# Patient Record
Sex: Female | Born: 1970
Health system: Southern US, Community
[De-identification: ages and names within clinical notes are randomized; demographics above are authoritative.]

## PROBLEM LIST (undated history)

## (undated) ENCOUNTER — Ambulatory Visit: Admission: EM | Payer: No Typology Code available for payment source | Source: Home / Self Care

## (undated) DIAGNOSIS — N809 Endometriosis, unspecified: Secondary | ICD-10-CM

## (undated) DIAGNOSIS — F1010 Alcohol abuse, uncomplicated: Secondary | ICD-10-CM

## (undated) DIAGNOSIS — K219 Gastro-esophageal reflux disease without esophagitis: Secondary | ICD-10-CM

## (undated) DIAGNOSIS — F3281 Premenstrual dysphoric disorder: Secondary | ICD-10-CM

## (undated) DIAGNOSIS — M1990 Unspecified osteoarthritis, unspecified site: Secondary | ICD-10-CM

## (undated) DIAGNOSIS — Z22322 Carrier or suspected carrier of Methicillin resistant Staphylococcus aureus: Secondary | ICD-10-CM

## (undated) DIAGNOSIS — F419 Anxiety disorder, unspecified: Secondary | ICD-10-CM

## (undated) DIAGNOSIS — F319 Bipolar disorder, unspecified: Secondary | ICD-10-CM

## (undated) DIAGNOSIS — J189 Pneumonia, unspecified organism: Secondary | ICD-10-CM

## (undated) DIAGNOSIS — N800 Endometriosis of uterus: Secondary | ICD-10-CM

## (undated) DIAGNOSIS — Z8489 Family history of other specified conditions: Secondary | ICD-10-CM

## (undated) DIAGNOSIS — A749 Chlamydial infection, unspecified: Secondary | ICD-10-CM

## (undated) DIAGNOSIS — N393 Stress incontinence (female) (male): Secondary | ICD-10-CM

## (undated) DIAGNOSIS — N8003 Adenomyosis of the uterus: Secondary | ICD-10-CM

## (undated) DIAGNOSIS — R7303 Prediabetes: Secondary | ICD-10-CM

## (undated) HISTORY — DX: Chlamydial infection, unspecified: A74.9

## (undated) HISTORY — DX: Carrier or suspected carrier of methicillin resistant Staphylococcus aureus: Z22.322

## (undated) HISTORY — DX: Premenstrual dysphoric disorder: F32.81

## (undated) HISTORY — PX: MOHS SURGERY: SUR867

## (undated) HISTORY — DX: Endometriosis of uterus: N80.0

## (undated) HISTORY — DX: Endometriosis, unspecified: N80.9

## (undated) HISTORY — DX: Bipolar disorder, unspecified: F31.9

## (undated) HISTORY — PX: OTHER SURGICAL HISTORY: SHX169

## (undated) HISTORY — DX: Adenomyosis of the uterus: N80.03

## (undated) HISTORY — DX: Stress incontinence (female) (male): N39.3

## (undated) HISTORY — DX: Anxiety disorder, unspecified: F41.9

## (undated) HISTORY — DX: Alcohol abuse, uncomplicated: F10.10

## (undated) HISTORY — PX: NEUROMA SURGERY: SHX722

---

## 1995-11-10 HISTORY — PX: DILATION AND CURETTAGE OF UTERUS: SHX78

## 1998-02-12 ENCOUNTER — Encounter: Admission: RE | Admit: 1998-02-12 | Discharge: 1998-02-12 | Payer: Self-pay | Admitting: Obstetrics & Gynecology

## 1998-02-13 ENCOUNTER — Other Ambulatory Visit: Admission: RE | Admit: 1998-02-13 | Discharge: 1998-02-13 | Payer: Self-pay | Admitting: Obstetrics & Gynecology

## 1998-05-28 ENCOUNTER — Emergency Department (HOSPITAL_COMMUNITY): Admission: EM | Admit: 1998-05-28 | Discharge: 1998-05-28 | Payer: Self-pay | Admitting: Emergency Medicine

## 1998-07-05 ENCOUNTER — Emergency Department (HOSPITAL_COMMUNITY): Admission: EM | Admit: 1998-07-05 | Discharge: 1998-07-05 | Payer: Self-pay | Admitting: Emergency Medicine

## 1998-07-07 ENCOUNTER — Ambulatory Visit (HOSPITAL_COMMUNITY): Admission: RE | Admit: 1998-07-07 | Discharge: 1998-07-07 | Payer: Self-pay | Admitting: Emergency Medicine

## 1998-07-09 ENCOUNTER — Ambulatory Visit (HOSPITAL_COMMUNITY): Admission: RE | Admit: 1998-07-09 | Discharge: 1998-07-09 | Payer: Self-pay | Admitting: Obstetrics & Gynecology

## 1998-07-10 ENCOUNTER — Inpatient Hospital Stay (HOSPITAL_COMMUNITY): Admission: AD | Admit: 1998-07-10 | Discharge: 1998-07-10 | Payer: Self-pay | Admitting: Obstetrics and Gynecology

## 1998-07-11 ENCOUNTER — Ambulatory Visit (HOSPITAL_COMMUNITY): Admission: RE | Admit: 1998-07-11 | Discharge: 1998-07-11 | Payer: Self-pay | Admitting: Obstetrics & Gynecology

## 1998-07-18 ENCOUNTER — Ambulatory Visit (HOSPITAL_COMMUNITY): Admission: RE | Admit: 1998-07-18 | Discharge: 1998-07-18 | Payer: Self-pay | Admitting: Obstetrics & Gynecology

## 1998-07-18 ENCOUNTER — Ambulatory Visit (HOSPITAL_COMMUNITY): Admission: RE | Admit: 1998-07-18 | Discharge: 1998-07-18 | Payer: Self-pay | Admitting: Obstetrics

## 1998-07-29 ENCOUNTER — Ambulatory Visit (HOSPITAL_COMMUNITY): Admission: RE | Admit: 1998-07-29 | Discharge: 1998-07-29 | Payer: Self-pay | Admitting: Obstetrics

## 1998-08-29 ENCOUNTER — Encounter: Admission: RE | Admit: 1998-08-29 | Discharge: 1998-08-29 | Payer: Self-pay | Admitting: Obstetrics

## 1998-08-29 ENCOUNTER — Other Ambulatory Visit: Admission: RE | Admit: 1998-08-29 | Discharge: 1998-08-29 | Payer: Self-pay | Admitting: Obstetrics

## 1998-08-29 ENCOUNTER — Encounter (HOSPITAL_COMMUNITY): Admission: RE | Admit: 1998-08-29 | Discharge: 1998-11-27 | Payer: Self-pay | Admitting: Obstetrics

## 1998-09-25 ENCOUNTER — Encounter: Admission: RE | Admit: 1998-09-25 | Discharge: 1998-09-25 | Payer: Self-pay | Admitting: Obstetrics & Gynecology

## 1998-10-01 ENCOUNTER — Encounter: Admission: RE | Admit: 1998-10-01 | Discharge: 1998-10-01 | Payer: Self-pay | Admitting: Hematology and Oncology

## 1998-10-23 ENCOUNTER — Encounter: Admission: RE | Admit: 1998-10-23 | Discharge: 1998-10-23 | Payer: Self-pay | Admitting: Obstetrics & Gynecology

## 1998-10-23 ENCOUNTER — Ambulatory Visit (HOSPITAL_COMMUNITY): Admission: RE | Admit: 1998-10-23 | Discharge: 1998-10-23 | Payer: Self-pay | Admitting: Obstetrics & Gynecology

## 1998-11-09 HISTORY — PX: OTHER SURGICAL HISTORY: SHX169

## 1998-11-09 HISTORY — PX: LEEP: SHX91

## 1998-11-23 ENCOUNTER — Inpatient Hospital Stay (HOSPITAL_COMMUNITY): Admission: AD | Admit: 1998-11-23 | Discharge: 1998-11-23 | Payer: Self-pay | Admitting: *Deleted

## 1998-11-25 ENCOUNTER — Ambulatory Visit (HOSPITAL_COMMUNITY): Admission: RE | Admit: 1998-11-25 | Discharge: 1998-11-25 | Payer: Self-pay | Admitting: Obstetrics

## 1998-12-25 ENCOUNTER — Encounter: Admission: RE | Admit: 1998-12-25 | Discharge: 1998-12-25 | Payer: Self-pay | Admitting: Obstetrics & Gynecology

## 1999-01-12 ENCOUNTER — Inpatient Hospital Stay (HOSPITAL_COMMUNITY): Admission: AD | Admit: 1999-01-12 | Discharge: 1999-01-12 | Payer: Self-pay | Admitting: Obstetrics & Gynecology

## 1999-01-15 ENCOUNTER — Encounter: Admission: RE | Admit: 1999-01-15 | Discharge: 1999-01-15 | Payer: Self-pay | Admitting: Obstetrics & Gynecology

## 1999-01-22 ENCOUNTER — Encounter: Admission: RE | Admit: 1999-01-22 | Discharge: 1999-01-22 | Payer: Self-pay | Admitting: Obstetrics & Gynecology

## 1999-02-12 ENCOUNTER — Encounter: Admission: RE | Admit: 1999-02-12 | Discharge: 1999-02-12 | Payer: Self-pay | Admitting: Obstetrics & Gynecology

## 1999-02-13 ENCOUNTER — Inpatient Hospital Stay (HOSPITAL_COMMUNITY): Admission: AD | Admit: 1999-02-13 | Discharge: 1999-02-13 | Payer: Self-pay | Admitting: Obstetrics & Gynecology

## 1999-02-19 ENCOUNTER — Encounter: Admission: RE | Admit: 1999-02-19 | Discharge: 1999-02-19 | Payer: Self-pay | Admitting: Obstetrics & Gynecology

## 1999-02-26 ENCOUNTER — Encounter (HOSPITAL_COMMUNITY): Admission: RE | Admit: 1999-02-26 | Discharge: 1999-03-05 | Payer: Self-pay | Admitting: Obstetrics & Gynecology

## 1999-02-26 ENCOUNTER — Encounter: Admission: RE | Admit: 1999-02-26 | Discharge: 1999-02-26 | Payer: Self-pay | Admitting: Obstetrics & Gynecology

## 1999-03-04 ENCOUNTER — Inpatient Hospital Stay (HOSPITAL_COMMUNITY): Admission: AD | Admit: 1999-03-04 | Discharge: 1999-03-07 | Payer: Self-pay | Admitting: *Deleted

## 1999-03-10 ENCOUNTER — Inpatient Hospital Stay (HOSPITAL_COMMUNITY): Admission: AD | Admit: 1999-03-10 | Discharge: 1999-03-10 | Payer: Self-pay | Admitting: Obstetrics

## 1999-07-16 ENCOUNTER — Emergency Department (HOSPITAL_COMMUNITY): Admission: EM | Admit: 1999-07-16 | Discharge: 1999-07-16 | Payer: Self-pay | Admitting: Emergency Medicine

## 1999-07-30 ENCOUNTER — Inpatient Hospital Stay (HOSPITAL_COMMUNITY): Admission: AD | Admit: 1999-07-30 | Discharge: 1999-07-30 | Payer: Self-pay | Admitting: *Deleted

## 1999-07-31 ENCOUNTER — Encounter: Admission: RE | Admit: 1999-07-31 | Discharge: 1999-07-31 | Payer: Self-pay | Admitting: Obstetrics

## 1999-08-25 ENCOUNTER — Ambulatory Visit (HOSPITAL_COMMUNITY): Admission: RE | Admit: 1999-08-25 | Discharge: 1999-08-25 | Payer: Self-pay | Admitting: General Surgery

## 1999-12-10 ENCOUNTER — Encounter: Payer: Self-pay | Admitting: Emergency Medicine

## 1999-12-10 ENCOUNTER — Emergency Department (HOSPITAL_COMMUNITY): Admission: EM | Admit: 1999-12-10 | Discharge: 1999-12-10 | Payer: Self-pay | Admitting: Emergency Medicine

## 1999-12-16 ENCOUNTER — Ambulatory Visit (HOSPITAL_BASED_OUTPATIENT_CLINIC_OR_DEPARTMENT_OTHER): Admission: RE | Admit: 1999-12-16 | Discharge: 1999-12-16 | Payer: Self-pay | Admitting: Orthopedic Surgery

## 2000-04-29 ENCOUNTER — Encounter: Payer: Self-pay | Admitting: Internal Medicine

## 2000-04-29 ENCOUNTER — Ambulatory Visit (HOSPITAL_COMMUNITY): Admission: RE | Admit: 2000-04-29 | Discharge: 2000-04-29 | Payer: Self-pay | Admitting: Internal Medicine

## 2000-05-28 ENCOUNTER — Encounter: Payer: Self-pay | Admitting: Emergency Medicine

## 2000-05-28 ENCOUNTER — Emergency Department (HOSPITAL_COMMUNITY): Admission: EM | Admit: 2000-05-28 | Discharge: 2000-05-28 | Payer: Self-pay | Admitting: Emergency Medicine

## 2000-10-13 ENCOUNTER — Emergency Department (HOSPITAL_COMMUNITY): Admission: EM | Admit: 2000-10-13 | Discharge: 2000-10-14 | Payer: Self-pay | Admitting: Emergency Medicine

## 2000-10-14 ENCOUNTER — Encounter: Payer: Self-pay | Admitting: Emergency Medicine

## 2000-10-14 ENCOUNTER — Emergency Department (HOSPITAL_COMMUNITY): Admission: EM | Admit: 2000-10-14 | Discharge: 2000-10-14 | Payer: Self-pay | Admitting: Internal Medicine

## 2000-10-14 ENCOUNTER — Encounter: Payer: Self-pay | Admitting: Internal Medicine

## 2000-12-10 ENCOUNTER — Encounter: Admission: RE | Admit: 2000-12-10 | Discharge: 2000-12-10 | Payer: Self-pay | Admitting: Obstetrics & Gynecology

## 2000-12-11 ENCOUNTER — Other Ambulatory Visit: Admission: RE | Admit: 2000-12-11 | Discharge: 2000-12-11 | Payer: Self-pay | Admitting: Obstetrics & Gynecology

## 2001-01-25 ENCOUNTER — Encounter (INDEPENDENT_AMBULATORY_CARE_PROVIDER_SITE_OTHER): Payer: Self-pay

## 2001-01-25 ENCOUNTER — Encounter: Payer: Self-pay | Admitting: General Surgery

## 2001-01-25 ENCOUNTER — Ambulatory Visit (HOSPITAL_COMMUNITY): Admission: RE | Admit: 2001-01-25 | Discharge: 2001-01-25 | Payer: Self-pay | Admitting: General Surgery

## 2001-09-07 ENCOUNTER — Emergency Department (HOSPITAL_COMMUNITY): Admission: EM | Admit: 2001-09-07 | Discharge: 2001-09-07 | Payer: Self-pay | Admitting: Emergency Medicine

## 2001-09-07 ENCOUNTER — Encounter: Payer: Self-pay | Admitting: Emergency Medicine

## 2002-01-30 ENCOUNTER — Encounter: Payer: Self-pay | Admitting: *Deleted

## 2002-01-30 ENCOUNTER — Ambulatory Visit (HOSPITAL_COMMUNITY): Admission: RE | Admit: 2002-01-30 | Discharge: 2002-01-30 | Payer: Self-pay | Admitting: *Deleted

## 2002-02-20 ENCOUNTER — Emergency Department (HOSPITAL_COMMUNITY): Admission: AC | Admit: 2002-02-20 | Discharge: 2002-02-20 | Payer: Self-pay

## 2002-02-20 ENCOUNTER — Encounter: Payer: Self-pay | Admitting: General Surgery

## 2002-03-23 ENCOUNTER — Encounter: Payer: Self-pay | Admitting: *Deleted

## 2002-03-23 ENCOUNTER — Ambulatory Visit (HOSPITAL_COMMUNITY): Admission: RE | Admit: 2002-03-23 | Discharge: 2002-03-23 | Payer: Self-pay | Admitting: *Deleted

## 2002-10-05 ENCOUNTER — Emergency Department (HOSPITAL_COMMUNITY): Admission: EM | Admit: 2002-10-05 | Discharge: 2002-10-05 | Payer: Self-pay | Admitting: Emergency Medicine

## 2002-10-05 ENCOUNTER — Encounter: Payer: Self-pay | Admitting: Emergency Medicine

## 2003-01-31 ENCOUNTER — Encounter: Admission: RE | Admit: 2003-01-31 | Discharge: 2003-01-31 | Payer: Self-pay | Admitting: Family Medicine

## 2003-02-09 ENCOUNTER — Other Ambulatory Visit: Admission: RE | Admit: 2003-02-09 | Discharge: 2003-02-09 | Payer: Self-pay | Admitting: Family Medicine

## 2003-02-09 ENCOUNTER — Encounter: Admission: RE | Admit: 2003-02-09 | Discharge: 2003-02-09 | Payer: Self-pay | Admitting: Family Medicine

## 2003-02-23 ENCOUNTER — Ambulatory Visit (HOSPITAL_COMMUNITY): Admission: RE | Admit: 2003-02-23 | Discharge: 2003-02-23 | Payer: Self-pay | Admitting: Family Medicine

## 2003-07-31 ENCOUNTER — Emergency Department (HOSPITAL_COMMUNITY): Admission: EM | Admit: 2003-07-31 | Discharge: 2003-08-01 | Payer: Self-pay | Admitting: Emergency Medicine

## 2003-12-11 ENCOUNTER — Encounter: Admission: RE | Admit: 2003-12-11 | Discharge: 2003-12-11 | Payer: Self-pay | Admitting: Family Medicine

## 2004-01-11 ENCOUNTER — Encounter: Admission: RE | Admit: 2004-01-11 | Discharge: 2004-01-11 | Payer: Self-pay | Admitting: Family Medicine

## 2004-01-18 ENCOUNTER — Encounter: Admission: RE | Admit: 2004-01-18 | Discharge: 2004-01-18 | Payer: Self-pay | Admitting: Sports Medicine

## 2004-03-13 ENCOUNTER — Encounter: Admission: RE | Admit: 2004-03-13 | Discharge: 2004-03-13 | Payer: Self-pay | Admitting: Family Medicine

## 2004-03-26 ENCOUNTER — Emergency Department (HOSPITAL_COMMUNITY): Admission: EM | Admit: 2004-03-26 | Discharge: 2004-03-27 | Payer: Self-pay | Admitting: Emergency Medicine

## 2004-04-15 ENCOUNTER — Encounter: Admission: RE | Admit: 2004-04-15 | Discharge: 2004-04-15 | Payer: Self-pay | Admitting: Sports Medicine

## 2004-04-29 ENCOUNTER — Encounter: Admission: RE | Admit: 2004-04-29 | Discharge: 2004-04-29 | Payer: Self-pay | Admitting: Family Medicine

## 2004-05-13 ENCOUNTER — Encounter: Admission: RE | Admit: 2004-05-13 | Discharge: 2004-05-13 | Payer: Self-pay | Admitting: Sports Medicine

## 2004-05-27 ENCOUNTER — Encounter: Admission: RE | Admit: 2004-05-27 | Discharge: 2004-05-27 | Payer: Self-pay | Admitting: Family Medicine

## 2004-06-24 ENCOUNTER — Encounter: Admission: RE | Admit: 2004-06-24 | Discharge: 2004-06-24 | Payer: Self-pay | Admitting: Family Medicine

## 2004-07-29 ENCOUNTER — Ambulatory Visit: Payer: Self-pay | Admitting: Family Medicine

## 2004-08-12 ENCOUNTER — Ambulatory Visit: Payer: Self-pay | Admitting: Family Medicine

## 2004-08-26 ENCOUNTER — Ambulatory Visit: Payer: Self-pay | Admitting: Family Medicine

## 2004-09-16 ENCOUNTER — Ambulatory Visit: Payer: Self-pay | Admitting: Family Medicine

## 2004-12-10 ENCOUNTER — Ambulatory Visit: Payer: Self-pay | Admitting: Family Medicine

## 2005-03-09 LAB — CONVERTED CEMR LAB: Pap Smear: NORMAL

## 2005-03-17 ENCOUNTER — Ambulatory Visit: Payer: Self-pay | Admitting: Family Medicine

## 2005-03-27 ENCOUNTER — Ambulatory Visit: Payer: Self-pay | Admitting: Family Medicine

## 2005-06-12 ENCOUNTER — Emergency Department (HOSPITAL_COMMUNITY): Admission: EM | Admit: 2005-06-12 | Discharge: 2005-06-13 | Payer: Self-pay | Admitting: Emergency Medicine

## 2005-06-15 ENCOUNTER — Ambulatory Visit: Payer: Self-pay | Admitting: Family Medicine

## 2005-06-15 ENCOUNTER — Emergency Department (HOSPITAL_COMMUNITY): Admission: EM | Admit: 2005-06-15 | Discharge: 2005-06-15 | Payer: Self-pay | Admitting: *Deleted

## 2005-06-17 ENCOUNTER — Ambulatory Visit: Payer: Self-pay | Admitting: Family Medicine

## 2006-05-28 ENCOUNTER — Ambulatory Visit: Payer: Self-pay | Admitting: Family Medicine

## 2006-08-13 ENCOUNTER — Ambulatory Visit: Payer: Self-pay | Admitting: Family Medicine

## 2006-11-17 ENCOUNTER — Ambulatory Visit: Payer: Self-pay | Admitting: Family Medicine

## 2006-11-17 ENCOUNTER — Emergency Department (HOSPITAL_COMMUNITY): Admission: EM | Admit: 2006-11-17 | Discharge: 2006-11-17 | Payer: Self-pay | Admitting: Emergency Medicine

## 2006-12-08 ENCOUNTER — Ambulatory Visit: Payer: Self-pay | Admitting: Family Medicine

## 2006-12-15 ENCOUNTER — Ambulatory Visit: Payer: Self-pay | Admitting: Family Medicine

## 2006-12-29 ENCOUNTER — Ambulatory Visit: Payer: Self-pay | Admitting: Family Medicine

## 2007-01-06 DIAGNOSIS — K219 Gastro-esophageal reflux disease without esophagitis: Secondary | ICD-10-CM

## 2007-01-06 DIAGNOSIS — N938 Other specified abnormal uterine and vaginal bleeding: Secondary | ICD-10-CM | POA: Insufficient documentation

## 2007-01-06 DIAGNOSIS — F410 Panic disorder [episodic paroxysmal anxiety] without agoraphobia: Secondary | ICD-10-CM | POA: Insufficient documentation

## 2007-01-06 DIAGNOSIS — K2210 Ulcer of esophagus without bleeding: Secondary | ICD-10-CM | POA: Insufficient documentation

## 2007-01-06 DIAGNOSIS — N949 Unspecified condition associated with female genital organs and menstrual cycle: Secondary | ICD-10-CM

## 2007-01-06 DIAGNOSIS — F411 Generalized anxiety disorder: Secondary | ICD-10-CM | POA: Insufficient documentation

## 2007-01-06 DIAGNOSIS — L719 Rosacea, unspecified: Secondary | ICD-10-CM | POA: Insufficient documentation

## 2007-01-26 ENCOUNTER — Ambulatory Visit: Payer: Self-pay | Admitting: Sports Medicine

## 2007-01-26 DIAGNOSIS — F319 Bipolar disorder, unspecified: Secondary | ICD-10-CM | POA: Insufficient documentation

## 2007-02-10 ENCOUNTER — Telehealth: Payer: Self-pay | Admitting: *Deleted

## 2007-02-10 ENCOUNTER — Ambulatory Visit: Payer: Self-pay | Admitting: Family Medicine

## 2007-02-10 ENCOUNTER — Encounter (INDEPENDENT_AMBULATORY_CARE_PROVIDER_SITE_OTHER): Payer: Self-pay | Admitting: Family Medicine

## 2007-02-10 LAB — CONVERTED CEMR LAB
ALT: 15 units/L (ref 0–35)
AST: 29 units/L (ref 0–37)
Albumin: 4.9 g/dL (ref 3.5–5.2)
Alkaline Phosphatase: 42 units/L (ref 39–117)
Basophils Absolute: 0 10*3/uL (ref 0.0–0.1)
CO2: 23 meq/L (ref 19–32)
Calcium: 9.4 mg/dL (ref 8.4–10.5)
Lymphocytes Relative: 27 % (ref 12–46)
Neutro Abs: 5 10*3/uL (ref 1.7–7.7)
Protein, U semiquant: NEGATIVE
RDW: 12.9 % (ref 11.5–14.0)
Sodium: 140 meq/L (ref 135–145)
Specific Gravity, Urine: 1.025
Total Protein: 7.3 g/dL (ref 6.0–8.3)
pH: 5.5

## 2007-02-11 ENCOUNTER — Encounter (INDEPENDENT_AMBULATORY_CARE_PROVIDER_SITE_OTHER): Payer: Self-pay | Admitting: Family Medicine

## 2007-02-11 ENCOUNTER — Telehealth (INDEPENDENT_AMBULATORY_CARE_PROVIDER_SITE_OTHER): Payer: Self-pay | Admitting: Family Medicine

## 2007-02-11 LAB — CONVERTED CEMR LAB: Amylase: 35 units/L (ref 0–105)

## 2007-02-14 ENCOUNTER — Encounter (INDEPENDENT_AMBULATORY_CARE_PROVIDER_SITE_OTHER): Payer: Self-pay | Admitting: Family Medicine

## 2007-02-14 ENCOUNTER — Ambulatory Visit (HOSPITAL_COMMUNITY): Admission: RE | Admit: 2007-02-14 | Discharge: 2007-02-14 | Payer: Self-pay | Admitting: Family Medicine

## 2007-02-15 ENCOUNTER — Telehealth (INDEPENDENT_AMBULATORY_CARE_PROVIDER_SITE_OTHER): Payer: Self-pay | Admitting: Family Medicine

## 2007-02-17 ENCOUNTER — Telehealth: Payer: Self-pay | Admitting: *Deleted

## 2007-02-18 ENCOUNTER — Ambulatory Visit: Payer: Self-pay | Admitting: Family Medicine

## 2007-02-22 ENCOUNTER — Emergency Department (HOSPITAL_COMMUNITY): Admission: EM | Admit: 2007-02-22 | Discharge: 2007-02-22 | Payer: Self-pay | Admitting: Family Medicine

## 2007-03-11 ENCOUNTER — Ambulatory Visit: Payer: Self-pay | Admitting: Family Medicine

## 2007-03-11 ENCOUNTER — Telehealth: Payer: Self-pay | Admitting: *Deleted

## 2007-03-28 ENCOUNTER — Telehealth (INDEPENDENT_AMBULATORY_CARE_PROVIDER_SITE_OTHER): Payer: Self-pay | Admitting: Family Medicine

## 2007-03-30 ENCOUNTER — Telehealth: Payer: Self-pay | Admitting: *Deleted

## 2007-04-06 ENCOUNTER — Ambulatory Visit: Payer: Self-pay | Admitting: Family Medicine

## 2007-04-08 ENCOUNTER — Encounter: Payer: Self-pay | Admitting: Family Medicine

## 2007-04-08 ENCOUNTER — Encounter: Payer: Self-pay | Admitting: Internal Medicine

## 2007-04-08 ENCOUNTER — Ambulatory Visit: Payer: Self-pay | Admitting: Family Medicine

## 2007-04-08 LAB — CONVERTED CEMR LAB
Bilirubin Urine: NEGATIVE
Glucose, Urine, Semiquant: NEGATIVE
Nitrite: NEGATIVE
Protein, U semiquant: NEGATIVE
Specific Gravity, Urine: 1.025
Urobilinogen, UA: NEGATIVE
WBC Urine, dipstick: NEGATIVE

## 2007-04-15 ENCOUNTER — Encounter (INDEPENDENT_AMBULATORY_CARE_PROVIDER_SITE_OTHER): Payer: Self-pay | Admitting: *Deleted

## 2007-07-08 ENCOUNTER — Ambulatory Visit: Payer: Self-pay | Admitting: Family Medicine

## 2007-07-08 DIAGNOSIS — G450 Vertebro-basilar artery syndrome: Secondary | ICD-10-CM | POA: Insufficient documentation

## 2007-07-08 LAB — CONVERTED CEMR LAB
Bilirubin Urine: NEGATIVE
Glucose, Urine, Semiquant: NEGATIVE
Nitrite: NEGATIVE
Specific Gravity, Urine: >=1.03
WBC Urine, dipstick: NEGATIVE
pH: 6

## 2007-07-15 ENCOUNTER — Ambulatory Visit: Payer: Self-pay | Admitting: Family Medicine

## 2007-07-18 ENCOUNTER — Encounter: Payer: Self-pay | Admitting: Family Medicine

## 2007-07-20 ENCOUNTER — Encounter: Payer: Self-pay | Admitting: Family Medicine

## 2007-07-22 ENCOUNTER — Encounter: Payer: Self-pay | Admitting: Family Medicine

## 2007-08-08 ENCOUNTER — Ambulatory Visit: Payer: Self-pay | Admitting: Family Medicine

## 2007-08-08 ENCOUNTER — Ambulatory Visit: Payer: Self-pay | Admitting: Gynecology

## 2007-08-08 LAB — CONVERTED CEMR LAB: Beta hcg, urine, semiquantitative: NEGATIVE

## 2007-08-10 ENCOUNTER — Ambulatory Visit: Payer: Self-pay | Admitting: Family Medicine

## 2007-08-12 ENCOUNTER — Ambulatory Visit (HOSPITAL_COMMUNITY): Admission: RE | Admit: 2007-08-12 | Discharge: 2007-08-12 | Payer: Self-pay | Admitting: Gynecology

## 2007-08-22 ENCOUNTER — Telehealth: Payer: Self-pay | Admitting: Family Medicine

## 2007-08-30 ENCOUNTER — Encounter: Payer: Self-pay | Admitting: Psychology

## 2007-09-13 ENCOUNTER — Telehealth: Payer: Self-pay | Admitting: Psychology

## 2007-09-14 ENCOUNTER — Emergency Department: Payer: Self-pay | Admitting: Emergency Medicine

## 2007-09-16 ENCOUNTER — Ambulatory Visit: Payer: Self-pay | Admitting: Internal Medicine

## 2007-09-16 DIAGNOSIS — G500 Trigeminal neuralgia: Secondary | ICD-10-CM | POA: Insufficient documentation

## 2007-10-12 ENCOUNTER — Ambulatory Visit: Payer: Self-pay | Admitting: Family Medicine

## 2007-11-06 ENCOUNTER — Emergency Department (HOSPITAL_COMMUNITY): Admission: EM | Admit: 2007-11-06 | Discharge: 2007-11-06 | Payer: Self-pay | Admitting: Emergency Medicine

## 2007-11-10 HISTORY — PX: ABDOMINAL HYSTERECTOMY: SHX81

## 2007-11-19 ENCOUNTER — Emergency Department (HOSPITAL_COMMUNITY): Admission: EM | Admit: 2007-11-19 | Discharge: 2007-11-19 | Payer: Self-pay | Admitting: Emergency Medicine

## 2007-11-25 ENCOUNTER — Ambulatory Visit: Payer: Self-pay | Admitting: Family Medicine

## 2007-11-29 LAB — CONVERTED CEMR LAB
BUN: 11 mg/dL (ref 6–23)
CO2: 28 meq/L (ref 19–32)
Calcium: 9.1 mg/dL (ref 8.4–10.5)
Chloride: 108 meq/L (ref 96–112)
Cholesterol: 134 mg/dL (ref 0–200)
Creatinine, Ser: 0.9 mg/dL (ref 0.4–1.2)
Glucose, Bld: 84 mg/dL (ref 70–99)
Potassium: 4.4 meq/L (ref 3.5–5.1)
Triglycerides: 33 mg/dL (ref 0–149)
VLDL: 7 mg/dL (ref 0–40)

## 2007-11-30 ENCOUNTER — Ambulatory Visit: Payer: Self-pay | Admitting: Psychology

## 2007-11-30 ENCOUNTER — Telehealth: Payer: Self-pay | Admitting: Family Medicine

## 2007-12-08 ENCOUNTER — Encounter: Payer: Self-pay | Admitting: Family Medicine

## 2007-12-22 ENCOUNTER — Ambulatory Visit: Payer: Self-pay | Admitting: Family Medicine

## 2007-12-22 LAB — CONVERTED CEMR LAB: Nitrite: NEGATIVE

## 2007-12-23 ENCOUNTER — Encounter: Payer: Self-pay | Admitting: Family Medicine

## 2007-12-28 ENCOUNTER — Telehealth: Payer: Self-pay | Admitting: Psychology

## 2008-01-07 ENCOUNTER — Encounter: Payer: Self-pay | Admitting: Family Medicine

## 2008-01-17 ENCOUNTER — Telehealth: Payer: Self-pay | Admitting: Family Medicine

## 2008-01-26 ENCOUNTER — Telehealth: Payer: Self-pay | Admitting: Family Medicine

## 2008-01-30 ENCOUNTER — Emergency Department (HOSPITAL_COMMUNITY): Admission: EM | Admit: 2008-01-30 | Discharge: 2008-01-30 | Payer: Self-pay | Admitting: Emergency Medicine

## 2008-02-15 ENCOUNTER — Telehealth: Payer: Self-pay | Admitting: Psychology

## 2008-03-09 ENCOUNTER — Telehealth: Payer: Self-pay | Admitting: Family Medicine

## 2008-03-15 ENCOUNTER — Encounter: Payer: Self-pay | Admitting: Family Medicine

## 2008-03-25 ENCOUNTER — Emergency Department (HOSPITAL_COMMUNITY): Admission: EM | Admit: 2008-03-25 | Discharge: 2008-03-25 | Payer: Self-pay | Admitting: Family Medicine

## 2008-03-26 ENCOUNTER — Telehealth: Payer: Self-pay | Admitting: Family Medicine

## 2008-03-28 ENCOUNTER — Ambulatory Visit: Payer: Self-pay | Admitting: Family Medicine

## 2008-03-28 LAB — CONVERTED CEMR LAB
Beta hcg, urine, semiquantitative: NEGATIVE
Blood in Urine, dipstick: NEGATIVE
Glucose, Urine, Semiquant: NEGATIVE
Ketones, urine, test strip: NEGATIVE
Nitrite: NEGATIVE
Specific Gravity, Urine: 1.02
WBC Urine, dipstick: NEGATIVE
pH: 7

## 2008-03-29 ENCOUNTER — Emergency Department: Payer: Self-pay | Admitting: Emergency Medicine

## 2008-04-11 ENCOUNTER — Ambulatory Visit: Payer: Self-pay | Admitting: Family Medicine

## 2008-04-12 ENCOUNTER — Telehealth: Payer: Self-pay | Admitting: Family Medicine

## 2008-04-16 ENCOUNTER — Telehealth: Payer: Self-pay | Admitting: Family Medicine

## 2008-04-16 ENCOUNTER — Encounter: Payer: Self-pay | Admitting: Family Medicine

## 2008-04-17 ENCOUNTER — Encounter: Payer: Self-pay | Admitting: Family Medicine

## 2008-04-20 ENCOUNTER — Ambulatory Visit: Payer: Self-pay | Admitting: Family Medicine

## 2008-04-21 ENCOUNTER — Encounter: Payer: Self-pay | Admitting: Family Medicine

## 2008-04-30 ENCOUNTER — Ambulatory Visit: Payer: Self-pay | Admitting: Gynecology

## 2008-05-07 ENCOUNTER — Ambulatory Visit: Payer: Self-pay | Admitting: Family Medicine

## 2008-05-07 ENCOUNTER — Encounter (INDEPENDENT_AMBULATORY_CARE_PROVIDER_SITE_OTHER): Payer: Self-pay | Admitting: *Deleted

## 2008-05-07 LAB — CONVERTED CEMR LAB: Rapid Strep: NEGATIVE

## 2008-05-29 ENCOUNTER — Emergency Department (HOSPITAL_COMMUNITY): Admission: EM | Admit: 2008-05-29 | Discharge: 2008-05-30 | Payer: Self-pay | Admitting: Emergency Medicine

## 2008-08-07 ENCOUNTER — Telehealth: Payer: Self-pay | Admitting: Psychology

## 2008-08-20 ENCOUNTER — Emergency Department (HOSPITAL_COMMUNITY): Admission: EM | Admit: 2008-08-20 | Discharge: 2008-08-20 | Payer: Self-pay | Admitting: Emergency Medicine

## 2008-09-18 ENCOUNTER — Ambulatory Visit: Payer: Self-pay | Admitting: Obstetrics and Gynecology

## 2008-10-09 ENCOUNTER — Ambulatory Visit: Payer: Self-pay | Admitting: Obstetrics and Gynecology

## 2008-10-11 ENCOUNTER — Emergency Department: Payer: Self-pay | Admitting: Internal Medicine

## 2008-10-25 ENCOUNTER — Encounter (INDEPENDENT_AMBULATORY_CARE_PROVIDER_SITE_OTHER): Payer: Self-pay | Admitting: *Deleted

## 2008-11-21 ENCOUNTER — Ambulatory Visit: Payer: Self-pay | Admitting: Family Medicine

## 2008-11-24 ENCOUNTER — Ambulatory Visit: Payer: Self-pay | Admitting: *Deleted

## 2008-11-24 ENCOUNTER — Telehealth (INDEPENDENT_AMBULATORY_CARE_PROVIDER_SITE_OTHER): Payer: Self-pay | Admitting: *Deleted

## 2008-11-24 DIAGNOSIS — A4902 Methicillin resistant Staphylococcus aureus infection, unspecified site: Secondary | ICD-10-CM | POA: Insufficient documentation

## 2008-11-28 ENCOUNTER — Emergency Department: Payer: Self-pay

## 2008-11-29 ENCOUNTER — Telehealth: Payer: Self-pay | Admitting: Family Medicine

## 2008-12-06 ENCOUNTER — Telehealth: Payer: Self-pay | Admitting: Family Medicine

## 2008-12-07 ENCOUNTER — Ambulatory Visit: Payer: Self-pay | Admitting: Family Medicine

## 2008-12-07 LAB — CONVERTED CEMR LAB
Bilirubin Urine: NEGATIVE
Urobilinogen, UA: 0.2

## 2008-12-12 ENCOUNTER — Ambulatory Visit: Payer: Self-pay | Admitting: Family Medicine

## 2009-01-26 ENCOUNTER — Emergency Department (HOSPITAL_COMMUNITY): Admission: EM | Admit: 2009-01-26 | Discharge: 2009-01-26 | Payer: Self-pay | Admitting: Emergency Medicine

## 2009-01-29 ENCOUNTER — Ambulatory Visit: Payer: Self-pay | Admitting: Family Medicine

## 2009-01-29 DIAGNOSIS — G43019 Migraine without aura, intractable, without status migrainosus: Secondary | ICD-10-CM | POA: Insufficient documentation

## 2009-02-12 ENCOUNTER — Encounter: Payer: Self-pay | Admitting: Family Medicine

## 2009-02-15 ENCOUNTER — Encounter: Admission: RE | Admit: 2009-02-15 | Discharge: 2009-02-15 | Payer: Self-pay | Admitting: Psychiatry

## 2009-02-27 ENCOUNTER — Emergency Department (HOSPITAL_COMMUNITY): Admission: EM | Admit: 2009-02-27 | Discharge: 2009-02-27 | Payer: Self-pay | Admitting: Family Medicine

## 2009-05-09 ENCOUNTER — Emergency Department (HOSPITAL_COMMUNITY): Admission: EM | Admit: 2009-05-09 | Discharge: 2009-05-09 | Payer: Self-pay | Admitting: Family Medicine

## 2009-08-02 ENCOUNTER — Ambulatory Visit: Payer: Self-pay | Admitting: Family Medicine

## 2009-09-02 ENCOUNTER — Ambulatory Visit: Payer: Self-pay | Admitting: Family Medicine

## 2009-12-03 ENCOUNTER — Telehealth: Payer: Self-pay | Admitting: Family Medicine

## 2009-12-08 ENCOUNTER — Emergency Department: Payer: Self-pay | Admitting: Unknown Physician Specialty

## 2009-12-11 ENCOUNTER — Ambulatory Visit: Payer: Self-pay | Admitting: Family Medicine

## 2009-12-17 ENCOUNTER — Ambulatory Visit: Payer: Self-pay | Admitting: Family Medicine

## 2009-12-18 LAB — CONVERTED CEMR LAB
AST: 22 units/L (ref 0–37)
Alkaline Phosphatase: 45 units/L (ref 39–117)
Basophils Absolute: 0.1 10*3/uL (ref 0.0–0.1)
Bilirubin, Direct: 0 mg/dL (ref 0.0–0.3)
Calcium: 8.9 mg/dL (ref 8.4–10.5)
Chloride: 106 meq/L (ref 96–112)
Creatinine, Ser: 0.7 mg/dL (ref 0.4–1.2)
Glucose, Bld: 92 mg/dL (ref 70–99)
HCT: 39.4 % (ref 36.0–46.0)
Hemoglobin: 13.3 g/dL (ref 12.0–15.0)
Lymphs Abs: 2 10*3/uL (ref 0.7–4.0)
Monocytes Absolute: 0.5 10*3/uL (ref 0.1–1.0)
Monocytes Relative: 7 % (ref 3.0–12.0)
Neutro Abs: 4 10*3/uL (ref 1.4–7.7)
Neutrophils Relative %: 59.8 % (ref 43.0–77.0)
Platelets: 283 10*3/uL (ref 150.0–400.0)
Potassium: 4.1 meq/L (ref 3.5–5.1)
RDW: 12.3 % (ref 11.5–14.6)
Sodium: 141 meq/L (ref 135–145)
Total Bilirubin: 0.5 mg/dL (ref 0.3–1.2)

## 2009-12-26 ENCOUNTER — Telehealth: Payer: Self-pay | Admitting: Family Medicine

## 2010-01-08 ENCOUNTER — Encounter (INDEPENDENT_AMBULATORY_CARE_PROVIDER_SITE_OTHER): Payer: Self-pay | Admitting: *Deleted

## 2010-01-08 ENCOUNTER — Ambulatory Visit: Payer: Self-pay | Admitting: Family Medicine

## 2010-01-09 ENCOUNTER — Encounter: Payer: Self-pay | Admitting: Family Medicine

## 2010-01-09 ENCOUNTER — Ambulatory Visit: Payer: Self-pay | Admitting: Family Medicine

## 2010-01-13 ENCOUNTER — Encounter: Payer: Self-pay | Admitting: Family Medicine

## 2010-01-13 DIAGNOSIS — D239 Other benign neoplasm of skin, unspecified: Secondary | ICD-10-CM

## 2010-01-13 HISTORY — DX: Other benign neoplasm of skin, unspecified: D23.9

## 2010-01-16 ENCOUNTER — Telehealth: Payer: Self-pay | Admitting: Family Medicine

## 2010-01-23 ENCOUNTER — Ambulatory Visit: Payer: Self-pay | Admitting: Otolaryngology

## 2010-01-27 ENCOUNTER — Ambulatory Visit: Payer: Self-pay | Admitting: Family Medicine

## 2010-02-03 ENCOUNTER — Telehealth: Payer: Self-pay | Admitting: Family Medicine

## 2010-03-10 ENCOUNTER — Emergency Department: Payer: Self-pay | Admitting: Emergency Medicine

## 2010-03-10 ENCOUNTER — Encounter: Payer: Self-pay | Admitting: Family Medicine

## 2010-04-09 ENCOUNTER — Telehealth: Payer: Self-pay | Admitting: Family Medicine

## 2010-04-18 ENCOUNTER — Telehealth: Payer: Self-pay | Admitting: Family Medicine

## 2010-04-22 ENCOUNTER — Telehealth: Payer: Self-pay | Admitting: Family Medicine

## 2010-04-23 ENCOUNTER — Emergency Department (HOSPITAL_COMMUNITY): Admission: EM | Admit: 2010-04-23 | Discharge: 2010-04-23 | Payer: Self-pay | Admitting: Family Medicine

## 2010-04-28 ENCOUNTER — Telehealth: Payer: Self-pay | Admitting: Family Medicine

## 2010-05-07 ENCOUNTER — Ambulatory Visit: Payer: Self-pay | Admitting: Psychology

## 2010-05-13 ENCOUNTER — Encounter: Payer: Self-pay | Admitting: Family Medicine

## 2010-05-14 ENCOUNTER — Emergency Department (HOSPITAL_COMMUNITY): Admission: EM | Admit: 2010-05-14 | Discharge: 2010-05-14 | Payer: Self-pay | Admitting: Emergency Medicine

## 2010-05-16 ENCOUNTER — Ambulatory Visit: Payer: Self-pay | Admitting: Family Medicine

## 2010-05-20 ENCOUNTER — Ambulatory Visit: Payer: Self-pay | Admitting: Psychology

## 2010-06-03 ENCOUNTER — Ambulatory Visit: Payer: Self-pay | Admitting: Psychology

## 2010-06-03 ENCOUNTER — Telehealth: Payer: Self-pay | Admitting: Family Medicine

## 2010-06-06 ENCOUNTER — Encounter: Payer: Self-pay | Admitting: Family Medicine

## 2010-06-10 ENCOUNTER — Ambulatory Visit: Payer: Self-pay | Admitting: Psychology

## 2010-06-20 ENCOUNTER — Encounter: Payer: Self-pay | Admitting: Family Medicine

## 2010-06-20 ENCOUNTER — Ambulatory Visit: Payer: Self-pay | Admitting: Gastroenterology

## 2010-06-24 ENCOUNTER — Ambulatory Visit: Payer: Self-pay | Admitting: Psychology

## 2010-06-30 ENCOUNTER — Encounter: Payer: Self-pay | Admitting: Family Medicine

## 2010-07-05 ENCOUNTER — Ambulatory Visit: Payer: Self-pay | Admitting: Internal Medicine

## 2010-07-07 ENCOUNTER — Ambulatory Visit: Payer: Self-pay | Admitting: Family Medicine

## 2010-07-07 LAB — CONVERTED CEMR LAB
Glucose, Urine, Semiquant: NEGATIVE
pH: 5

## 2010-07-08 ENCOUNTER — Telehealth: Payer: Self-pay | Admitting: Family Medicine

## 2010-07-10 ENCOUNTER — Telehealth: Payer: Self-pay | Admitting: Family Medicine

## 2010-07-11 ENCOUNTER — Ambulatory Visit: Payer: Self-pay | Admitting: Family Medicine

## 2010-07-19 ENCOUNTER — Emergency Department: Payer: Self-pay | Admitting: Emergency Medicine

## 2010-07-21 ENCOUNTER — Telehealth: Payer: Self-pay | Admitting: Family Medicine

## 2010-07-21 ENCOUNTER — Emergency Department (HOSPITAL_COMMUNITY): Admission: EM | Admit: 2010-07-21 | Discharge: 2010-07-21 | Payer: Self-pay | Admitting: Family Medicine

## 2010-08-01 ENCOUNTER — Ambulatory Visit: Payer: Self-pay | Admitting: Family Medicine

## 2010-08-05 ENCOUNTER — Telehealth: Payer: Self-pay | Admitting: Family Medicine

## 2010-08-05 ENCOUNTER — Ambulatory Visit: Payer: Self-pay | Admitting: Psychology

## 2010-08-09 ENCOUNTER — Emergency Department (HOSPITAL_COMMUNITY): Admission: EM | Admit: 2010-08-09 | Discharge: 2010-08-10 | Payer: Self-pay | Admitting: Emergency Medicine

## 2010-08-11 ENCOUNTER — Telehealth: Payer: Self-pay | Admitting: Family Medicine

## 2010-08-12 ENCOUNTER — Ambulatory Visit: Payer: Self-pay | Admitting: Psychology

## 2010-08-19 ENCOUNTER — Ambulatory Visit: Payer: Self-pay

## 2010-08-19 ENCOUNTER — Ambulatory Visit: Payer: Self-pay | Admitting: Family Medicine

## 2010-08-19 ENCOUNTER — Telehealth: Payer: Self-pay | Admitting: Family Medicine

## 2010-08-19 LAB — CONVERTED CEMR LAB
Bilirubin Urine: NEGATIVE
Protein, U semiquant: NEGATIVE
Specific Gravity, Urine: 1.01
pH: 6

## 2010-08-20 ENCOUNTER — Encounter: Payer: Self-pay | Admitting: Family Medicine

## 2010-08-22 ENCOUNTER — Telehealth: Payer: Self-pay | Admitting: Family Medicine

## 2010-08-28 ENCOUNTER — Telehealth: Payer: Self-pay | Admitting: Psychology

## 2010-09-20 ENCOUNTER — Ambulatory Visit: Payer: Self-pay | Admitting: Family Medicine

## 2010-09-20 LAB — CONVERTED CEMR LAB
Glucose, Urine, Semiquant: NEGATIVE
Specific Gravity, Urine: 1.015

## 2010-10-04 ENCOUNTER — Emergency Department (HOSPITAL_COMMUNITY): Admission: EM | Admit: 2010-10-04 | Discharge: 2010-10-04 | Payer: Self-pay | Admitting: Family Medicine

## 2010-10-10 ENCOUNTER — Telehealth: Payer: Self-pay | Admitting: Family Medicine

## 2010-10-10 ENCOUNTER — Ambulatory Visit: Payer: Self-pay | Admitting: Surgery

## 2010-10-22 ENCOUNTER — Ambulatory Visit: Payer: Self-pay | Admitting: Surgery

## 2010-10-29 ENCOUNTER — Emergency Department: Payer: Self-pay | Admitting: Emergency Medicine

## 2010-11-21 ENCOUNTER — Ambulatory Visit
Admission: RE | Admit: 2010-11-21 | Discharge: 2010-11-21 | Payer: Self-pay | Source: Home / Self Care | Attending: Family Medicine | Admitting: Family Medicine

## 2010-11-21 DIAGNOSIS — J309 Allergic rhinitis, unspecified: Secondary | ICD-10-CM | POA: Insufficient documentation

## 2010-11-21 DIAGNOSIS — E669 Obesity, unspecified: Secondary | ICD-10-CM | POA: Insufficient documentation

## 2010-11-26 ENCOUNTER — Ambulatory Visit
Admission: RE | Admit: 2010-11-26 | Discharge: 2010-11-26 | Payer: Self-pay | Source: Home / Self Care | Attending: Family Medicine | Admitting: Family Medicine

## 2010-11-26 DIAGNOSIS — M79609 Pain in unspecified limb: Secondary | ICD-10-CM | POA: Insufficient documentation

## 2010-11-26 DIAGNOSIS — R0789 Other chest pain: Secondary | ICD-10-CM | POA: Insufficient documentation

## 2010-11-27 ENCOUNTER — Telehealth: Payer: Self-pay | Admitting: Family Medicine

## 2010-11-28 ENCOUNTER — Encounter: Payer: Self-pay | Admitting: Family Medicine

## 2010-12-09 ENCOUNTER — Encounter: Payer: Self-pay | Admitting: Family Medicine

## 2010-12-11 NOTE — Progress Notes (Signed)
Summary: GI Referral  Phone Note Call from Patient Call back at Home Phone [phone]   Caller: Patient Call For: Dr. Patsy Lager Summary of Call: Patient is requesting a GI referral.  She has been to the ER (?[facility]) and they suggested that she see GI.  She wanted to see someone in [place] but has to wait 6-8 weeks for an appointment.  She is willing now to see a [place] [facility] MD.  Please send referral. Initial call taken by: Delilah Shan CMA Duncan Dull),  April 28, 2010 11:28 AM  Follow-up for Phone Call        Why does she want to see GI? I have to have a reason to make consult Follow-up by: Hannah Beat MD,  April 28, 2010 11:38 AM  Additional Follow-up for Phone Call Additional follow up Details #1::        I have faxed for ER notes.  I think she went to [facility].  Hopefully, they will arrive soon.  I think it is GERD.Lugene Fuquay CMA Duncan Dull)  April 28, 2010 11:40 AM   New Problems: GERD (ICD-530.81) NAUSEA (ICD-787.02)   Additional Follow-up for Phone Call Additional follow up Details #2::    Heather - please ask the patient herself why she wants GI consult and what is going on.     Patient symptoms are alot of pain in stomach, loss of appetite, nausous, vomiting, throat and neck burning.  Patient went to urgent care and they told her she needed to follow up with GI.Consuello Masse CMA    New Problems: GERD (ICD-530.81) NAUSEA (ICD-787.02)

## 2010-12-11 NOTE — Miscellaneous (Signed)
  Clinical Lists Changes  Orders: Added new Service order of TD Toxoids IM 7 YR + (16109) - Signed Added new Service order of Admin 1st Vaccine (60454) - Signed Observations: Added new observation of TD BOOST VIS: 09/27/07 version given January 08, 2010. (01/08/2010 16:05) Added new observation of TD BOOSTERLO: [id number] (01/08/2010 16:05) Added new observation of TD BOOST EXP: 01/04/2012 (01/08/2010 16:05) Added new observation of TD BOOSTERBY: Heather Woodard CMA (AAMA) (01/08/2010 16:05) Added new observation of TD BOOSTERRT: IM (01/08/2010 16:05) Added new observation of TDBOOSTERDSE: 0.5 ml (01/08/2010 16:05) Added new observation of TD BOOSTERMF: GlaxoSmithKline (01/08/2010 16:05) Added new observation of TD BOOST SIT: left deltoid (01/08/2010 16:05) Added new observation of TD BOOSTER: Td (01/08/2010 16:05)      Immunizations Administered:  Tetanus Vaccine:    Vaccine Type: Td    Site: left deltoid    Mfr: GlaxoSmithKline    Dose: 0.5 ml    Route: IM    Given by: Benny Lennert CMA (AAMA)    Exp. Date: 01/04/2012    Lot #: [id number]    VIS given: 09/27/07 version given January 08, 2010.

## 2010-12-11 NOTE — Progress Notes (Signed)
Summary: call a nurse   Phone Note Call from Patient   Summary of Call: Triage Record Num: [id number] Operator: Caswell Corwin Patient Name: Lacey Decker Call Date & Time: 08/18/2010 7:14:01PM Patient Phone: [phone] PCP: [name] Patient Gender: Female PCP Fax : [phone] Patient DOB: 1971-07-20 Practice Name: [facility] MRN: [id number] Reason for Call: OFFICE NOTE! Pt calling that she was in the E/R 1 week ago for a bladder infection. Cipro 500 mg 1 Po BID was prescribed and it is making her sick. Making her nauseated and she has a H/A and is upsetting her stomach. Has had BM x 4 today. Also has a yeast inf. and got Diflucan today. Last voided at  ~ 1900. Triaged urinary sx and home care and call back inst given. Inst to hold Cipro tonight and take Diflucan and to call the office in the AM. PLEASE CALL PT @ [phone]. OFFICE NOTE! TY! Protocol(s) Used: Urinary Symptoms - Female Recommended Outcome per Protocol: See Provider within 24 hours Reason for Outcome: Genital itching, burning or redness Care Advice: Take showers rather than baths. Do not use bubble baths or bath oils. Do not douche. Avoid feminine hygiene sprays or deodorants.  ~  ~ Keep perineum clean and dry.  ~ Call provider if you develop flank or low back pain, fever, generally feel sick.  ~ Some individuals benefit from fluids such as cranberry juice. Keep the area around the genitals clean using nonbacterial, nonperfumed mild soap (Dove for sensitive skin, Neutrogena, Pears or Basis) or no soap using just warm water.  ~ Apply cool compresses to affected area(s) for 20 minutes 4 to 6 times daily to help relieve itching or try a baking soda sitz bath with 4-5 tablespoons of baking soda in enough water to cover the genital area.  ~ Most adults need to drink 6-10 eight-ounce glasses (1.2-2.0 liters) of fluids per day unless previously told to limit fluid intake for other medical reasons. Limit  fluids that contain caffeine, sugar or alcohol. Urine will be a very light yellow color when you drink enough fluids.  ~ 08/18/2010 7 Initial call taken by: Melody Comas,  August 19, 2010 10:52 AM
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
## 2010-12-11 NOTE — Progress Notes (Signed)
Summary: needs instructions for using mouthwash  Phone Note Call from Patient Call back at Home Phone [phone]   Caller: Patient Call For: Dr. Dayton Martes  Summary of Call: Patient picked up her FIRST-DUKES MOUTHWASH  SUSP. She says that there were no instructions attached and the pharmacist told her to contact our office for instructions how how to use this. Please advise.  Initial call taken by: Melody Comas,  July 08, 2010 9:54 AM  Follow-up for Phone Call        5 to 10 cc swish and spit 4 times a day until gone. Follow-up by: [name] MD,  July 08, 2010 10:43 AM  Additional Follow-up for Phone Call Additional follow up Details #1::        Patient advised.Consuello Masse CMA   Additional Follow-up by: Benny Lennert CMA Duncan Dull),  July 08, 2010 10:56 AM
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
## 2010-12-11 NOTE — Progress Notes (Signed)
Summary: abn bleeding  Phone Note Call from Patient   Caller: Patient Summary of Call: Pt called reporting that she had a hysterectomy over a year ago and today had bleeding with intercourse,  she has not had sex in a year, as her husband has been in prison and got out today.  I advised her to call her gyn for evaluation. Initial call taken by: Lowella Petties CMA,  December 26, 2009 4:55 PM  Follow-up for Phone Call        it is a little unusual to have bleeding after intercourse. Surgical incisions should be healed.   OK to f/u with GYN. Will CC Dr. Ermalene Searing - up to her if she would like to eval or have GYN see pt. Follow-up by: Hannah Beat MD,  December 26, 2009 5:05 PM  Additional Follow-up for Phone Call Additional follow up Details #1::        Likely vagainal abraision, but if continuing to occur with intercourse, needs to be using lubrication like KY...make appt for exam.  Additional Follow-up by: [name] MD,  December 27, 2009 8:19 AM    Additional Follow-up for Phone Call Additional follow up Details #2::    Patient advised.Consuello Masse CMA    Follow-up by: Benny Lennert CMA Duncan Dull),  December 27, 2009 9:36 AM

## 2010-12-11 NOTE — Medication Information (Signed)
Summary: PA and Approval for Nexium  PA and Approval for Nexium   Imported By: Maryln Gottron 12/03/2010 11:26:25  _____________________________________________________________________  External Attachment:    Type:   Image     Comment:   External Document
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
## 2010-12-11 NOTE — Procedures (Signed)
Summary: Upper GI Endoscopy by Dr.Paul Oh,[facility]  Upper GI Endoscopy by Dr.Paul Oh,[facility]   Imported By: Beau Fanny 06/25/2010 16:23:52  _____________________________________________________________________  External Attachment:    Type:   Image     Comment:   External Document

## 2010-12-11 NOTE — Letter (Signed)
Summary: GI[facility]  [facility]   Imported By: Sherian Rein 08/01/2010 09:49:31  _____________________________________________________________________  External Attachment:    Type:   Image     Comment:   External Document

## 2010-12-11 NOTE — Progress Notes (Signed)
Summary: wants urine cx results, diflucan  Phone Note Call from Patient Call back at Home Phone [phone]   Caller: Patient Call For: [name] MD Summary of Call: Pt is calling asking for urine culture results.  Also, she says she still has the yeast infection and is asking if she can have a refill on diflucan.  [facility]. Initial call taken by: Lowella Petties CMA,  August 22, 2010 9:02 AM  Follow-up for Phone Call        Patient advised of results difulcan called to pharmacy Follow-up by: Benny Lennert CMA Duncan Dull),  August 22, 2010 9:21 AM     Prior Medications: ACIPHEX 20 MG TBEC (RABEPRAZOLE SODIUM) 1 once daily ATIVAN 1 MG TABS (LORAZEPAM) as needed SERTRALINE HCL 25 MG TABS (SERTRALINE HCL) 1/2 tab by mouth daily x 1 week then 1 tab by mouth daily MELOXICAM 15 MG TABS (MELOXICAM) 1 tab by mouth daily as needed back pain, headhace. Current Allergies: ! CODEINE ! MORPHINE ! BACTRIM ! CLARITHROMYCIN (CLARITHROMYCIN) CODEINE PHOSPHATE (CODEINE PHOSPHATE) * PANTOPRAZOLE MORPHINE SULFATE (MORPHINE SULFATE)

## 2010-12-11 NOTE — Progress Notes (Signed)
Summary: Pt cancelled appt....  Phone Note Call from Patient   Caller: Patient Call For: [name] MD Summary of Call: Pt called, had a ROV appt scheduled for Friday, Sept 2,2011. Says her tongue is clear, looks normal.  Did not want to come in because she is better.  Appt cancelled- fyi to Dr. Ermalene Searing.Daine Gip  July 10, 2010 2:48 PM  Initial call taken by: Daine Gip,  July 10, 2010 2:48 PM
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
## 2010-12-11 NOTE — Progress Notes (Signed)
Summary: wants mamm results  Phone Note Call from Patient Call back at Home Phone [phone]   Caller: Patient Call For: [name] MD/ Dr. Patsy Lager Summary of Call: Patient is really concerned about her mammogram results. I explained to her that Dr. Ermalene Searing has not been in this week to review the results and that she would be back on monday. She wanted me to ask if you could sign off on it so that she could get results before the weekend. Please advise.  Initial call taken by: Melody Comas,  January 16, 2010 9:44 AM  Follow-up for Phone Call        normal mammogram  call patient update prevention list Follow-up by: Hannah Beat MD,  January 16, 2010 9:47 AM  Additional Follow-up for Phone Call Additional follow up Details #1::        Patient advised and prevention updated Additional Follow-up by: Benny Lennert CMA Duncan Dull),  January 16, 2010 10:15 AM

## 2010-12-11 NOTE — Assessment & Plan Note (Signed)
Summary: DISCUSS LOSING WEIGHT/CLE   Allergies: 1)  ! Codeine 2)  ! Morphine 3)  ! Bactrim 4)  ! Clarithromycin (Clarithromycin) 5)  Codeine Phosphate (Codeine Phosphate) 6)  * Pantoprazole 7)  Morphine Sulfate (Morphine Sulfate)   Complete Medication List: 1)  Aciphex 20 Mg Tbec (Rabeprazole sodium) .Marland Kitchen.. 1 once daily 2)  Ativan 1 Mg Tabs (Lorazepam) .... As needed 3)  Macrobid 100 Mg Caps (Nitrofurantoin monohyd macro) .Marland Kitchen.. 1 tab by mouth two times a day x 7 days  Other Orders: No Charge Patient Arrived (NCPA0) (NCPA0)   Orders Added: 1)  No Charge Patient Arrived (NCPA0) [NCPA0]

## 2010-12-11 NOTE — Progress Notes (Signed)
Summary: Wants dermatology referral  Phone Note Call from Patient Call back at [phone]   Caller: Patient Call For: Lacey Nora MD Summary of Call: Pt request a referral to dermatologist.  Pt has rosacea and the itching is really bothering her and wants body check for moles to rule out skin cancer.  Pt was not familiar with a dermatologist and wanted Dr. Daphine Deutscher advice on who to see. Please advise.  Initial call taken by: Lewanda Rife LPN,  December 03, 2009 2:19 PM  Follow-up for Phone Call        Derm referral sent   Additional Follow-up for Phone Call Additional follow up Details #1::        Appt made with Dr Gwen Pounds on 01/13/2010 at 2:45pm. Carlton Adam  December 04, 2009 4:27 PM  Additional Follow-up by: Carlton Adam,  December 04, 2009 4:28 PM

## 2010-12-11 NOTE — Assessment & Plan Note (Signed)
Summary: SINUS INFECTION/RBH   Vital Signs:  Patient profile:   40 year old female Weight:      177 pounds Temp:     98.4 degrees F oral Pulse rate:   72 / minute Pulse rhythm:   regular BP sitting:   108 / 74  (left arm) Cuff size:   regular  Vitals Entered By: Sydell Axon LPN (December 11, 2009 12:36 PM) CC: ? Sinus infection, head congestion and has had nose bleeds   History of Present Illness: Pt here for acute appt. She has no fever or chills, she has headachre periorbitally and top of head, no ear pain, no rhinitis, nose is stuffy and hard to bleed , has really soreness in max area and behind the eyes, no St, has no cough. No SOB, noN/V. She has taken nothing.  Sxs began one week ago. First few days took allergy pills, Claritin she thinks.   Allergies: 1)  ! Codeine 2)  ! Morphine 3)  ! Bactrim 4)  Codeine Phosphate (Codeine Phosphate) 5)  Morphine Sulfate (Morphine Sulfate)  Physical Exam  General:  Well-developed,well-nourished,in no acute distress; alert,appropriate and cooperative throughout examination, facially hurting. Head:  Normocephalic and atraumatic without obvious abnormalities. No apparent alopecia or balding. Sinuses exquisitely tender in max distr. Eyes:  Conjunctiva clear bilaterally.  Ears:  External ear exam shows no significant lesions or deformities.  Otoscopic examination reveals clear canals, tympanic membranes are intact bilaterally without bulging, retraction, inflammation or discharge. Hearing is grossly normal bilaterally. Nose:  External nasal examination shows no deformity or inflammation. Nasal mucosa are pink and moist without lesions or exudates. Mouth:  Oral mucosa and oropharynx without lesions or exudates.  Teeth in good repair. Mildly dry, thick white PND. Neck:  no carotid bruit or thyromegaly roght posterior occipital node...0.5 cm mobile, nontender no cervical or supraclavicular lymphadenopathy  Lungs:  Normal respiratory effort,  chest expands symmetrically. Lungs are clear to auscultation, no crackles or wheezes. Heart:  Normal rate and regular rhythm. S1 and S2 normal without gallop, murmur, click, rub or other extra sounds.   Impression & Recommendations:  Problem # 1:  SINUSITIS- ACUTE-NOS (ICD-461.9) Assessment Unchanged  Recurrent. See instructions.  If approqach doesn't make signif impr, start Amox for 2 weeks. Her updated medication list for this problem includes:    Trimox 500 Mg Caps (Amoxicillin) ..... One tab by mouth three times a day  Instructed on treatment. Call if symptoms persist or worsen.   Complete Medication List: 1)  Baclofen 10 Mg Tabs (Baclofen) .Marland Kitchen.. 1 by mouth three times a day as needed muscle spasm and pain 2)  Ativan 2 Mg Tabs (Lorazepam) .... Take one by mouth daily as needed 3)  Trimox 500 Mg Caps (Amoxicillin) .... One tab by mouth three times a day  Patient Instructions: 1)  Take Guaifenesin by [facility] or [facility] and getting MUCOUS RELIEF EXPECTORANT (400mg ), take 11/2 tabs by mouth AM and NOON. 2)  Drink lots of fluids anytime taking Guaifenesin.  3)  Take Aleve 2 tabs after brfst and supper. 4)  If sxs worsen, use Amox. Prescriptions: TRIMOX 500 MG CAPS (AMOXICILLIN) one tab by mouth three times a day  #42 x 0   Entered and Authorized by:   Shaune Leeks MD   Signed by:   Shaune Leeks MD on 12/11/2009   Method used:   [facility] to        [facility]       [address]       [place], [place]  [address]       Ph: [phone] or [phone]       Fax: [phone]   RxID:   [id number]   Current Allergies (reviewed today): ! CODEINE ! MORPHINE ! BACTRIM CODEINE PHOSPHATE (CODEINE PHOSPHATE) MORPHINE SULFATE (MORPHINE SULFATE)

## 2010-12-11 NOTE — Letter (Signed)
Summary: Wellness Assessment Form/ALERT  Wellness Assessment Form/ALERT   Imported By: Lanelle Bal 05/20/2010 09:07:42  _____________________________________________________________________  External Attachment:    Type:   Image     Comment:   External Document
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
## 2010-12-11 NOTE — Progress Notes (Signed)
Summary: protonix  Phone Note Call from Patient Call back at Home Phone [phone]   Caller: Patient Call For: Lacey Nora MD Summary of Call: Patient is asking if she can have something other than protonix called in. She says that the protonix is making her feel dizzy and nauseated. She feels as if the room where spinning. She has used nexium in the past and it worked well for her. [facility]  Initial call taken by: Melody Comas,  April 22, 2010 9:05 AM  Follow-up for Phone Call        Patient advised.Consuello Masse CMA   Follow-up by: Benny Lennert CMA Duncan Dull),  April 22, 2010 9:50 AM   New Allergies: * PANTOPRAZOLE New/Updated Medications: NEXIUM 40 MG CPDR (ESOMEPRAZOLE MAGNESIUM) 1 tab by mouth daily New Allergies: * PANTOPRAZOLEPrescriptions: NEXIUM 40 MG CPDR (ESOMEPRAZOLE MAGNESIUM) 1 tab by mouth daily  #30 x 11   Entered and Authorized by:   Lacey Nora MD   Signed by:   Lacey Nora MD on 04/22/2010   Method used:   [facility]        [facility] (retail)       [address]       [place], [place]  [address]       Ph: [phone] or [phone]       Fax: [phone]   RxID:   [id number]
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
## 2010-12-11 NOTE — Progress Notes (Signed)
Summary: call a nurse  Phone Note Call from Patient   Call For: [name] MD Summary of Call: Triage Record Num: [id number] Operator: Vernell Barrier Patient Name: Lacey Decker Call Date & Time: 04/08/2010 9:47:32PM Patient Phone: [phone] PCP: Blima Rich Patient Gender: Female PCP Fax : [phone] Patient DOB: 06-18-1971 Practice Name: [facility] MRN: [id number] Reason for Call: LMP-Hysterectomy. Cathye calling regarding nasal stuffiness and coughing. Afebrile. Onset 04/04/10. Recommended Afrin for decongestant but stressed only 3 days. Dextromethorphan recommended for cough. Emergent Sx r/o per upper respiratory infection protocol. Home care advice given. Protocol(s) Used: Upper Respiratory Infections / Colds Recommended Outcome per Protocol: Provide Home/Self Care Reason for Outcome: All other situations Care Advice:  ~ Use a cool mist humidifier to moisten air. Be sure to clean according to manufacturer's instructions.  ~ Swollen lymph glands that persist more than two weeks must be evaluated by provider.  ~ Consider use of a saline nasal spray per package directions to help relieve nasal congestion. An annual influenza vaccination is recommended for all adults 68 years of age or older, those with chronic illness, or in contact with high risk individuals. An immunization for pneumonia is also recommended. The frail elderly may require a second pneumonia immunization 3 to 5 years after the first dose.  ~ Consider acetaminophen as directed on label or by pharmacist/provider for pain or fever. PRECAUTIONS: - Use only If there is no history of liver disease, alcoholism, or intake of three or more alcohol drinks per day. - If approved by provider when breastfeeding. - Do not exceed recommended dose or frequency.  ~ Call provider for appointment if hoarseness or cough has continued for 2 weeks or more, or if cough up blood, have difficulty swallowing, or have a  lump in the neck.  ~ Consider OTC decongestant (Sudafed, Drixoral) for relief of symptoms after checking with a provider, especially if there is a history o Initial call taken by: Melody Comas,  April 09, 2010 9:21 AM

## 2010-12-11 NOTE — Progress Notes (Signed)
Summary: motion sickness patches   Phone Note Call from Patient   Caller: Patient Call For: Dr. Patsy Lager Summary of Call: Patient is going on a cruise and is asking for motion sickness patches. [facility]ses [facility].  Initial call taken by: Melody Comas,  June 03, 2010 4:42 PM  Follow-up for Phone Call        I would recommend using dramamine instead if you will need anything at all. Motion sickness is very uncommon on cruises.  not unreasonable to have scopolamine patches. Wash hands well after handling - if you touch your eye, they will dilate for a week. Follow-up by: Hannah Beat MD,  June 03, 2010 5:00 PM  Additional Follow-up for Phone Call Additional follow up Details #1::        Patient advised.Consuello Masse CMA   Additional Follow-up by: Benny Lennert CMA Duncan Dull),  June 04, 2010 7:58 AM    New/Updated Medications: TRANSDERM-SCOP 1.5 MG PT72 (SCOPOLAMINE BASE) Apply 1 patch q 72 hours for motion sickness as needed Prescriptions: TRANSDERM-SCOP 1.5 MG PT72 (SCOPOLAMINE BASE) Apply 1 patch q 72 hours for motion sickness as needed  #7 x 0   Entered and Authorized by:   Hannah Beat MD   Signed by:   Hannah Beat MD on 06/03/2010   Method used:   [facility]        [facility] ([facility]ail)       [address]       [place], [place]  [address]       Ph: [phone] or [phone]       Fax: [phone]   RxID:   [id number]
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
## 2010-12-11 NOTE — Progress Notes (Signed)
Summary: prior auth needed for nexium  Phone Note From Pharmacy   [facility]  [address] [id number] Summary of Call: Prior Berkley Harvey is needed for nexium, form is on your desk. Initial call taken by: Lowella Petties CMA, AAMA,  November 27, 2010 11:21 AM     Appended Document: prior auth needed for nexium Prior auth given for nexium, approval letter placed on doctors desk for signature and scanning.
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
## 2010-12-11 NOTE — Progress Notes (Signed)
Summary: call a nurse   Phone Note Call from Patient   Call For: [name] MD Summary of Call: Triage Record Num: [id number] Operator: Joneen Boers Patient Name: Lacey Decker Call Date & Time: 07/19/2010 3:42:36PM Patient Phone: [phone] PCP: Blima Rich Patient Gender: Female PCP Fax : [phone] Patient DOB: July 26, 1971 Practice Name: [facility] MRN: [id number] Reason for Call: Pt returned from a cruise this morning after a lot of travel (15h in car after the cruise arrived ...)last week took a cruise, came home yesterday, went to ED, with dizziness & vertigo - MD gave a blood thinner shot due to 15h of travel ahead and suspicion of PE. Coags were nl but pt cont. to feel panicky - does have panic disorder and bipolar d/o but has no meds for the latter - took xanax at 1330 and it helped a little. Wore sea sickness patch behind her ear, removed 07/18/10 after cruise. Repeated flushes over face - assoc. w/eating. Last void 1430. Some blurred vision as well. Dizziness/vertigo protocol/previously evaluated and worsening symptoms interfereing with ability to carry out ADLs/see provider w/i24h. Pt described all symptoms to MD yesterday- no changes or worsening. She feels it's complicated by her panic attacks - is occasionally tearful, speaking rapidly. She says her SOB is like what she has w/her panic attacks. Pt will follow protocol advice today, if any changes or decline, will see provider today, otherwise w/u in 24h. Pt voiced understanding. Protocol(s) Used: Dizziness or Vertigo Recommended Outcome per Protocol: See Provider within 24 hours Reason for Outcome: Previously evaluated and worsening symptoms interfering with ability to carry out activities of daily living (ADLs) Care Advice: Call EMS 911 if patient develops confusion, decreased level of consciousness, chest pain, shortness of breath, or focal neurologic abnormalities such as facial droop or weakness  of one extremity.  ~  ~ DO NOT drive until condition evaluated.  ~ Protect from falling or other injury.  ~ Should not be alone, arrange for support (family Initial call taken by: Melody Comas,  July 21, 2010 10:32 AM

## 2010-12-11 NOTE — Progress Notes (Signed)
Summary: ? Reaction to Zoloft  Phone Note Call from Patient Call back at Home Phone [phone]   Caller: Patient Call For: [name] MD Summary of Call: Patient called crying stating that she just took her first Zoloft today and her heart is beating fast. Patient states that she got so upset at work and had to leave work and is on her way home.  After speaking with Dr. Ermalene Searing patient was informed that she should be okay and to stop the Zoloft. Advised patient to go home and rest and to do something that she enjoys doing to take her mind off of this. Patient was advised to update Dr. Ermalene Searing in 2-3 days to let her know how she is doing. Patient had calmed down some and stated that she would follow-up Dr. Daphine Deutscher advice. Initial call taken by: Sydell Axon LPN,  August 05, 2010 11:37 AM  Follow-up for Phone Call        Pt is very well known to me with severe anxiety and panic attacks...currently poor control.  Has been in ER and urgent care 3-4 times in last month. Recently saw her last week.  No cardiac concerns...likely panic attack..she is very anxious regarding medications.  She needs to take ativan daily.  PLEASE CALL AND TELL HER SHE CAN USE THIS DAILY TO TWICE DAILY IN NEXT FEW DAYS.  WIll await her update at end of week.  Follow-up by: [name] MD,  August 05, 2010 11:48 AM  Additional Follow-up for Phone Call Additional follow up Details #1::        Patient notified as instructed by telephone. Patient stated that she was doing better and will update Dr. Ermalene Searing at the end of the week. Additional Follow-up by: Sydell Axon LPN,  August 05, 2010 12:09 PM

## 2010-12-11 NOTE — Assessment & Plan Note (Signed)
Summary: BLADDER INF??/RCD   Vital Signs:  Patient profile:   40 year old female Weight:      192 pounds Temp:     97.7 degrees F Pulse rate:   71 / minute BP sitting:   122 / 68  (left arm) Cuff size:   large  Vitals Entered By: Lamar Sprinkles, CMA (September 20, 2010 10:08 AM) CC: urinary frequency & pain. Possible reaction to Cipro when given for last uti/SD   History of Present Illness: 40 yo here with ? UTI.  Treated partially for UTI last month.  Had ? reaction to cipro- nausea, abdominal pain.  Has signficant anxiety related to medicaitons and seeing physicians.  Two days ago, severe dysuria, increased urinary frequency. Taking Azo since last night, helping greatly with the dysuria.  No nausea, vomiting, or back pain. No noticible hematuria.  Current Medications (verified): 1)  Aciphex 20 Mg Tbec (Rabeprazole Sodium) .Marland Kitchen.. 1 Once Daily 2)  Ativan 1 Mg Tabs (Lorazepam) .... As Needed 3)  Macrobid 100 Mg Caps (Nitrofurantoin Monohyd Macro) .Marland Kitchen.. 1 Tab By Mouth Two Times A Day X 7 Days  Allergies (verified): 1)  ! Codeine 2)  ! Morphine 3)  ! Bactrim 4)  ! Clarithromycin (Clarithromycin) 5)  Codeine Phosphate (Codeine Phosphate) 6)  * Pantoprazole 7)  Morphine Sulfate (Morphine Sulfate)  Review of Systems      See HPI General:  Denies fever. GI:  Denies abdominal pain. GU:  Complains of dysuria and urinary frequency; denies hematuria and incontinence.  Physical Exam  General:  tearful, panicky VSS- afebrile, normotensive Abdomen:  no suprapubic tenderness Msk:  no CVA tendernss Psych:  anxious, tearful   Impression & Recommendations:  Problem # 1:  DYSURIA (ICD-788.1) Assessment New UA unable to read due to Azo but does have moderate blood. Will treat with Macrobid, continue Azo. Reassurance provided, pt very nervous about trying a new medication. Her updated medication list for this problem includes:    Macrobid 100 Mg Caps (Nitrofurantoin monohyd  macro) .Marland Kitchen... 1 tab by mouth two times a day x 7 days  Orders: UA Dipstick w/o Micro (manual) (56213) T-Culture, Urine ([id number])  Complete Medication List: 1)  Aciphex 20 Mg Tbec (Rabeprazole sodium) .Marland Kitchen.. 1 once daily 2)  Ativan 1 Mg Tabs (Lorazepam) .... As needed 3)  Macrobid 100 Mg Caps (Nitrofurantoin monohyd macro) .Marland Kitchen.. 1 tab by mouth two times a day x 7 days Prescriptions: MACROBID 100 MG CAPS (NITROFURANTOIN MONOHYD MACRO) 1 tab by mouth two times a day x 7 days  #14 x 0   Entered and Authorized by:   Ruthe Mannan MD   Signed by:   Ruthe Mannan MD on 09/20/2010   Method used:   [facility]        [facility] [id number]       [address]       [place], [place]  [address]       Ph: [phone] or [phone]       Fax: [phone]   RxID:   [id number]    Orders Added: 1)  UA Dipstick w/o Micro (manual) [81002] 2)  T-Culture, Urine [[id number]] 3)  Est. Patient Level IV [57322]    Laboratory Results   Urine Tests   Date/Time Reported: Lamar Sprinkles, CMA Difficult to read, pt on AZO.  September 20, 2010 10:15 AM   Routine Urinalysis   Color: orange Glucose: negative   (Normal Range: Negative) Spec. Gravity: 1.015   (  Normal Range: 1.003-1.035) Blood: moderate   (Normal Range: Negative)

## 2010-12-11 NOTE — Progress Notes (Signed)
Summary: ? Yeast infection  Phone Note Call from Patient Call back at Home Phone [phone]   Caller: Patient Call For: Dr. Patsy Lager Summary of Call: Patient is on Levaquin and has been on this since 03/21.  Now she says that she has a bad yeast infection.  Itching and burning and she sees a little blood when she wipes.  This has been going on since pretty much the whole time she has been on Levaquin.  Wants to know if she should come in or can we call in something for a yeast infection.  Please advise.  [facility] Initial call taken by: Linde Gillis CMA Duncan Dull),  February 03, 2010 10:10 AM  Follow-up for Phone Call        Phone Call Completed Follow-up by: Benny Lennert CMA Duncan Dull),  February 03, 2010 11:03 AM    New/Updated Medications: DIFLUCAN 150 MG TABS (FLUCONAZOLE) once daily Prescriptions: DIFLUCAN 150 MG TABS (FLUCONAZOLE) once daily  #1 x 1   Entered and Authorized by:   Hannah Beat MD   Signed by:   Hannah Beat MD on 02/03/2010   Method used:   [facility]        [facility] [facility]retail)       [address]       [place], [place]  [address]       Ph: [phone] or [phone]       Fax: [phone]   RxID:   [id number]

## 2010-12-11 NOTE — Progress Notes (Signed)
Summary: call a nurse   Phone Note Call from Patient   Summary of Call: Triage Record Num: [id number] Operator: Caswell Corwin Patient Name: Lacey Decker Call Date & Time: 08/09/2010 9:09:19PM Patient Phone: [phone] PCP: Blima Rich Patient Gender: Female PCP Fax : [phone] Patient DOB: 1971-07-14 Practice Name: [facility] MRN: [id number] Reason for Call: Pt calling that she is having severe bilateral hip and leg pain that started 08/08/10. Pt says she has had problems off and on with sciatic nerve on the left, but this is on both sides and she can't sleep because the pain. Last saw Dr. Lelon Frohlich for face pain and twitching eye. Back pain really started 08/08/10 in the night. Triaged back sx and and having leg pain and weakness. Has taken Mobic ealier, but not helping. Home care and call back inst given. Inst to go to [facility] for eval. Protocol(s) Used: Back Symptoms Recommended Outcome per Protocol: See ED Immediately Reason for Outcome: New onset back pain associated with numbness or weakness in both legs Care Advice:  ~ Protect the patient from falling or other harm.  ~ Another adult should drive.  ~ Do not give the patient anything to eat or drink. If safe transport is very difficult or not possible, consider calling an ambulance or transport service. This may not be covered by insurance and will be fee-for-service.  ~ Write down provider's name. List or place the following in a bag for transport with the patient: current prescription and/or nonprescription medications; alternative treatments, therapies and medications; and street drugs.  ~ Initial call taken by: Melody Comas,  August 11, 2010 8:47 AM

## 2010-12-11 NOTE — Consult Note (Signed)
Summary: [facility]  [facility]   Imported By: Lanelle Bal 06/26/2010 13:21:32  _____________________________________________________________________  External Attachment:    Type:   Image     Comment:   External Document

## 2010-12-11 NOTE — Progress Notes (Signed)
Summary: Treating Panic Disorder  Phone Note Call from Patient   Caller: Patient Call For: Spero Geralds, Psy.D. Summary of Call: Patient called to ask for help with Panic.  She has not been seen at the [facility] for some time.  She is requesting info on special programs that treat panic disorder.  Left her a VM.  Suggested there may be programs attached to universities like [facility] or [place].  Told her I would be happy to discuss by phone. Initial call taken by: Spero Geralds PsyD,  August 28, 2010 4:47 PM
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
## 2010-12-11 NOTE — Progress Notes (Signed)
Summary: not feeling well  Phone Note Call from Patient   Caller: Patient Call For: Lacey Nora MD Summary of Call: Pt states she was seen at [facility] for vertigo and given antivert and ativan.  She says she is not feeling well today, has numbness in right arm, feels woozy.  I advised her to go to urgent care or ER for evaluation.  She said she will go to urgent care at [facility].  Initial call taken by: Lowella Petties CMA,  July 21, 2010 11:09 AM
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
## 2010-12-11 NOTE — Progress Notes (Signed)
Summary: ? UTI  Phone Note Call from Patient   Caller: Patient Call For: [name] MD Summary of Call: Pt thinks she has another UTI- has frequency, burning and back pain.  No appts available today.  I suggested she go to [facility] today or saturday clinic tomorrow.  She said she will go to [facility] today. Initial call taken by: Lowella Petties CMA, AAMA,  October 10, 2010 10:12 AM

## 2010-12-11 NOTE — Progress Notes (Signed)
Summary: tastes salt  Phone Note Call from Patient Call back at Home Phone [phone]   Caller: Patient Call For: Lacey Decker Summary of Call: Pt states that for 4-5 days she didnt have any appetite but did make herself eat a little something.  Since last evening she has had the taste of salt in her mouth.  She has not eaten or drank anything today, still tastes the salt- a lot, like she has poured a salt shaker into her mouth.  She has been having some problems with indegestion and is asking if she should be referred to GI.  She has been taking prilosec every day, still has indegestion. Initial call taken by: Lowella Petties CMA,  April 18, 2010 11:13 AM  Follow-up for Phone Call        Slat taste in mouth no specific to any illness likely med SE. if continue GERD on prilosec recommend stronger med before referral to GI... Change to protonix daily. Call if still not improving in 2-4 weeks.  Follow-up by: Lacey Decker,  April 18, 2010 1:03 PM  Additional Follow-up for Phone Call Additional follow up Details #1::        Patient called back and said that she went to dentist last friday, she has blisters and mouth is swollen on the side where she got the crown. She had called the dentist on Monday of this week and was told to come in. When seen for follow up there she was told she had herpes virus in her mouth. She thinks that it is infected. Please advise.  Melody Comas  April 18, 2010 1:36 PM     Additional Follow-up for Phone Call Additional follow up Details #2::    Can treat oral herepes with valacyclovir..will send rx to pharm of choice. 1 day course will help it resolve faster.  Follow-up by: Lacey Decker,  April 18, 2010 1:58 PM  Additional Follow-up for Phone Call Additional follow up Details #3:: Details for Additional Follow-up Action Taken: Patient advised.Consuello Masse CMA  Additional Follow-up by: Benny Lennert CMA Duncan Dull),  April 18, 2010 2:03 PM  New/Updated  Medications: PANTOPRAZOLE SODIUM 40 MG TBEC (PANTOPRAZOLE SODIUM) Take 1 tablet by mouth once a day VALACYCLOVIR HCL 1 GM TABS (VALACYCLOVIR HCL) 2 tab by mouth two times a day x1 day Prescriptions: VALACYCLOVIR HCL 1 GM TABS (VALACYCLOVIR HCL) 2 tab by mouth two times a day x1 day  #4 x 1   Entered and Authorized by:   Lacey Decker   Signed by:   Lacey Decker on 04/18/2010   Method used:   [facility]        [facility] [id number]       [address]       [place], [place]  [address]       Ph: [phone] or [phone]       Fax: [phone]   RxID:   [id number] PANTOPRAZOLE SODIUM 40 MG TBEC (PANTOPRAZOLE SODIUM) Take 1 tablet by mouth once a day  #30 x 3   Entered and Authorized by:   Lacey Decker   Signed by:   Lacey Decker on 04/18/2010   Method used:   [facility]        [facility] (retail)       [address]       [place], [place]  [address]       Ph: [phone]  or [phone]       Fax: [phone]   RxID:   [id number]

## 2010-12-11 NOTE — Letter (Signed)
Summary: [facility]  [facility]   Imported By: Maryln Gottron 02/04/2010 11:14:38  _____________________________________________________________________  External Attachment:    Type:   Image     Comment:   External Document

## 2010-12-14 ENCOUNTER — Emergency Department: Payer: Self-pay | Admitting: Emergency Medicine

## 2010-12-15 ENCOUNTER — Ambulatory Visit (INDEPENDENT_AMBULATORY_CARE_PROVIDER_SITE_OTHER): Payer: 59 | Admitting: Psychology

## 2010-12-15 DIAGNOSIS — F411 Generalized anxiety disorder: Secondary | ICD-10-CM

## 2010-12-25 NOTE — Letter (Signed)
Summary: Historic Patient File  Historic Patient File   Imported By: Kassie Mends 12/16/2010 10:20:36  _____________________________________________________________________  External Attachment:    Type:   Image     Comment:   External Document

## 2010-12-31 ENCOUNTER — Ambulatory Visit (INDEPENDENT_AMBULATORY_CARE_PROVIDER_SITE_OTHER): Payer: 59 | Admitting: Psychology

## 2010-12-31 DIAGNOSIS — F411 Generalized anxiety disorder: Secondary | ICD-10-CM

## 2011-01-11 ENCOUNTER — Inpatient Hospital Stay (INDEPENDENT_AMBULATORY_CARE_PROVIDER_SITE_OTHER)
Admission: RE | Admit: 2011-01-11 | Discharge: 2011-01-11 | Disposition: A | Discharge status: Home / Self Care | Payer: 59 | Source: Ambulatory Visit | Attending: Emergency Medicine | Admitting: Emergency Medicine

## 2011-01-11 DIAGNOSIS — I7300 Raynaud's syndrome without gangrene: Secondary | ICD-10-CM

## 2011-01-19 ENCOUNTER — Ambulatory Visit (INDEPENDENT_AMBULATORY_CARE_PROVIDER_SITE_OTHER): Payer: 59 | Admitting: Psychology

## 2011-01-19 DIAGNOSIS — F411 Generalized anxiety disorder: Secondary | ICD-10-CM

## 2011-01-20 ENCOUNTER — Telehealth: Payer: Self-pay | Admitting: Family Medicine

## 2011-01-22 LAB — URINALYSIS, ROUTINE W REFLEX MICROSCOPIC
Glucose, UA: NEGATIVE mg/dL
Leukocytes, UA: NEGATIVE
pH: 5.5 (ref 5.0–8.0)

## 2011-01-22 LAB — POCT URINALYSIS DIPSTICK
Bilirubin Urine: NEGATIVE
Nitrite: NEGATIVE
Protein, ur: NEGATIVE mg/dL
Specific Gravity, Urine: 1.015 (ref 1.005–1.030)
Urobilinogen, UA: 0.2 mg/dL (ref 0.0–1.0)

## 2011-01-22 LAB — URINE MICROSCOPIC-ADD ON

## 2011-01-26 ENCOUNTER — Ambulatory Visit: Payer: 59 | Admitting: Psychology

## 2011-01-26 LAB — COMPREHENSIVE METABOLIC PANEL
Alkaline Phosphatase: 47 U/L (ref 39–117)
BUN: 10 mg/dL (ref 6–23)
Chloride: 110 mEq/L (ref 96–112)
Glucose, Bld: 82 mg/dL (ref 70–99)
Potassium: 3.6 mEq/L (ref 3.5–5.1)
Total Bilirubin: 0.5 mg/dL (ref 0.3–1.2)

## 2011-01-26 LAB — DIFFERENTIAL
Basophils Absolute: 0 10*3/uL (ref 0.0–0.1)
Basophils Relative: 0 % (ref 0–1)
Neutro Abs: 6 10*3/uL (ref 1.7–7.7)
Neutrophils Relative %: 65 % (ref 43–77)

## 2011-01-26 LAB — CBC
Platelets: 273 10*3/uL (ref 150–400)
RDW: 13.1 % (ref 11.5–15.5)
WBC: 9.2 10*3/uL (ref 4.0–10.5)

## 2011-01-27 NOTE — Progress Notes (Signed)
Summary: wants to take weight loss supplement  Phone Note Call from Patient Call back at Home Phone [phone] P PH     Caller: Patient Call For: [name] MD Summary of Call: Pt is asking if ok to take weight loss supplement called Numia- all natural ingredients according to ads.  She saw this on the internet and knows a few people who are taking it.                  Lowella Petties CMA, AAMA  January 20, 2011 2:56 PM   Follow-up for Phone Call        Please look up numia and print out active ingredients. Follow-up by: [name] MD,  January 20, 2011 3:09 PM  Additional Follow-up for Phone Call Additional follow up Details #1::        Notify pt there is fenphedrine in this product....caould cause SE of heart racing and worsen anxiety.  I do not think she should use this.  Can try sensa on food to highten smells of foods to help eat less.  Additional Follow-up by: [name] MD,  January 20, 2011 3:15 PM    Additional Follow-up for Phone Call Additional follow up Details #2::    Patient advised.Consuello Masse CMA   Follow-up by: Benny Lennert CMA Duncan Dull),  January 20, 2011 3:23 PM

## 2011-02-10 ENCOUNTER — Ambulatory Visit: Payer: 59 | Admitting: Psychology

## 2011-02-11 ENCOUNTER — Ambulatory Visit: Payer: 59 | Admitting: Family Medicine

## 2011-02-12 ENCOUNTER — Encounter: Payer: Self-pay | Admitting: Family Medicine

## 2011-02-12 LAB — HM MAMMOGRAPHY

## 2011-02-16 ENCOUNTER — Encounter: Payer: Self-pay | Admitting: Family Medicine

## 2011-02-16 ENCOUNTER — Ambulatory Visit (INDEPENDENT_AMBULATORY_CARE_PROVIDER_SITE_OTHER): Payer: 59 | Admitting: Family Medicine

## 2011-02-16 ENCOUNTER — Ambulatory Visit: Payer: 59 | Admitting: Psychology

## 2011-02-16 VITALS — BP 120/70 | HR 76 | Temp 98.6°F | Ht 66.0 in | Wt 205.8 lb

## 2011-02-16 DIAGNOSIS — L0291 Cutaneous abscess, unspecified: Secondary | ICD-10-CM

## 2011-02-16 NOTE — Assessment & Plan Note (Signed)
New.  Without associated cellulitis. Discussed typical course along with red flag symptoms. Advised warm compresses. The patient indicates understanding of these issues and agrees with the plan.

## 2011-02-16 NOTE — Progress Notes (Signed)
40 yo here for:  Bump on back of back leg.  Noticed it several weeks ago.  About the same size.  Non tender.  No erythema, warmth, fever or chills.  Has a h/o anxiety disorder and now very worried about this bump.  Pt denies h/o abscesses in past but EMR lists MRSA in her problem list.   The PMH, PSH, Social History, Family History, Medications, and allergies have been reviewed in [facility], and have been updated if relevant.   Review of Systems       See HPI General:  Denies fever. GI:  Denies nausea and vomiting.  Physical Exam BP 120/70  Pulse 76  Temp(Src) 98.6 F (37 C) (Oral)  Ht 5\' 6"  (1.676 m)  Wt 205 lb 12.8 oz (93.35 kg)  BMI 33.22 kg/m2  General:  alert, well-developed, well-nourished, and well-hydrated.   Skin:   1 cm, non fluctuant, non erythematous, non tender subcutaneous lump on back of left thigh. Psych:  Oriented X3, good eye contact, not depressed appearing, and moderately anxious.   nl cognition and speech.

## 2011-02-17 ENCOUNTER — Ambulatory Visit (INDEPENDENT_AMBULATORY_CARE_PROVIDER_SITE_OTHER): Payer: 59 | Admitting: Psychology

## 2011-02-17 DIAGNOSIS — F411 Generalized anxiety disorder: Secondary | ICD-10-CM

## 2011-02-19 ENCOUNTER — Telehealth: Payer: Self-pay | Admitting: *Deleted

## 2011-02-19 DIAGNOSIS — E669 Obesity, unspecified: Secondary | ICD-10-CM

## 2011-02-19 NOTE — Telephone Encounter (Signed)
Pt is trying to lose weight and she is asking how many calories she should try to stick to a day.  Her last weight was 205, at 5 ft 6.  Please advise.

## 2011-02-20 NOTE — Telephone Encounter (Signed)
Approximately 1800-2000 calories a day... But most important is type of food calories come from... Veggies, fruit, lean meat, high fiber carbs etc. Let me know if she is interested in nutritionist referral.

## 2011-02-25 NOTE — Telephone Encounter (Signed)
Patient would like a nutritionist referral

## 2011-02-25 NOTE — Telephone Encounter (Signed)
[name].. I enetered what I could for this nutrition referral but I was not sure what to put for the department to get her to [place] nutritionist. Also I was unsure as what to put for " Initial : hours in comments". Let me know if I need to reorder differently. Thanks.

## 2011-03-02 ENCOUNTER — Ambulatory Visit (INDEPENDENT_AMBULATORY_CARE_PROVIDER_SITE_OTHER): Payer: 59 | Admitting: Psychology

## 2011-03-02 DIAGNOSIS — F411 Generalized anxiety disorder: Secondary | ICD-10-CM

## 2011-03-10 ENCOUNTER — Ambulatory Visit (INDEPENDENT_AMBULATORY_CARE_PROVIDER_SITE_OTHER): Payer: 59 | Admitting: Family Medicine

## 2011-03-10 ENCOUNTER — Encounter: Payer: Self-pay | Admitting: Family Medicine

## 2011-03-10 DIAGNOSIS — R0789 Other chest pain: Secondary | ICD-10-CM

## 2011-03-10 DIAGNOSIS — L989 Disorder of the skin and subcutaneous tissue, unspecified: Secondary | ICD-10-CM | POA: Insufficient documentation

## 2011-03-10 DIAGNOSIS — M791 Myalgia, unspecified site: Secondary | ICD-10-CM

## 2011-03-10 DIAGNOSIS — K219 Gastro-esophageal reflux disease without esophagitis: Secondary | ICD-10-CM

## 2011-03-10 DIAGNOSIS — R5383 Other fatigue: Secondary | ICD-10-CM

## 2011-03-10 DIAGNOSIS — R5381 Other malaise: Secondary | ICD-10-CM

## 2011-03-10 DIAGNOSIS — IMO0001 Reserved for inherently not codable concepts without codable children: Secondary | ICD-10-CM

## 2011-03-10 LAB — CBC AND DIFFERENTIAL
HCT: 39 % (ref 36–46)
Hemoglobin: 12.9 g/dL (ref 12.0–16.0)
Neutrophils Absolute: 5 /uL
Platelets: 364 K/µL (ref 150–399)
WBC: 8.4 10*3/mL

## 2011-03-10 NOTE — Assessment & Plan Note (Signed)
Eval with labs including cbc, TSH, B12, vit D. Encouraged her to work on exercise and weight loss.

## 2011-03-10 NOTE — Assessment & Plan Note (Signed)
Most consistent with pain from GERD.Marland Kitchenerosions seen on recent EGD. Restart BID prilosec (per pt this has helped better than any other PPI in past)

## 2011-03-10 NOTE — Assessment & Plan Note (Signed)
Agree with Dr. Dayton Martes..most consistent with remaining inflammation/nodule following resolved abcess vs cyst. Continue to follow but pt told to expect decrease in size/resolution take a long time.

## 2011-03-10 NOTE — Patient Instructions (Addendum)
Bring in labs from work from wellness at work. We will call with lab results. Increase back to twice a day prilosec. Start regular exercise.

## 2011-03-10 NOTE — Progress Notes (Signed)
Subjective:    Patient ID: Lacey Decker, female    DOB: [birth year], 40 y.o.   MRN: [id number]  HPI 40 year old female with severe anxiety and some element of hypochondrianism presnts with the following issues.  1.knot on leg: noted 2 months ago. Saw Dr. Dayton Martes 3 weeks ago..felt resolving abccess with not associated infection. Has some tenderness in the area...mainly because she is  No drainage, no redness.    2. Chest pain chronic ...she feel chest aches constantly for several days at at time, several times a month.  . Not associated with panic attacks. In past thought may have improved with omeprazole 40 some.Marland Kitchenoccuring less often  I past also told possible costrochondritis.Marland Kitchenbut no pain to palpation. Has had nml EKG 11/2010  Was told on endo few months ago... Esophagitis erosive....told to take BID prilosec but she is only using daily.   3.Fatigue continues...feels may be associated to weight gain.  She has not had lab eval in a while.  Ipids, DM screen at work in 11/2010...nml  Review of Systems  Constitutional: Positive for fatigue. Negative for fever.  HENT: Negative for congestion.   Respiratory: Negative for shortness of breath.   Cardiovascular: Positive for chest pain. Negative for palpitations and leg swelling.  Gastrointestinal: Negative for diarrhea, constipation, blood in stool and abdominal distention.  Genitourinary: Negative for dysuria, urgency, frequency and hematuria.  Musculoskeletal: Positive for back pain.  Psychiatric/Behavioral: Negative for suicidal ideas, hallucinations, confusion and sleep disturbance. The patient is nervous/anxious.        Objective:   Physical Exam  Constitutional: Vital signs are normal. She appears well-developed and well-nourished. She is cooperative.  Non-toxic appearance. She does not appear ill. No distress.  HENT:  Head: Normocephalic.  Right Ear: Hearing, tympanic membrane, external ear and ear canal normal. Tympanic  membrane is not erythematous, not retracted and not bulging.  Left Ear: Hearing, tympanic membrane, external ear and ear canal normal. Tympanic membrane is not erythematous, not retracted and not bulging.  Nose: No mucosal edema or rhinorrhea. Right sinus exhibits no maxillary sinus tenderness and no frontal sinus tenderness. Left sinus exhibits no maxillary sinus tenderness and no frontal sinus tenderness.  Mouth/Throat: Uvula is midline, oropharynx is clear and moist and mucous membranes are normal.  Eyes: Conjunctivae, EOM and lids are normal. Pupils are equal, round, and reactive to light. No foreign bodies found.  Neck: Trachea normal and normal range of motion. Neck supple. Carotid bruit is not present. No mass and no thyromegaly present.  Cardiovascular: Normal rate, regular rhythm, S1 normal, S2 normal, normal heart sounds, intact distal pulses and normal pulses.  Exam reveals no gallop and no friction rub.   No murmur heard. Pulmonary/Chest: Effort normal and breath sounds normal. Not tachypneic. No respiratory distress. She has no decreased breath sounds. She has no wheezes. She has no rhonchi. She has no rales. She exhibits no tenderness, no bony tenderness, no laceration and no deformity.  Abdominal: Soft. Normal appearance and bowel sounds are normal. There is no tenderness.  Neurological: She is alert.  Skin: Skin is warm, dry and intact. No rash noted.       1 cm round mobile lesion on left posterior leg in fatty tissue. No fluctuance, no erythema, no discharge  Psychiatric: Her speech is normal and behavior is normal. Judgment and thought content normal. Her mood appears not anxious. Cognition and memory are normal. She does not exhibit a depressed mood.  Assessment & Plan:

## 2011-03-17 ENCOUNTER — Ambulatory Visit: Payer: Self-pay | Admitting: Family Medicine

## 2011-03-24 NOTE — Assessment & Plan Note (Signed)
NAME:  Lacey Decker, KITHCART NO.:  [id number]   MEDICAL RECORD NO.:  [id number]          PATIENT TYPE:  POB   LOCATION:  [facility] at [facility]         [facility]ACILITY:  [facility]   PHYSICIAN:  Ginger Carne, MD DATE OF BIRTH:  1971-08-28   DATE OF SERVICE:  08/08/2007                                  CLINIC NOTE   HISTORY OF PRESENT ILLNESS:  This patient is a 40 year old gravida 8,  para 3-0-5-3, Caucasian female who presents with a 1-year history of  intermenstrual cramping as well as dysmenorrhea which has worsened over  the past 4 to 5 months. The patient states she bleeds approximately  every 2.5 weeks, lasting anywhere from 3 to 5 days with exacerbation of  said bleeding. She denies dyspareunia. She has a history of degenerative  back disease. She denies bowel gastroenterology symptomatology including  constipation, weight loss, weight gain or diarrhea or other symptoms of  irritable bowel syndrome. She has no genitourinary sources for her pain.  She has 40 been evaluated for same by a gynecologist. Dr. [name]  had evaluated the patient most recently for said discomfort.   OB/GYN HISTORY:  The patient has had 3 normal vaginal deliveries and 5  first-trimester incomplete abortions. She has had a tubal ligation.   ALLERGIES:  INCLUDE CODEINE FROM WHICH SHE HAS HAD CARDIAC ARREST AND  MORPHINE HAVING SWELLING AND HIVES.   CURRENT MEDICATIONS:  1. Tramadol, 50 mg 1 by mouth every 6 hours as needed.  2. Ativan 2 mg twice a day.  3. Lamictal 25 mg 1 daily.  4. Nexium 20 mg 1 daily by mouth.   MEDICAL HISTORY:  Incudes  1. Chronic lower back pain with exacerbation.  2. GERD.  3. Bipolar disease.   PAST SURGICAL HISTORY:  1. She has had 3 neuromas excised from her right pubic bone.  2. Two dilation and curettages.  3. Bilateral tubal ligation.  4. She has had right hand surgery.  5. She has had fracture of her right elbow x3 and left elbow x1.  6. Questionable  LEEP surgery of her cervix about 10 years ago. The      patient is uncertain as to the nature of this last operation.   SOCIAL HISTORY:  The patient works at [employer] as a Teacher, music. She  is a nonsmoker and denies use of illicit drugs or excessive alcohol.   FAMILY HISTORY:  Her mother has hypertension but no first-degree  relatives with breast, colon, ovarian or uterine carcinoma.   REVIEW OF SYSTEMS:  A 14-point, comprehensive review of systems within  normal limits.   PHYSICAL EXAMINATION:  VITAL SIGNS: Blood pressure 115/78, weight 170  pounds, pulse 88 and regular.  HEENT: Grossly normal.  BREAST: Exam without mass or discharge, thickenings or tenderness.  CHEST: Clear to auscultation and percussion.  CARDIOVASCULAR: Exam without murmurs or enlargements, regular rate and  rhythm.  ABDOMEN: Soft without gross hepatosplenomegaly, slight tenderness.  NEUROLOGICAL: The patient demonstrates no specific neurological findings  compatible with lateralization of the lower or upper extremities.  Cranial nerves II through XII intact.  EXTREMITIES: Leg-raising sign negative bilaterally. Good and equal  sensation  of lower extremities.  LYMPHATICS, SKIN AND NEUROLOGICAL: Systems otherwise within normal  limits. No evidence of weakness of either lower or upper extremities.  PELVIC: Exam, GC and Chlamydia performed. Pap performed by primary care  physician. External genitalia, vulva and vagina normal. Cervix smooth  without erosions or lesions. Uterus was small, anteverted and flexed.  Both adnexa palpable, tender as well as uterine motion.  RECTOVAGINAL: Exam confirmatory.   IMPRESSION:  Chronic pelvic pain with component of lower back pain.   PLAN:  I had spoken to Dr. Ermalene Searing today and have ordered a CT with and  without contrast with delayed films to evaluate her bowel and  specifically the possibility of possible kinking secondary to  endometriosis. In addition, I have ordered an  MRI of the lumbosacral  region to evaluate any disc process, narrowing, and/or a retroperitoneal  sarcoma. Assuming that these tests turn out to be negative for  musculoskeletal or neurological processes, and/or the possibility of  intraintestinal pathology, the patient will be scheduled for an  operative laparoscopy. We discussed in detail the nature of  endometriosis and both the medical and surgical treatment approaches.  She has declined medical approach to endometriosis if in fact this is  her diagnosis. We discussed the possibility of proceeding at the same  setting with definitive surgery in the event that this is endometriosis.   PLAN:  The plan is to have the patient return for followup consultation  to discuss risks, benefits associated with said surgery and expectations  as well. I promised the patient I would call her after her test results  are available. CBC and complete metabolic profile were also ordered.  Mepergan Forte 1 every 6 hours, #30 was prescribed. I consulted Dr. Algie Coffer from our anesthesia department in view of her allergies to  morphine and codeine, the possibility of using Dilaudid, but we both  agreed that [facility] forte may be a safer route at this point as she has  tolerated Demerol in the past. The patient was satisfied at the end of  the visit and will await results of her tests.           ______________________________  Ginger Carne, MD     SHB/MEDQ  D:  08/08/2007  T:  08/08/2007  Job:  [id number]   cc:   [name], MD

## 2011-03-24 NOTE — Assessment & Plan Note (Signed)
NAME:  Lacey Decker, SHENEFIELD NO.:  [id number]   MEDICAL RECORD NO.:  [id number]          PATIENT TYPE:  POB   LOCATION:  [facility] at [facility]         FACILITY:  [facility]   PHYSICIAN:  Ginger Carne, MD DATE OF BIRTH:  14-Aug-1971   DATE OF SERVICE:                                  CLINIC NOTE   This patient is a 40 year old gravida 8, para 3-0-5-3 Caucasian female  who presents with a nearly 2-year history of a worsening intermenstrual  cramping as well as dysmenorrhea and dyspareunia.  She had been  initially seen in the office in September 2008, at which time she  presented with said symptomatology.  Her menses are approximately 3  weeks apart lasting approximately 5 days.  The patient originally had  not had dyspareunia but has had symptoms of same over the past 3-4  months.  She was evaluated with a CT scan of the pelvis and abdomen with  contrast, which was essentially normal.  In addition, an MRI of the  lumbar spine without contrast revealed minimal desiccation of disk  signal of L1-L2 without foraminal narrowing or herniation or stenoses.  She has no GU or GI symptomatology for her sources of pain and  specifically has no symptoms of interstitial cystitis.  She has used  ibuprofen and/or tramadol for pain on an as-needed basis.   OB/GYN HISTORY:  The patient has had 3 normal vaginal deliveries and 5  first trimester incomplete abortions, and she has had a bilateral tubal  ligation in the past.   ALLERGIES:  CODEINE, which she has had a cardiac arrest and MORPHINE,  which she has had swelling and hives.   CURRENT MEDICATIONS:  1. Ativan 2 mg 1 twice a day.  2. Lamictal 25 mg daily.  3. Depakote 500 mg one a day.   MEDICAL HISTORY:  Bipolar disease, GERD, and chronic lower back pain.   SURGICAL HISTORY:  Removal of 3 neuromas excised from the right pubic  bone, 2 dilation and curettages, bilateral tubal ligation, right hand  surgery, fracture of her right  elbow x 3, and left elbow x1, and LEEP  surgery of her cervix about 10 years ago.   SOCIAL HISTORY:  The patient works at  [employer].  She is a nonsmoker.  Denies illicit drug abuse or excessive alcohol use.   FAMILY HISTORY:  Mother has hypertension but no first-degree relatives  with breast, colon, ovarian, or uterine carcinoma.   REVIEW OF SYSTEMS:  A 14-point comprehensive review of systems within  normal limits.   PHYSICAL EXAMINATION:  VITAL SIGNS:  Blood pressure is 110/74, weight  166 pounds, height 5 feet 5-1/2 inches, pulse 63 and regular.  HEENT:  Grossly normal.  BREASTS:  Without masses, discharge, thickenings or tenderness.  CHEST:  Clear to percussion and auscultation.  CARDIOVASCULAR:  Without murmurs or enlargements, regular rate and  rhythm.  EXTREMITIES, LYMPHATIC, SKIN, NEUROLOGICAL, MUSCULOSKELETAL SYSTEMS:  Within normal limits.  ABDOMEN:  Soft without gross hepatosplenomegaly.  PELVIC :  Pap smear performed.  External genitalia, vulva, and vagina  normal.  Cervix smooth without erosions or lesions.  Uterus is tender,  both adnexa palpable and  tender.   IMPRESSION:  Chronic pelvic pain.   PLAN:  The patient at this time wishes to have definitive management  related to her chronic pelvic pain.  She has never had a diagnostic  laparoscopy, and therefore it is unclear whether the source of her  discomfort is related to endometriosis or chronic inflammatory disease  of the pelvis.  She is also concerned about time off from work if one of  these diseases was found and surgical management is necessary.  She does  not wish to have medical management if endometriosis is found including  Depo Lupron, continual oral contraceptives with and without  norethindrone acetate.   In discussion with the patient, it was agreed that if laparoscopy  reveals no significant pathological pelvic findings.  The procedure will  be ended and further consideration of managing her  pelvic pain will be  offered.  In the event of endometriosis, a chronic pelvic inflammatory  diseases found, a laparoscopic-assisted vaginal hysterectomy and  bilateral salpingo-oophorectomy will be performed.  The patient states  the pain in the pelvis is bilateral.  Possible appendectomy will also be  offered.  If the appendix is involved with any disease process.  The  nature of said procedure was discussed in detail including recovery,  risks including possible injuries to ureter bowel, and bladder, possible  conversion to a open procedure, hemorrhage possibly a requiring blood  transfusion, infection and other unforeseen complications were discussed  and understood by said patient.  Tramadol 50 mg 1 up to 3 times a day as  needed for pain was prescribed to the patient, #40 with 1 refill.           ______________________________  Ginger Carne, MD     SHB/MEDQ  D:  04/30/2008  T:  05/01/2008  Job:  [id number]   cc:   Excell Seltzer, MD  [facility]

## 2011-03-27 NOTE — Op Note (Signed)
[facility]  Patient:    Lacey, Decker                        MRN: [id number] Proc. Date: 12/16/99 Adm. Date:  [id number] Attending:  Sypher, Douglass Rivers CC:         Lacey Decker, P.A.                           Operative Report  PREOPERATIVE DIAGNOSIS:  Status post laceration of proximal phalangeal segment,  right long finger, with loss of sensibility ulnar aspect of finger and pulp with history of arterial bleeding.  POSTOPERATIVE DIAGNOSIS:  Sidewall laceration of ulnar proper digital artery, right long finger.  Laceration of ulnar proper digital nerve right long finger.  PROCEDURE:  Microsurgical repair of ulnar proper digital artery, right long finger. Microsurgical repair of ulnar peroper digital nerve, right long finger.  SURGEON:  Katy Fitch. Sypher, Montez Hageman., M.D.  ASSISTANT:  Stanford Scotland, C.S.T., C.F.A.  ANESTHESIA:  General anesthesia by LMA, supervising anesthesiologist, Cliffton Asters. Ivin Booty, M.D.  INDICATIONS:  Lacey Decker is a 40 year old woman who accidentally lacerated her right long finger on a broken wineglass one week prior.  She was seen at the [facility] emergency room where her wounds were evaluated by Lacey Decker, P.A. She was noted to have anesthesia in the ulnar pulp of her finger and therefore, her wound was irrigated and repaired.  She is repaired for an elective hand surgery follow-up consult.  She was seen in the office and noted to have anesthesia in the ulnar proper digital distribution of the long finger and reported signs of artery bleeding immediately after the injury.  Arrangements were made for exploration of her finger at this  time.  DESCRIPTION OF PROCEDURE:  Lacey Decker is brought to the operating room and placed in the supine position on the operating table.  Following induction of general anesthesia by LMA, the right arm was prepped with Betadine soap  and solution and sterilely draped.  Following exsanguination of the limb with an Esmarch bandage, the arterial tourniquet was inflated to 220 mmHg.  The procedure commenced with extension of the traumatic wound with a Bruners zigzag incision.  The subcutaneous tissues were carefully divided taking care to identify the flexor sheath and the ulnar neurovascular bundle.  There was noted to be a complete laceration of the ulnar proper digital nerve.  The proximal and distal segments  were easily identified and tagged.  The ulnar proper digital artery had a 50% side wall laceration that still had fresh clot and was bleeding.  We elected to repair the artery.  The injured segment was resected with microscissors followed by adventitial stripping, dilation, and irrigation with heparin and saline.  Utilizing backwall first technique and 10-0 nylon suture, he artery was repaired anatomically.  The nerve was then reapproximated with three 9-0 nylon sutures after fasicular matching using epineural technique.  The wound was then repaired with interrupted sutures of 5-0 nylon.  The finger was dressed with Xeroflow, fluff gauze, Curlex, sterile Webril, and  dorsal blocking splint maintaining the MP joints in 60 degrees of flexion.  Lacey Decker was awakened from anesthesia and transferred to the recovery room with stable vital signs.  She will be discharged with prescriptions for [facility] one or two tablets p.o. q.4-6h. p.r.n. pain, 20 tablets without refill, and also Keflex  500 mg one p.o. q.8h. x 4 days as prophylactic antibiotic.  She will return to our office in a week for suture removal and initiation of a therapy program. DD:  12/16/99 TD:  12/16/99 Job: [id number]

## 2011-03-27 NOTE — Op Note (Signed)
[facility]  Patient:    Lacey Decker, Lacey Decker                      MRN: [id number] Proc. Date: 01/25/01 Adm. Date:  [id number] Attending:  Glenna Fellows Tappan                           Operative Report  PREOPERATIVE DIAGNOSES:  Lipoma of the vulva.  POSTOPERATIVE DIAGNOSES:  Lipoma of the vulva.  OPERATION PERFORMED:  Excision lipoma of the vulva.  SURGEON:  Dr. Johna Sheriff.  ANESTHESIA:  Laryngeal mask general.  BRIEF HISTORY:  Lacey Decker is a 40 year old white female with a history of previous painful lipoma removal from the labia. She now presents with a 1.5 cm fairly discreet subcutaneous mass on the left superior labium majus. She complains of fairly significant pain and tenderness in this area. After discussion would like to proceed with excision under general anesthesia. The nature of the procedure and risks of bleeding and infection were discussed and understood preoperatively.  DESCRIPTION OF PROCEDURE:  The patient was brought to the operating room, placed in supine position on the operating table and laryngeal mask general anesthesia was induced. She was carefully positioned in lithotomy position with the perineum sterilely prepped and draped. The mass was discreet and easily palpable in the superior to medial labium majus. An incision was made directly over this and dissection carried down onto the mass. Using blunt dissection, a typical appearing lipoma bulged through the skin surface. Posteriorly the mass was not as discreet and the palpable abnormality was excised with cautery. Hemostasis was assured. The soft tissue was infiltrated with Marcaine. The skin was then closed with running subcuticular 4-0 monocryl and Dermabond. Sponge, needle and instrument counts were correct. The patient was taken to the recovery room in good condition. DD:  01/25/01 TD:  01/26/01 Job: [id number]

## 2011-04-03 ENCOUNTER — Encounter: Payer: Self-pay | Admitting: Family Medicine

## 2011-04-21 ENCOUNTER — Encounter: Payer: Self-pay | Admitting: Family Medicine

## 2011-04-29 ENCOUNTER — Encounter: Payer: Self-pay | Admitting: *Deleted

## 2011-05-18 ENCOUNTER — Other Ambulatory Visit: Payer: Self-pay | Admitting: Family Medicine

## 2011-06-12 ENCOUNTER — Telehealth: Payer: Self-pay | Admitting: *Deleted

## 2011-06-12 MED ORDER — PYRETHRINS-PIPERONYL BUTOXIDE 0.33-4 % EX SHAM: MEDICATED_SHAMPOO | Freq: Once | CUTANEOUS | Status: AC

## 2011-06-12 NOTE — Telephone Encounter (Signed)
Left message on machine for pt. I am not sure what she has already tried for this. I will send in rid prescription, but she is to call back on Monday if she has already unsuccessfully tried this med.

## 2011-06-12 NOTE — Telephone Encounter (Signed)
Pt has been fighting head lice for 5 weeks and she is extremely frustrated.  Her grandchild's doctor is going to give a prescription medicine but told her she needs to call her doctor for a script.  Uses [facility], she is asking for enough to treat her daughter as well, who is not a pt and doesn't have insurance.

## 2011-06-15 ENCOUNTER — Telehealth: Payer: Self-pay | Admitting: *Deleted

## 2011-06-15 NOTE — Telephone Encounter (Signed)
noted 

## 2011-06-15 NOTE — Telephone Encounter (Signed)
Triage Record Num: [id number] Operator: Caswell Corwin Patient Name: Lacey Decker Call Date & Time: 06/12/2011 6:19:57PM Patient Phone: [phone] PCP: [name] Patient Gender: Female PCP Fax : [phone] Patient DOB: [birth year] Practice Name: [facility] MRN: [id number] Reason for Call: Pt calling that she called the ofc x 3 today for Headlice and she is very upset. She has tried Rid, Administrator, arts. Still has live lice in hair. Triaged Lice Infestation and has tried Rid x2 and Nix x 2. Called Dr. Eustaquio Boyden and inst pt may have Permethrin 5 % cream 1 tube, no refill and use exactly as directed. If not better in 1 weeks recall ofc. Called into [facility] at [facility] @ [phone] and spoke with [place]. Home care and call back inst given. Protocol(s) Used: Lice Infestation or Exposure Recommended Outcome per Protocol: See Provider within 24 hours Reason for Outcome: Lice/nits seen in any head or body hair AND does not respond to two nonprescription treatments used 7 to 10 days apart Care Advice: ~ Call provider if symptoms worsen or more lice are seen. Do not share personal items like combs, brushes, headphones, coats and hats, or items like stuffed toys. Any personal contact, including sexual contact, or contact with infected clothing or bedding should be avoided. ~ ~ Combs and hair brushes should be treated with lice treatment used on hair and scalp or soak in hot water. ~ SYMPTOM / CONDITION MANAGEMENT Wash and dry all clothes including underclothes worn from 2 days before treatment, and any bed linens and towels in hot (130 F degrees/54 C degrees) washer and dryer. Furniture, carpets and stuffed animals can be vacuumed or small items may be sealed in a plastic bag for 14 days. ~ If two previous treatments with a nonprescription louse treatment have failed, the provider may prescribe lindane ([place]). Follow the instructions exactly as given by the provider or  pharmacist for use. DO NOT apply a second time. DO NOT use on anyone weighing less than 110 pounds, especially children. ~ Nits can be removed by combing through the wet hair (pubic hair too) with a fine-tooth comb (nit comb), following shampooing or treatment. Some lice can be lifted out of the hair this way too. Nits may be easier to remove if a vinegar or vinegar-based product is applied to the hair for 3 minutes before combing out the nits. These products should not be used following permethrin type treatment, as it may inactivate the long-acting properties of the treatment. ~ LICE TREATMENT- Non-pregnant: - Permethrin (Nix 1%); available nonprescription. Applied to hair and scalp for 10 minutes and then rinsed off. - Most providers advise a second treatment 7 to 10 days later to ensure a cure. - Pyrethrins (RID, R&C, Pronto, A-200); available nonprescription. Reapply 7 days after first treatment to kill hatching nits. - Follow instructions on package or as directed by pharmacist or your provider. Do not use hair conditioners prior to lice treatment because they may coat the hair and protect the lice and nits. ~ 06/12/2011 6:48:30PM Page 1 of 1 [id number]

## 2011-06-23 ENCOUNTER — Encounter: Payer: Self-pay | Admitting: Family Medicine

## 2011-06-23 ENCOUNTER — Ambulatory Visit (INDEPENDENT_AMBULATORY_CARE_PROVIDER_SITE_OTHER): Payer: 59 | Admitting: Family Medicine

## 2011-06-23 VITALS — BP 120/70 | HR 75 | Temp 98.9°F | Ht 65.0 in | Wt 197.4 lb

## 2011-06-23 DIAGNOSIS — R197 Diarrhea, unspecified: Secondary | ICD-10-CM

## 2011-06-23 MED ORDER — LORAZEPAM 1 MG PO TABS: 1.0000 mg | ORAL_TABLET | ORAL | Status: DC | PRN

## 2011-06-23 NOTE — Progress Notes (Signed)
Subjective:    Patient ID: Lacey Decker, female    DOB: Nov 20, 1970, 40 y.o.   MRN: [id number]  HPI 40 year old female with severe anxiety and self admitted hypochondrianism presnts with the following issues:  Abdominal pain, nausea, vomiting and headache.  Stared over a week ago.  Co worker had similar symptoms and she is already better. [place] is no longer vomiting but still having frequent loose stools. No blood or mucous in stool. Have some abdominal cramping but no pain.  H/o Esophagitis erosive.  Supposed to take Prilosec twice daily but ran out of it. Has not been taking any immodium.  Patient Active Problem List  Diagnoses  . MRSA  . DSORD BIPOLAR I, UNSPC, MOST RECENT EPSD  . ANXIETY  . PANIC ATTACKS  . MIGRAINE, COMMON, INTRACTABLE  . TRIGEMINAL NEURALGIA  . VERTEBROBASILAR INSUFFICIENCY  . Esophageal Reflux  . DYSFUNCTIONAL UTERINE BLEEDING  . ROSACEA  . OBESITY, UNSPECIFIED  . ALLERGIC RHINITIS  . LEG PAIN, RIGHT  . CHEST PAIN, ATYPICAL  . Fatigue  . Skin lesion of left lower limb   Past Medical History  Diagnosis Date  . Chlamydia     as a teen  . Anxiety   . Bipolar 1 disorder   . MRSA (methicillin resistant staph aureus) culture positive   . Alcohol abuse     last drink one year ago   Past Surgical History  Procedure Date  . Btl 11/09/1998  . Leep 2000  . Dilation and curettage of uterus 1997    after miscarriage  . Misscarriage     x5  . Nsvd     3  . Neuroma surgery     lipoma removal Right labia   History  Substance Use Topics  . Smoking status: Never Smoker   . Smokeless tobacco: Not on file  . Alcohol Use: No   Family History  Problem Relation Age of Onset  . Hypertension Mother   . Hyperlipidemia Mother   . Depression Mother   . Bipolar disorder Sister   . Depression Maternal Grandmother    Allergies  Allergen Reactions  . Clarithromycin     REACTION: Trouble breathing - stomach upset - bad taste in mouth  . Codeine       REACTION: anti phylactic shock  . Codeine Phosphate     REACTION: unspecified  . Morphine     REACTION: swelling  . Morphine Sulfate     REACTION: unspecified  . Sulfamethoxazole W/Trimethoprim     REACTION: N \\T \ V, rash   Current Outpatient Prescriptions on File Prior to Visit  Medication Sig Dispense Refill  . fluconazole (DIFLUCAN) 150 MG tablet Take 150 mg by mouth once.        Marland Kitchen omeprazole (PRILOSEC) 40 MG capsule One tablet daily      . valACYclovir (VALTREX) 1000 MG tablet TAKE 2 TABLETS BY MOUTH TWICE DAILY  4 tablet  0  . fluticasone (FLONASE) 50 MCG/ACT nasal spray 2 sprays by Nasal route daily.        . Sulfacetamide Sodium-Sulfur 10-5 % CREA Use as directed twice daily for Rosacea        The PMH, PSH, Social History, Family History, Medications, and allergies have been reviewed in [facility], and have been updated if relevant.  Review of Systems  See HPI     Objective:   Physical Exam  BP 120/70  Pulse 75  Temp(Src) 98.9 F (37.2 C) (Oral)  Ht 5'  5" (1.651 m)  Wt 197 lb 6.4 oz (89.54 kg)  BMI 32.85 kg/m2  SpO2 90%  Constitutional: Vital signs are normal. She appears well-developed and well-nourished. She is cooperative.  Non-toxic appearance. She does not appear ill. No distress.  HENT:  Head: Normocephalic.  Right Ear: Hearing, tympanic membrane, external ear and ear canal normal. Tympanic membrane is not erythematous, not retracted and not bulging.  Left Ear: Hearing, tympanic membrane, external ear and ear canal normal. Tympanic membrane is not erythematous, not retracted and not bulging.  Nose: No mucosal edema or rhinorrhea. Right sinus exhibits no maxillary sinus tenderness and no frontal sinus tenderness. Left sinus exhibits no maxillary sinus tenderness and no frontal sinus tenderness.  Mouth/Throat: Uvula is midline, oropharynx is clear and moist and mucous membranes are normal.  Eyes: Conjunctivae, EOM and lids are normal. Pupils are equal, round, and  reactive to light. No foreign bodies found.  Neck: Trachea normal and normal range of motion. Neck supple. Carotid bruit is not present. No mass and no thyromegaly present.  Cardiovascular: Normal rate, regular rhythm, S1 normal, S2 normal, normal heart sounds, intact distal pulses and normal pulses.  Exam reveals no gallop and no friction rub.   No murmur heard. Pulmonary/Chest: Effort normal and breath sounds normal. Not tachypneic. No respiratory distress. She has no decreased breath sounds. She has no wheezes. She has no rhonchi. She has no rales. She exhibits no tenderness, no bony tenderness, no laceration and no deformity.  Abdominal: Soft. Normal appearance and bowel sounds are normal. There is no tenderness.  Neurological: She is alert.  Psychiatric: very anxious, tearful      Assessment & Plan:   1. Diarrhea    New.  Symptoms resolving and seem most consistent with gastroenteritis. Mucous membranes moist, appears well hydrated. I refilled her lorazepam as her anxiety is worsening her symptoms. See pt instructions for details.

## 2011-06-23 NOTE — Patient Instructions (Signed)
Good to see you. Ok to take Immodium. Please call us if there is no improvement by the end of the week. Hang in there.

## 2011-07-30 LAB — I-STAT 8, (EC8 V) (CONVERTED LAB)
Acid-Base Excess: 1
Chloride: 108
HCT: 40
Hemoglobin: 13.6
Potassium: 4.5
Sodium: 140
pH, Ven: 7.323 — ABNORMAL HIGH

## 2011-07-30 LAB — DIFFERENTIAL
Basophils Relative: 0
Lymphs Abs: 1.6
Monocytes Absolute: 0.6
Monocytes Relative: 9
Neutro Abs: 4.3

## 2011-07-30 LAB — CBC
Hemoglobin: 13.1
MCHC: 34
MCV: 99.4
RBC: 3.89

## 2011-07-30 LAB — POCT CARDIAC MARKERS: Operator id: 285491

## 2011-07-30 LAB — POCT I-STAT CREATININE: Creatinine, Ser: 0.7

## 2011-08-05 ENCOUNTER — Ambulatory Visit (INDEPENDENT_AMBULATORY_CARE_PROVIDER_SITE_OTHER): Payer: 59 | Admitting: Psychology

## 2011-08-05 DIAGNOSIS — F411 Generalized anxiety disorder: Secondary | ICD-10-CM

## 2011-08-06 ENCOUNTER — Ambulatory Visit (INDEPENDENT_AMBULATORY_CARE_PROVIDER_SITE_OTHER): Payer: 59 | Admitting: Family Medicine

## 2011-08-06 ENCOUNTER — Encounter: Payer: Self-pay | Admitting: Family Medicine

## 2011-08-06 VITALS — BP 120/84 | HR 70 | Temp 98.5°F | Wt 203.5 lb

## 2011-08-06 DIAGNOSIS — N390 Urinary tract infection, site not specified: Secondary | ICD-10-CM | POA: Insufficient documentation

## 2011-08-06 LAB — POCT URINALYSIS DIPSTICK
Bilirubin, UA: NEGATIVE
Glucose, UA: NEGATIVE
Ketones, UA: NEGATIVE
Spec Grav, UA: 1.015
pH, UA: 5

## 2011-08-06 NOTE — Patient Instructions (Signed)
Back Pain (Lumbosacral Strain)   Back pain is one of the most common causes of pain. There are many causes of back pain. Most are not serious conditions.   CAUSES   Your backbone (spinal column) is made up of 24 main vertebral bodies, the sacrum, and the coccyx. These are held together by muscles and tough, fibrous tissue (ligaments). Nerve roots pass through the openings between the vertebrae. A sudden move or injury to the back may cause injury to, or pressure on, these nerves. This may result in localized back pain or pain movement (radiation) into the buttocks, down the leg, and into the foot. Sharp, shooting pain from the buttock down the back of the leg (sciatica) is frequently associated with a ruptured (herniated) disc. Pain may be caused by muscle spasm alone.   Your caregiver can often find the cause of your pain by the details of your symptoms and an exam. In some cases, you may need tests (such as X-rays). Your caregiver will work with you to decide if any tests are needed based on your specific exam.   HOME CARE INSTRUCTIONS   Avoid an underactive lifestyle. Active exercise, as directed by your caregiver, is your greatest weapon against back pain.   Avoid hard physical activities (tennis, racquetball, water-skiing) if you are not in proper physical condition for it. This may aggravate and/or create problems.   If you have a back problem, avoid sports requiring sudden body movements. Swimming and walking are generally safer activities.   Maintain good posture.   Avoid becoming overweight (obese).   Use bed rest for only the most extreme, sudden (acute) episode. Your caregiver will help you determine how much bed rest is necessary.   For acute conditions, you may put ice on the injured area.   Put ice in a plastic bag.   Place a towel between your skin and the bag.   Leave the ice on for 15 minutes at a time, every 2 hours, or as needed.   After you are improved and more active, it may help to apply heat  for 30 minutes before activities.   See your caregiver if you are having pain that lasts longer than expected. Your caregiver can advise appropriate exercises and/or therapy if needed. With conditioning, most back problems can be avoided.   SEEK IMMEDIATE MEDICAL CARE IF:   You have numbness, tingling, weakness, or problems with the use of your arms or legs.   You experience severe back pain not relieved with medicines.   There is a change in bowel or bladder control.   You have increasing pain in any area of the body, including your belly (abdomen).   You notice shortness of breath, dizziness, or feel faint.   You feel sick to your stomach (nauseous), are throwing up (vomiting), or become sweaty.   You notice discoloration of your toes or legs, or your feet get very cold.   Your back pain is getting worse.   You have an oral temperature above 100.5, not controlled by medicine.   MAKE SURE YOU:   Understand these instructions.   Will watch your condition.   Will get help right away if you are not doing well or get worse.   Document Released: 08/05/2005 Document Re-Released: 01/20/2010   Exit[employer]® Patient Information ©2011 [employer].

## 2011-08-06 NOTE — Progress Notes (Signed)
SUBJECTIVE: Lacey Decker is a 40 y.o. female with admitted hypochondriasis who complains of left CVA pain x 3days, without dysuria, fever, chills, or abnormal vaginal discharge or bleeding.   Patient Active Problem List  Diagnoses  . MRSA  . DSORD BIPOLAR I, UNSPC, MOST RECENT EPSD  . ANXIETY  . PANIC ATTACKS  . MIGRAINE, COMMON, INTRACTABLE  . TRIGEMINAL NEURALGIA  . VERTEBROBASILAR INSUFFICIENCY  . Esophageal Reflux  . DYSFUNCTIONAL UTERINE BLEEDING  . ROSACEA  . OBESITY, UNSPECIFIED  . ALLERGIC RHINITIS  . LEG PAIN, RIGHT  . CHEST PAIN, ATYPICAL  . Fatigue  . Skin lesion of left lower limb  . Diarrhea  . UTI (lower urinary tract infection)   Past Medical History  Diagnosis Date  . Chlamydia     as a teen  . Anxiety   . Bipolar 1 disorder   . MRSA (methicillin resistant staph aureus) culture positive   . Alcohol abuse     last drink one year ago   Past Surgical History  Procedure Date  . Btl 11/09/1998  . Leep 2000  . Dilation and curettage of uterus 1997    after miscarriage  . Misscarriage     x5  . Nsvd     3  . Neuroma surgery     lipoma removal Right labia   History  Substance Use Topics  . Smoking status: Never Smoker   . Smokeless tobacco: Not on file  . Alcohol Use: No   Family History  Problem Relation Age of Onset  . Hypertension Mother   . Hyperlipidemia Mother   . Depression Mother   . Bipolar disorder Sister   . Depression Maternal Grandmother    Allergies  Allergen Reactions  . Clarithromycin     REACTION: Trouble breathing - stomach upset - bad taste in mouth  . Codeine     REACTION: anti phylactic shock  . Codeine Phosphate     REACTION: unspecified  . Morphine     REACTION: swelling  . Morphine Sulfate     REACTION: unspecified  . Sulfamethoxazole W/Trimethoprim     REACTION: N \\T \ V, rash   Current Outpatient Prescriptions on File Prior to Visit  Medication Sig Dispense Refill  . fluconazole (DIFLUCAN) 150 MG tablet  Take 150 mg by mouth once.        . fluticasone (FLONASE) 50 MCG/ACT nasal spray 2 sprays by Nasal route daily.        Marland Kitchen LORazepam (ATIVAN) 1 MG tablet Take 1 tablet (1 mg total) by mouth as needed.  30 tablet  0  . omeprazole (PRILOSEC) 40 MG capsule One tablet daily      . Sulfacetamide Sodium-Sulfur 10-5 % CREA Use as directed twice daily for Rosacea       . valACYclovir (VALTREX) 1000 MG tablet TAKE 2 TABLETS BY MOUTH TWICE DAILY  4 tablet  0   The PMH, PSH, Social History, Family History, Medications, and allergies have been reviewed in [facility], and have been updated if relevant.  OBJECTIVE:  BP 120/84  Pulse 70  Temp(Src) 98.5 F (36.9 C) (Oral)  Wt 203 lb 8 oz (92.307 kg)  Appears well, in no apparent distress.  Vital signs are normal. The abdomen is soft without tenderness, guarding, mass, rebound or organomegaly. No CVA tenderness or inguinal adenopathy noted. SLR neg bilaterally, non tender to palp over spine. Urine dipstick shows positive for RBC's.    ASSESSMENT: Lumbar strain.  PLAN: Advised stretches, Tylenol  and ICE as needed. See pt instructions for details.

## 2011-08-07 ENCOUNTER — Ambulatory Visit: Payer: Self-pay | Admitting: Otolaryngology

## 2011-08-12 ENCOUNTER — Ambulatory Visit (INDEPENDENT_AMBULATORY_CARE_PROVIDER_SITE_OTHER): Payer: 59 | Admitting: Psychology

## 2011-08-12 ENCOUNTER — Encounter: Payer: Self-pay | Admitting: Family Medicine

## 2011-08-12 ENCOUNTER — Ambulatory Visit (INDEPENDENT_AMBULATORY_CARE_PROVIDER_SITE_OTHER): Payer: 59 | Admitting: Family Medicine

## 2011-08-12 VITALS — BP 130/80 | HR 76 | Temp 98.7°F | Ht 65.0 in | Wt 200.2 lb

## 2011-08-12 DIAGNOSIS — F331 Major depressive disorder, recurrent, moderate: Secondary | ICD-10-CM

## 2011-08-12 DIAGNOSIS — H6690 Otitis media, unspecified, unspecified ear: Secondary | ICD-10-CM

## 2011-08-12 DIAGNOSIS — F411 Generalized anxiety disorder: Secondary | ICD-10-CM

## 2011-08-12 MED ORDER — AMOXICILLIN 500 MG PO TABS: 500.0000 mg | ORAL_TABLET | Freq: Two times a day (BID) | ORAL | Status: AC

## 2011-08-12 NOTE — Progress Notes (Signed)
SUBJECTIVE:  Lacey Decker is a 40 y.o. female who complains of congestion, swollen glands and ear pain for 5 days. She denies a history of anorexia, chest pain and dizziness and denies a history of asthma. Patient denies smoke cigarettes.   Patient Active Problem List  Diagnoses  . MRSA  . DSORD BIPOLAR I, UNSPC, MOST RECENT EPSD  . ANXIETY  . PANIC ATTACKS  . MIGRAINE, COMMON, INTRACTABLE  . TRIGEMINAL NEURALGIA  . VERTEBROBASILAR INSUFFICIENCY  . Esophageal Reflux  . DYSFUNCTIONAL UTERINE BLEEDING  . ROSACEA  . OBESITY, UNSPECIFIED  . ALLERGIC RHINITIS  . LEG PAIN, RIGHT  . CHEST PAIN, ATYPICAL  . Fatigue  . Skin lesion of left lower limb  . Diarrhea  . UTI (lower urinary tract infection)   Past Medical History  Diagnosis Date  . Chlamydia     as a teen  . Anxiety   . Bipolar 1 disorder   . MRSA (methicillin resistant staph aureus) culture positive   . Alcohol abuse     last drink one year ago   Past Surgical History  Procedure Date  . Btl 11/09/1998  . Leep 2000  . Dilation and curettage of uterus 1997    after miscarriage  . Misscarriage     x5  . Nsvd     3  . Neuroma surgery     lipoma removal Right labia   History  Substance Use Topics  . Smoking status: Never Smoker   . Smokeless tobacco: Not on file  . Alcohol Use: No   Family History  Problem Relation Age of Onset  . Hypertension Mother   . Hyperlipidemia Mother   . Depression Mother   . Bipolar disorder Sister   . Depression Maternal Grandmother    Allergies  Allergen Reactions  . Clarithromycin     REACTION: Trouble breathing - stomach upset - bad taste in mouth  . Codeine     REACTION: anti phylactic shock  . Codeine Phosphate     REACTION: unspecified  . Morphine     REACTION: swelling  . Morphine Sulfate     REACTION: unspecified  . Sulfamethoxazole W/Trimethoprim     REACTION: N \\T \ V, rash   Current Outpatient Prescriptions on File Prior to Visit  Medication Sig Dispense  Refill  . fluticasone (FLONASE) 50 MCG/ACT nasal spray 2 sprays by Nasal route daily.        Marland Kitchen LORazepam (ATIVAN) 1 MG tablet Take 1 tablet (1 mg total) by mouth as needed.  30 tablet  0  . pantoprazole (PROTONIX) 40 MG tablet Take 40 mg by mouth daily.        . valACYclovir (VALTREX) 1000 MG tablet TAKE 2 TABLETS BY MOUTH TWICE DAILY  4 tablet  0   The PMH, PSH, Social History, Family History, Medications, and allergies have been reviewed in [facility], and have been updated if relevant.  OBJECTIVE: BP 130/80  Pulse 76  Temp(Src) 98.7 F (37.1 C) (Oral)  Ht 5\' 5"  (1.651 m)  Wt 200 lb 4 oz (90.833 kg)  BMI 33.32 kg/m2  She appears well, vital signs are as noted. Thick fluid behind right TM, left TM erythematous. Throat and pharynx normal.  Neck supple. No adenopathy in the neck. Nose is congested. Sinuses non tender. The chest is clear, without wheezes or rales.  ASSESSMENT:  otitis media  PLAN: Amoxicillin x 10 days.  Symptomatic therapy suggested: push fluids, rest and return office visit prn if symptoms  persist or worsen.Call or return to clinic prn if these symptoms worsen or fail to improve as anticipated.

## 2011-08-12 NOTE — Patient Instructions (Signed)
Take antibiotic as directed.  Drink lots of fluids.  Treat sympotmatically with Mucinex, nasal saline irrigation, and Tylenol/Ibuprofen. Also try claritin D or zyrtec D over the counter- two times a day as needed ( have to sign for them at pharmacy). You can use warm compresses.  Cough suppressant at night. Call if not improving as expected in 5-7 days.    

## 2011-08-14 LAB — I-STAT 8, (EC8 V) (CONVERTED LAB)
Acid-base deficit: 2
BUN: 15
Chloride: 106
HCT: 42
Hemoglobin: 14.3
Operator id: 285491
Potassium: 3.9
Sodium: 138
pCO2, Ven: 34.8 — ABNORMAL LOW

## 2011-08-14 LAB — URINALYSIS, ROUTINE W REFLEX MICROSCOPIC
Specific Gravity, Urine: 1.042 — ABNORMAL HIGH
pH: 6

## 2011-08-14 LAB — POCT I-STAT CREATININE: Operator id: 285491

## 2011-08-14 LAB — URINE MICROSCOPIC-ADD ON

## 2011-08-17 ENCOUNTER — Ambulatory Visit (INDEPENDENT_AMBULATORY_CARE_PROVIDER_SITE_OTHER): Payer: 59 | Admitting: Psychology

## 2011-08-17 DIAGNOSIS — F331 Major depressive disorder, recurrent, moderate: Secondary | ICD-10-CM

## 2011-08-17 DIAGNOSIS — F411 Generalized anxiety disorder: Secondary | ICD-10-CM

## 2011-08-26 ENCOUNTER — Ambulatory Visit (INDEPENDENT_AMBULATORY_CARE_PROVIDER_SITE_OTHER): Payer: 59 | Admitting: Psychology

## 2011-08-26 DIAGNOSIS — F331 Major depressive disorder, recurrent, moderate: Secondary | ICD-10-CM

## 2011-08-26 DIAGNOSIS — F411 Generalized anxiety disorder: Secondary | ICD-10-CM

## 2011-09-09 ENCOUNTER — Ambulatory Visit: Payer: 59 | Admitting: Psychology

## 2011-09-15 ENCOUNTER — Ambulatory Visit (INDEPENDENT_AMBULATORY_CARE_PROVIDER_SITE_OTHER): Payer: 59 | Admitting: Psychology

## 2011-09-15 DIAGNOSIS — F331 Major depressive disorder, recurrent, moderate: Secondary | ICD-10-CM

## 2011-09-15 DIAGNOSIS — F411 Generalized anxiety disorder: Secondary | ICD-10-CM

## 2011-09-18 ENCOUNTER — Ambulatory Visit (INDEPENDENT_AMBULATORY_CARE_PROVIDER_SITE_OTHER): Payer: 59 | Admitting: Family Medicine

## 2011-09-18 ENCOUNTER — Encounter: Payer: Self-pay | Admitting: Family Medicine

## 2011-09-18 ENCOUNTER — Other Ambulatory Visit: Payer: Self-pay | Admitting: Family Medicine

## 2011-09-18 VITALS — BP 120/78 | HR 84 | Temp 98.5°F | Wt 204.8 lb

## 2011-09-18 DIAGNOSIS — N949 Unspecified condition associated with female genital organs and menstrual cycle: Secondary | ICD-10-CM

## 2011-09-18 DIAGNOSIS — J3489 Other specified disorders of nose and nasal sinuses: Secondary | ICD-10-CM

## 2011-09-18 DIAGNOSIS — N9489 Other specified conditions associated with female genital organs and menstrual cycle: Secondary | ICD-10-CM

## 2011-09-18 DIAGNOSIS — M542 Cervicalgia: Secondary | ICD-10-CM

## 2011-09-18 DIAGNOSIS — Z202 Contact with and (suspected) exposure to infections with a predominantly sexual mode of transmission: Secondary | ICD-10-CM

## 2011-09-18 DIAGNOSIS — R0981 Nasal congestion: Secondary | ICD-10-CM

## 2011-09-18 MED ORDER — FLUCONAZOLE 150 MG PO TABS: 150.0000 mg | ORAL_TABLET | Freq: Once | ORAL | Status: AC

## 2011-09-18 MED ORDER — BACLOFEN 10 MG PO TABS: 10.0000 mg | ORAL_TABLET | Freq: Two times a day (BID) | ORAL | Status: DC | PRN

## 2011-09-18 NOTE — Progress Notes (Signed)
  Subjective:    Patient ID: Lacey Decker, female    DOB: 06-Nov-1971, 40 y.o.   MRN: [id number]  HPI CC: mult issues  For last month having chest congestion, feels phlegm in back of throat, coughing (dry cough).  Mild nausea.  Tried claritin and zyrtec and flonase.  L ear pain - with headache for last several days.  Describes sharp pain left temporal region.  No vision changes with this.  Left sinuses are stopped up.  Denies discharge from hear, hearing changes, ringing in ears.  Also with neck pain.  Types 10 hours/day.  Has not had ergonomic eval.  rec set up for this.  Possible bacterial infection, requests STD testing.  Burning, itching, swelling externally, no discharge or odor.  Tried miconazole which made things worse.  However swelling actually improved on this.  1 wk ago 1 new sexual partner unprotected sex, s/p recent divorce.  Worried about STD exposure with recent sxs.  Sometimes has trouble telling if infection or illness going on 2/2 nerves/anxiety.  H/o GERD but taking omeprazole daily (but not really controlling sxs).  No fevers/chills, abd pain, v/d.  Review of Systems Per HPI    Objective:   Physical Exam  Vitals reviewed. Constitutional: She appears well-developed and well-nourished. No distress.  HENT:  Head: Normocephalic and atraumatic.  Right Ear: Hearing, tympanic membrane, external ear and ear canal normal.  Left Ear: Hearing, tympanic membrane, external ear and ear canal normal.  Nose: No mucosal edema or rhinorrhea. Right sinus exhibits maxillary sinus tenderness. Right sinus exhibits no frontal sinus tenderness. Left sinus exhibits no maxillary sinus tenderness and no frontal sinus tenderness.  Mouth/Throat: Uvula is midline, oropharynx is clear and moist and mucous membranes are normal. No oropharyngeal exudate, posterior oropharyngeal edema, posterior oropharyngeal erythema or tonsillar abscesses.       Congested TMs bilaterally  Eyes: Conjunctivae  and EOM are normal. Pupils are equal, round, and reactive to light. No scleral icterus.  Neck: Normal range of motion. Neck supple.  Cardiovascular: Normal rate, regular rhythm, normal heart sounds and intact distal pulses.   No murmur heard. Pulmonary/Chest: Effort normal and breath sounds normal. No respiratory distress. She has no wheezes. She has no rales.  Abdominal: Soft. Bowel sounds are normal. She exhibits no distension and no mass. There is no tenderness. There is no rebound and no guarding.  Genitourinary: Pelvic exam was performed with patient supine. There is rash on the right labia. There is no lesion on the right labia. There is rash on the left labia. There is no lesion on the left labia. Vaginal discharge (white discharge) found.       Erythematous pruritic rash labia majora S/p hysterectomy so CT/GC collected from vaginal vault  Musculoskeletal: Normal range of motion.       No midline neck pain, FROM cervical neck. + tight splenius mm and tender there Neg spurling  Lymphadenopathy:    She has no cervical adenopathy.  Skin: Skin is warm and dry. No rash noted.  Psychiatric: She has a normal mood and affect.        Assessment & Plan:

## 2011-09-18 NOTE — Patient Instructions (Signed)
May return in 6 weeks for retesting. STD screen today. For neck - may try baclofen.  Update [place] if not improving. For cough/congestion - start daily flonase.  Restart zyrtec.  May use simple mucinex with plenty of fluid.  Update [place] if not improving, or fever> 101.5 or worsening cough instead of improving. For vaginal burning -likely yeast infection, I sent in diflucan.  May repeat in 4 days.  If not improved after this, let me know.

## 2011-09-19 DIAGNOSIS — R0981 Nasal congestion: Secondary | ICD-10-CM | POA: Insufficient documentation

## 2011-09-19 LAB — POCT WET PREP (WET MOUNT)
Clue Cells Wet Prep HPF POC: NEGATIVE
KOH Wet Prep POC: NEGATIVE
Trichomonas Wet Prep HPF POC: NEGATIVE

## 2011-09-19 NOTE — Assessment & Plan Note (Addendum)
STD screen today - CT/GC vaginal sample, HIV/RPR. Discussed HIV test and that pt may return in 6 wks for retesting (currently possible false negative window)

## 2011-09-19 NOTE — Assessment & Plan Note (Signed)
External rash on exam + white discharge. Wet prep negative. Will treat as yeast infection (possibly already partially treated after monistat x1) with diflucan x1, may rpt in 4 d.   If not improving, to notify us, consider metrogel/flagyl. Sent CT/GC as well as HIV/RPR.

## 2011-09-19 NOTE — Assessment & Plan Note (Signed)
?  worsened allergic rhinitis as not taking control meds regularly Did not find active infection on exam today.  rec treat with scheduled flonase, nasal saline, zyrtec, mucinex. Update [place] if not improved over weekend with this regimen, would likely treat with abx course.

## 2011-09-19 NOTE — Assessment & Plan Note (Signed)
Anticipate muscle strain vs TTH.  Will treat with muscle relaxant.  Pt requests baclofen as has worked well for her in past.  rec ice/heat.  Update if not improving.  No red flags today.

## 2011-09-21 LAB — HEPATITIS PANEL, ACUTE
Hep B C IgM: NEGATIVE
Hepatitis B Surface Ag: NEGATIVE

## 2011-09-21 NOTE — Progress Notes (Signed)
Addended by: Baldomero Lamy on: 09/21/2011 08:16 AM   Modules accepted: Orders

## 2011-09-22 LAB — GC/CHLAMYDIA PROBE AMP, GENITAL
Chlamydia, DNA Probe: NEGATIVE
GC Probe Amp, Genital: NEGATIVE

## 2011-09-23 ENCOUNTER — Ambulatory Visit: Payer: 59 | Admitting: Psychology

## 2011-09-29 ENCOUNTER — Encounter: Payer: Self-pay | Admitting: Family Medicine

## 2011-09-29 ENCOUNTER — Ambulatory Visit (INDEPENDENT_AMBULATORY_CARE_PROVIDER_SITE_OTHER): Payer: 59 | Admitting: Family Medicine

## 2011-09-29 VITALS — BP 120/86 | HR 71 | Temp 98.4°F | Ht 65.0 in | Wt 199.0 lb

## 2011-09-29 DIAGNOSIS — K219 Gastro-esophageal reflux disease without esophagitis: Secondary | ICD-10-CM

## 2011-09-29 DIAGNOSIS — F411 Generalized anxiety disorder: Secondary | ICD-10-CM

## 2011-09-29 MED ORDER — LORAZEPAM 1 MG PO TABS: 1.0000 mg | ORAL_TABLET | ORAL | Status: DC | PRN

## 2011-09-29 NOTE — Patient Instructions (Signed)
Good to see you, Lacey Decker. Hang in there. Please keep your appointment with Dr. Laymond Purser. Take your Ativan as needed--don't be afraid to use it as directed.

## 2011-09-29 NOTE — Progress Notes (Signed)
  Subjective:    Patient ID: Lacey Decker, female    DOB: July 05, 1971, 40 y.o.   MRN: [id number]  HPI 40 yo here with multiple complaints.  Has admitted anxiety concerning illnesses/hypochondrasis.  Saw Dr. Reece Agar last week for several complaints- treated for vaginal yeast infection with diflucan.    Sometimes has trouble telling if infection or illness going on 2/2 nerves/anxiety.  H/o GERD but taking omeprazole daily.  It does help with her symptoms if she is not too anxious. Two nights ago, had some epigastric pain and developed a severe panic attacks.  She was afraid to take her Ativan because only had a few pills left.  No fevers/chills, abd pain, v/d.  Going through a divorce, feels all of these symptoms are related to the divorce. Seeing Dr. Laymond Purser, has another appt with her next week.  Working on breathing techniques.  Review of Systems Per HPI    Objective:   Physical Exam  BP 120/86  Pulse 71  Temp(Src) 98.4 F (36.9 C) (Oral)  Ht 5\' 5"  (1.651 m)  Wt 199 lb (90.266 kg)  BMI 33.12 kg/m2  Constitutional: She appears well-developed and well-nourished. No distress.  HENT:  Head: Normocephalic and atraumatic.  Right Ear: Hearing, tympanic membrane, external ear and ear canal normal.  Left Ear: Hearing, tympanic membrane, external ear and ear canal normal.  Eyes: Conjunctivae and EOM are normal. Pupils are equal, round, and reactive to light. No scleral icterus.  Neck: Normal range of motion. Neck supple.  Cardiovascular: Normal rate, regular rhythm, normal heart sounds and intact distal pulses.   No murmur heard. Pulmonary/Chest: Effort normal and breath sounds normal. No respiratory distress. She has no wheezes. She has no rales.  Abdominal: Soft. Bowel sounds are normal. She exhibits no distension and no mass. There is no tenderness. There is no rebound and no guarding.  Lymphadenopathy:    She has no cervical adenopathy.  Skin: Skin is warm and dry. No rash noted.    Psychiatric: Very tearful and anxious.    Assessment & Plan:   1. ANXIETY  >25 min spent with face to face with patient, >50% counseling and/or coordinating care. Anxiety has clearly deteriorated. Refilled her Ativan, discussed her breathing techniques. Also advised keeping appts with Dr. Laymond Purser, consider twice weekly appts if possible. The patient indicates understanding of these issues and agrees with the plan.     2. Esophageal reflux   Continue Omeprazole. Discussed foods that worsen GERD.

## 2011-10-06 ENCOUNTER — Ambulatory Visit (INDEPENDENT_AMBULATORY_CARE_PROVIDER_SITE_OTHER): Payer: 59 | Admitting: Psychology

## 2011-10-06 DIAGNOSIS — F331 Major depressive disorder, recurrent, moderate: Secondary | ICD-10-CM

## 2011-10-06 DIAGNOSIS — F411 Generalized anxiety disorder: Secondary | ICD-10-CM

## 2011-10-13 ENCOUNTER — Ambulatory Visit: Payer: 59 | Admitting: Psychology

## 2011-10-13 ENCOUNTER — Ambulatory Visit (INDEPENDENT_AMBULATORY_CARE_PROVIDER_SITE_OTHER): Payer: 59 | Admitting: Family Medicine

## 2011-10-13 ENCOUNTER — Encounter: Payer: Self-pay | Admitting: Family Medicine

## 2011-10-13 VITALS — BP 130/80 | HR 66 | Temp 98.3°F | Ht 65.0 in | Wt 193.0 lb

## 2011-10-13 DIAGNOSIS — J069 Acute upper respiratory infection, unspecified: Secondary | ICD-10-CM

## 2011-10-13 MED ORDER — BENZONATATE 100 MG PO CAPS: 100.0000 mg | ORAL_CAPSULE | Freq: Four times a day (QID) | ORAL | Status: DC | PRN

## 2011-10-13 NOTE — Progress Notes (Signed)
SUBJECTIVE:  Lacey Decker is a 40 y.o. female who complains of coryza, congestion and dry cough for 4 days. She denies a history of anorexia, chest pain, dizziness, fevers, myalgias and shortness of breath and denies a history of asthma. Patient denies smoke cigarettes.  Patient Active Problem List  Diagnoses  . MRSA  . DSORD BIPOLAR I, UNSPC, MOST RECENT EPSD  . ANXIETY  . PANIC ATTACKS  . MIGRAINE, COMMON, INTRACTABLE  . TRIGEMINAL NEURALGIA  . VERTEBROBASILAR INSUFFICIENCY  . Esophageal Reflux  . DYSFUNCTIONAL UTERINE BLEEDING  . ROSACEA  . OBESITY, UNSPECIFIED  . ALLERGIC RHINITIS  . LEG PAIN, RIGHT  . CHEST PAIN, ATYPICAL  . Fatigue  . Skin lesion of left lower limb  . Diarrhea  . Otitis  . Exposure to venereal disease  . Neck pain  . Vaginal burning  . Sinus congestion   Past Medical History  Diagnosis Date  . Chlamydia     as a teen  . Anxiety   . Bipolar 1 disorder   . MRSA (methicillin resistant staph aureus) culture positive   . Alcohol abuse     last drink one year ago   Past Surgical History  Procedure Date  . Btl 11/09/1998  . Leep 2000  . Dilation and curettage of uterus 1997    after miscarriage  . Misscarriage     x5  . Nsvd     3  . Neuroma surgery     lipoma removal Right labia   History  Substance Use Topics  . Smoking status: Never Smoker   . Smokeless tobacco: Not on file  . Alcohol Use: No   Family History  Problem Relation Age of Onset  . Hypertension Mother   . Hyperlipidemia Mother   . Depression Mother   . Bipolar disorder Sister   . Depression Maternal Grandmother    Allergies  Allergen Reactions  . Clarithromycin     REACTION: Trouble breathing - stomach upset - bad taste in mouth  . Codeine     REACTION: anti phylactic shock  . Codeine Phosphate     REACTION: unspecified  . Morphine     REACTION: swelling  . Morphine Sulfate     REACTION: unspecified  . Sulfamethoxazole W/Trimethoprim     REACTION: N \\T \ V,  rash   Current Outpatient Prescriptions on File Prior to Visit  Medication Sig Dispense Refill  . fluticasone (FLONASE) 50 MCG/ACT nasal spray 2 sprays by Nasal route daily.        Marland Kitchen LORazepam (ATIVAN) 1 MG tablet Take 1 tablet (1 mg total) by mouth as needed.  90 tablet  0  . omeprazole (PRILOSEC) 40 MG capsule Take 40 mg by mouth daily.         The PMH, PSH, Social History, Family History, Medications, and allergies have been reviewed in [facility], and have been updated if relevant.   OBJECTIVE: BP 130/80  Pulse 66  Temp(Src) 98.3 F (36.8 C) (Oral)  Ht 5\' 5"  (1.651 m)  Wt 193 lb (87.544 kg)  BMI 32.12 kg/m2  She appears well, vital signs are as noted. Ears normal.  Throat and pharynx normal.  Neck supple. No adenopathy in the neck. Nose is congested. Sinuses non tender. The chest is clear, without wheezes or rales.  ASSESSMENT:  viral upper respiratory illness  PLAN: Symptomatic therapy suggested: push fluids, rest and return office visit prn if symptoms persist or worsen. Lack of antibiotic effectiveness discussed with her. Call  or return to clinic prn if these symptoms worsen or fail to improve as anticipated.

## 2011-10-27 ENCOUNTER — Ambulatory Visit (INDEPENDENT_AMBULATORY_CARE_PROVIDER_SITE_OTHER): Payer: 59 | Admitting: Psychology

## 2011-10-27 DIAGNOSIS — F331 Major depressive disorder, recurrent, moderate: Secondary | ICD-10-CM

## 2011-10-27 DIAGNOSIS — F411 Generalized anxiety disorder: Secondary | ICD-10-CM

## 2011-11-18 ENCOUNTER — Ambulatory Visit: Payer: 59 | Admitting: Psychology

## 2011-12-02 ENCOUNTER — Ambulatory Visit (INDEPENDENT_AMBULATORY_CARE_PROVIDER_SITE_OTHER): Payer: 59 | Admitting: Psychology

## 2011-12-02 ENCOUNTER — Ambulatory Visit (INDEPENDENT_AMBULATORY_CARE_PROVIDER_SITE_OTHER): Payer: 59 | Admitting: Family Medicine

## 2011-12-02 ENCOUNTER — Encounter: Payer: Self-pay | Admitting: Family Medicine

## 2011-12-02 VITALS — BP 114/82 | HR 72 | Temp 98.1°F | Ht 65.0 in | Wt 183.2 lb

## 2011-12-02 DIAGNOSIS — F331 Major depressive disorder, recurrent, moderate: Secondary | ICD-10-CM

## 2011-12-02 DIAGNOSIS — L989 Disorder of the skin and subcutaneous tissue, unspecified: Secondary | ICD-10-CM

## 2011-12-02 DIAGNOSIS — F411 Generalized anxiety disorder: Secondary | ICD-10-CM

## 2011-12-02 DIAGNOSIS — R21 Rash and other nonspecific skin eruption: Secondary | ICD-10-CM | POA: Insufficient documentation

## 2011-12-02 DIAGNOSIS — Z23 Encounter for immunization: Secondary | ICD-10-CM

## 2011-12-02 MED ORDER — ALPRAZOLAM 0.5 MG PO TABS: 0.5000 mg | ORAL_TABLET | Freq: Every day | ORAL | Status: AC | PRN

## 2011-12-02 NOTE — Patient Instructions (Signed)
Use the xanax only for severe anxiety as needed- do not use it regularly Follow up with Dr Ermalene Searing in the next 1-2 weeks please  Continue counseling  The area on skin does not look concerning to me today  Keep your regular dermatology follow ups  If you feel worse - or in crisis- go to [facility] ER
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
## 2011-12-02 NOTE — Progress Notes (Signed)
Subjective:    Patient ID: Lacey Decker, female    DOB: 1971-07-27, 41 y.o.   MRN: [id number]  HPI Has a new spot on skin she is worried about spot on her bottom -- just popped up  She saw it 3 weeks ago - thought it was a bruise  Has had several removed - with dysplastic cells in the past   Dermatologist cannot see her for 3 weeks -[facility]    Has lost 30 lb -- 10 lb since the last visit   Has hx of bipolar  Has not seen psychiatrist in a while Sees Dr Laymond Purser for counseling  Bipolar is in fair control but her anxiety - is worse lately  Just went through a divorce after 20 years (he was alcoholic )  She herself has not drank in 8 years  Cannot talk about it with her family   Very anxious Not eating well  Not sleeping well  A huge transition  Takes care of 3 kids and a grandchild   Has had xanax in the past -- and it worked well at a low dose --   Will see Dr Laymond Purser today - at 5:30   Patient Active Problem List  Diagnoses  . MRSA  . DSORD BIPOLAR I, UNSPC, MOST RECENT EPSD  . ANXIETY  . PANIC ATTACKS  . MIGRAINE, COMMON, INTRACTABLE  . TRIGEMINAL NEURALGIA  . VERTEBROBASILAR INSUFFICIENCY  . Esophageal Reflux  . DYSFUNCTIONAL UTERINE BLEEDING  . ROSACEA  . OBESITY, UNSPECIFIED  . ALLERGIC RHINITIS  . LEG PAIN, RIGHT  . CHEST PAIN, ATYPICAL  . Fatigue  . Skin lesion of left lower limb  . Diarrhea  . Otitis  . Exposure to venereal disease  . Neck pain  . Vaginal burning  . Sinus congestion  . Skin lesion   Past Medical History  Diagnosis Date  . Chlamydia     as a teen  . Anxiety   . Bipolar 1 disorder   . MRSA (methicillin resistant staph aureus) culture positive   . Alcohol abuse     last drink one year ago   Past Surgical History  Procedure Date  . Btl 11/09/1998  . Leep 2000  . Dilation and curettage of uterus 1997    after miscarriage  . Misscarriage     x5  . Nsvd     3  . Neuroma surgery     lipoma removal Right labia    History  Substance Use Topics  . Smoking status: Never Smoker   . Smokeless tobacco: Not on file  . Alcohol Use: No   Family History  Problem Relation Age of Onset  . Hypertension Mother   . Hyperlipidemia Mother   . Depression Mother   . Bipolar disorder Sister   . Depression Maternal Grandmother    Allergies  Allergen Reactions  . Clarithromycin     REACTION: Trouble breathing - stomach upset - bad taste in mouth  . Codeine     REACTION: anti phylactic shock  . Codeine Phosphate     REACTION: unspecified  . Morphine     REACTION: swelling  . Morphine Sulfate     REACTION: unspecified  . Sulfamethoxazole W/Trimethoprim     REACTION: N \\T \ V, rash   Current Outpatient Prescriptions on File Prior to Visit  Medication Sig Dispense Refill  . fluticasone (FLONASE) 50 MCG/ACT nasal spray 2 sprays by Nasal route daily.        Marland Kitchen  benzonatate (TESSALON PERLES) 100 MG capsule Take 1 capsule (100 mg total) by mouth every 6 (six) hours as needed for cough.  30 capsule  0  . omeprazole (PRILOSEC) 40 MG capsule Take 40 mg by mouth daily.                Review of Systems Review of Systems  Constitutional: Negative for fever, appetite change, fatigue and unexpected weight change.  Eyes: Negative for pain and visual disturbance.  Respiratory: Negative for cough and shortness of breath.   Cardiovascular: Negative for cp or palpitations    Gastrointestinal: Negative for nausea, diarrhea and constipation.  Genitourinary: Negative for urgency and frequency.  Skin: Negative for pallor or rash  pos for lesion  Neurological: Negative for weakness, light-headedness, numbness and headaches.  Hematological: Negative for adenopathy. Does not bruise/bleed easily.  Psychiatric/Behavioral: Negative for dysphoric mood. The patient is very anxious , no SI        Objective:   Physical Exam  Constitutional: She appears well-developed and well-nourished. No distress.       overwt and anx  appearing   HENT:  Head: Normocephalic and atraumatic.  Mouth/Throat: Oropharynx is clear and moist.  Eyes: Conjunctivae and EOM are normal. Pupils are equal, round, and reactive to light.  Neck: Normal range of motion. Neck supple. No thyromegaly present.  Cardiovascular: Normal rate, regular rhythm and normal heart sounds.   Pulmonary/Chest: Effort normal and breath sounds normal. No respiratory distress. She has no wheezes.  Musculoskeletal: She exhibits no edema.  Lymphadenopathy:    She has no cervical adenopathy.  Neurological: She is alert. She has normal reflexes. She displays tremor. She exhibits normal muscle tone.  Skin: Skin is warm and dry. No rash noted. No erythema. No pallor.       L inner buttock .5 cm area of very slightly bluish discoloration Not raised or traumatized   Psychiatric: Her mood appears anxious. Her affect is labile. Her affect is not angry, not blunt and not inappropriate. Her speech is rapid and/or pressured and tangential. Her speech is not slurred. She is is hyperactive. She is not aggressive and not actively hallucinating. Cognition and memory are normal. She does not exhibit a depressed mood. She expresses no suicidal plans and no homicidal plans.       Very anxious and often tearful She is attentive.          Assessment & Plan:

## 2011-12-02 NOTE — Assessment & Plan Note (Signed)
The area pt pointed out looks to be venous - somewhat translucent/ perhaps early varicosity  (less likely blue nevus or venous lake) I told her I am not concerned but adv to have her dermatologist re check it the next time she is there  She was very anxious about it and in her own words obsessing over it

## 2011-12-16 ENCOUNTER — Ambulatory Visit (INDEPENDENT_AMBULATORY_CARE_PROVIDER_SITE_OTHER): Payer: 59 | Admitting: Psychology

## 2011-12-16 DIAGNOSIS — F411 Generalized anxiety disorder: Secondary | ICD-10-CM

## 2011-12-16 DIAGNOSIS — F331 Major depressive disorder, recurrent, moderate: Secondary | ICD-10-CM

## 2011-12-29 ENCOUNTER — Ambulatory Visit (INDEPENDENT_AMBULATORY_CARE_PROVIDER_SITE_OTHER): Payer: 59 | Admitting: Psychology

## 2011-12-29 DIAGNOSIS — F411 Generalized anxiety disorder: Secondary | ICD-10-CM

## 2011-12-29 DIAGNOSIS — F331 Major depressive disorder, recurrent, moderate: Secondary | ICD-10-CM

## 2012-01-05 ENCOUNTER — Ambulatory Visit (INDEPENDENT_AMBULATORY_CARE_PROVIDER_SITE_OTHER): Payer: 59 | Admitting: Psychology

## 2012-01-05 DIAGNOSIS — F411 Generalized anxiety disorder: Secondary | ICD-10-CM

## 2012-01-05 DIAGNOSIS — F331 Major depressive disorder, recurrent, moderate: Secondary | ICD-10-CM

## 2012-01-08 ENCOUNTER — Ambulatory Visit (INDEPENDENT_AMBULATORY_CARE_PROVIDER_SITE_OTHER): Payer: 59 | Admitting: Family Medicine

## 2012-01-08 ENCOUNTER — Encounter: Payer: Self-pay | Admitting: Family Medicine

## 2012-01-08 VITALS — BP 130/70 | HR 62 | Temp 98.1°F | Ht 65.0 in | Wt 176.4 lb

## 2012-01-08 DIAGNOSIS — K219 Gastro-esophageal reflux disease without esophagitis: Secondary | ICD-10-CM

## 2012-01-08 DIAGNOSIS — M549 Dorsalgia, unspecified: Secondary | ICD-10-CM

## 2012-01-08 DIAGNOSIS — R1011 Right upper quadrant pain: Secondary | ICD-10-CM | POA: Insufficient documentation

## 2012-01-08 LAB — POCT URINALYSIS DIPSTICK
Bilirubin, UA: NEGATIVE
Ketones, UA: NEGATIVE
Leukocytes, UA: NEGATIVE
Protein, UA: NEGATIVE
Spec Grav, UA: 1.02
pH, UA: 6

## 2012-01-08 NOTE — Progress Notes (Signed)
Subjective:    Patient ID: Lacey Decker, female    DOB: 1971-04-04, 41 y.o.   MRN: [id number]  HPI 41 yo pt of Dr. Ermalene Searing well known to me with h/o bipolar d/o, anxiety, self described hypochondriasis here for:  RUQ, intermittent pain x 2 week.  This morning back started hurting as well. Worsened by eating.  Some mild nausea, no vomiting. No fevers.  H/o GERD- takes Dexilant daily but this pain feels different.  Appetite has decreased, has lost 30 pounds but she thinks that is due to anxiety. No changes in bowel habits.  Very anxious    Patient Active Problem List  Diagnoses  . MRSA  . DSORD BIPOLAR I, UNSPC, MOST RECENT EPSD  . ANXIETY  . PANIC ATTACKS  . MIGRAINE, COMMON, INTRACTABLE  . TRIGEMINAL NEURALGIA  . VERTEBROBASILAR INSUFFICIENCY  . Esophageal Reflux  . DYSFUNCTIONAL UTERINE BLEEDING  . ROSACEA  . OBESITY, UNSPECIFIED  . ALLERGIC RHINITIS  . LEG PAIN, RIGHT  . CHEST PAIN, ATYPICAL  . Fatigue  . Skin lesion of left lower limb  . Diarrhea  . Otitis  . Exposure to venereal disease  . Neck pain  . Vaginal burning  . Sinus congestion  . Skin lesion   Past Medical History  Diagnosis Date  . Chlamydia     as a teen  . Anxiety   . Bipolar 1 disorder   . MRSA (methicillin resistant staph aureus) culture positive   . Alcohol abuse     last drink one year ago   Past Surgical History  Procedure Date  . Btl 11/09/1998  . Leep 2000  . Dilation and curettage of uterus 1997    after miscarriage  . Misscarriage     x5  . Nsvd     3  . Neuroma surgery     lipoma removal Right labia   History  Substance Use Topics  . Smoking status: Never Smoker   . Smokeless tobacco: Not on file  . Alcohol Use: No   Family History  Problem Relation Age of Onset  . Hypertension Mother   . Hyperlipidemia Mother   . Depression Mother   . Bipolar disorder Sister   . Depression Maternal Grandmother    Allergies  Allergen Reactions  . Clarithromycin    REACTION: Trouble breathing - stomach upset - bad taste in mouth  . Codeine     REACTION: anti phylactic shock  . Codeine Phosphate     REACTION: unspecified  . Morphine     REACTION: swelling  . Morphine Sulfate     REACTION: unspecified  . Sulfamethoxazole W/Trimethoprim     REACTION: N \\T \ V, rash   Current Outpatient Prescriptions on File Prior to Visit  Medication Sig Dispense Refill  . benzonatate (TESSALON PERLES) 100 MG capsule Take 1 capsule (100 mg total) by mouth every 6 (six) hours as needed for cough.  30 capsule  0  . Dexlansoprazole (DEXILANT PO) Take 1 tablet by mouth daily.      . fluticasone (FLONASE) 50 MCG/ACT nasal spray 2 sprays by Nasal route daily.        Marland Kitchen omeprazole (PRILOSEC) 40 MG capsule Take 40 mg by mouth daily.            Review of Systems       Objective:   Physical Exam  There were no vitals taken for this visit.  Constitutional: She appears well-developed and well-nourished. No distress.  overwt and anx appearing   HENT:  Head: Normocephalic and atraumatic.  Mouth/Throat: Oropharynx is clear and moist.  Eyes: Conjunctivae and EOM are normal. Pupils are equal, round, and reactive to light.  Neck: Normal range of motion. Neck supple. No thyromegaly present.  Abd:  Soft, Mildly TTP RUQ, no rebound or guarding, pos BS Psychiatric: Her mood appears anxious. Her affect is labile. Her affect is not angry, not blunt and not inappropriate. Her speech is rapid and/or pressured and tangential. Her speech is not slurred. She is is hyperactive. She is not aggressive and not actively hallucinating. Cognition and memory are normal. She does not exhibit a depressed mood. She expresses no suicidal plans and no homicidal plans.       Very anxious and often tearful She is attentive.        Assessment & Plan:   1 Right upper quadrant pain  CBC with Differential, Hepatic Function Panel, US Abdomen Complete, CBC with Differential, Hepatic Function Panel    New- GERD vs biliary colic. Non toxic. Will order ultrasound of abdomen and labs for further evaluation. If symptoms worsen over the weekend, needs to go to ER. The patient indicates understanding of these issues and agrees with the plan.

## 2012-01-08 NOTE — Patient Instructions (Signed)
Good to see you. Please stop by to see Shirlee Limerick after you have your labs done.

## 2012-01-09 LAB — CBC WITH DIFFERENTIAL/PLATELET
Basophils Absolute: 0 10*3/uL (ref 0.0–0.2)
Eos: 1 % (ref 0–7)
HCT: 39.6 % (ref 34.0–46.6)
Hemoglobin: 13.5 g/dL (ref 11.1–15.9)
Lymphocytes Absolute: 1.9 10*3/uL (ref 0.7–4.5)
MCHC: 34.1 g/dL (ref 31.5–35.7)
MCV: 97 fL (ref 79–97)
Monocytes Absolute: 0.5 10*3/uL (ref 0.1–1.0)
Monocytes: 7 % (ref 4–13)
Neutrophils Absolute: 5.7 10*3/uL (ref 1.8–7.8)

## 2012-01-09 LAB — HEPATIC FUNCTION PANEL
ALT: 9 IU/L (ref 0–32)
AST: 13 IU/L (ref 0–40)
Albumin: 4.5 g/dL (ref 3.5–5.5)
Alkaline Phosphatase: 47 IU/L (ref 25–150)
Total Bilirubin: 0.3 mg/dL (ref 0.0–1.2)

## 2012-01-11 ENCOUNTER — Encounter: Payer: Self-pay | Admitting: Family Medicine

## 2012-01-11 ENCOUNTER — Ambulatory Visit: Payer: Self-pay | Admitting: Family Medicine

## 2012-01-12 ENCOUNTER — Ambulatory Visit (INDEPENDENT_AMBULATORY_CARE_PROVIDER_SITE_OTHER): Payer: 59 | Admitting: Psychology

## 2012-01-12 DIAGNOSIS — F411 Generalized anxiety disorder: Secondary | ICD-10-CM

## 2012-01-12 DIAGNOSIS — F331 Major depressive disorder, recurrent, moderate: Secondary | ICD-10-CM

## 2012-01-19 ENCOUNTER — Ambulatory Visit: Payer: 59 | Admitting: Psychology

## 2012-01-27 ENCOUNTER — Ambulatory Visit: Payer: 59 | Admitting: Psychology

## 2012-02-03 ENCOUNTER — Ambulatory Visit: Payer: 59 | Admitting: Psychology

## 2012-02-09 ENCOUNTER — Ambulatory Visit: Payer: 59 | Admitting: Psychology

## 2012-02-24 ENCOUNTER — Ambulatory Visit: Payer: 59 | Admitting: Psychology

## 2012-03-01 ENCOUNTER — Ambulatory Visit (INDEPENDENT_AMBULATORY_CARE_PROVIDER_SITE_OTHER): Payer: 59 | Admitting: Psychology

## 2012-03-01 DIAGNOSIS — F331 Major depressive disorder, recurrent, moderate: Secondary | ICD-10-CM

## 2012-03-01 DIAGNOSIS — F411 Generalized anxiety disorder: Secondary | ICD-10-CM

## 2012-03-17 ENCOUNTER — Encounter: Payer: Self-pay | Admitting: Family Medicine

## 2012-03-17 ENCOUNTER — Ambulatory Visit (INDEPENDENT_AMBULATORY_CARE_PROVIDER_SITE_OTHER): Payer: 59 | Admitting: Family Medicine

## 2012-03-17 VITALS — BP 120/78 | HR 67 | Temp 98.4°F | Ht 65.0 in | Wt 171.0 lb

## 2012-03-17 DIAGNOSIS — J019 Acute sinusitis, unspecified: Secondary | ICD-10-CM

## 2012-03-17 DIAGNOSIS — S060X9A Concussion with loss of consciousness of unspecified duration, initial encounter: Secondary | ICD-10-CM

## 2012-03-17 DIAGNOSIS — M542 Cervicalgia: Secondary | ICD-10-CM

## 2012-03-17 DIAGNOSIS — S060XAA Concussion with loss of consciousness status unknown, initial encounter: Secondary | ICD-10-CM

## 2012-03-17 MED ORDER — BACLOFEN 10 MG PO TABS: 10.0000 mg | ORAL_TABLET | Freq: Every day | ORAL | Status: AC

## 2012-03-17 MED ORDER — AMOXICILLIN 500 MG PO TABS: 500.0000 mg | ORAL_TABLET | Freq: Two times a day (BID) | ORAL | Status: AC

## 2012-03-17 NOTE — Assessment & Plan Note (Signed)
Treat with antibiotics given >7 days of symptoms. Nasal saline irrigation and guaifenesin if tolerated.

## 2012-03-17 NOTE — Assessment & Plan Note (Signed)
Possible in addition to simple contusion on head. No red flags. Suggested mental rest till symptoms gone and avoidance of repeat concussion during symptoms.
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
## 2012-03-17 NOTE — Assessment & Plan Note (Signed)
Refilled baclofen

## 2012-03-17 NOTE — Patient Instructions (Signed)
Treat allergies with zyrtec and nasal steroid. Complete course of amoxicillin. Mental rest for possible mild concussion.Marland Kitchen No TV, no computer, no phone , no reading until symptoms gone. Ice on bruise on head if needed.

## 2012-04-05 ENCOUNTER — Ambulatory Visit (INDEPENDENT_AMBULATORY_CARE_PROVIDER_SITE_OTHER): Payer: 59 | Admitting: Psychology

## 2012-04-05 DIAGNOSIS — F331 Major depressive disorder, recurrent, moderate: Secondary | ICD-10-CM

## 2012-04-05 DIAGNOSIS — F411 Generalized anxiety disorder: Secondary | ICD-10-CM

## 2012-04-13 ENCOUNTER — Ambulatory Visit (INDEPENDENT_AMBULATORY_CARE_PROVIDER_SITE_OTHER): Payer: 59 | Admitting: Psychology

## 2012-04-13 DIAGNOSIS — F411 Generalized anxiety disorder: Secondary | ICD-10-CM

## 2012-04-13 DIAGNOSIS — F331 Major depressive disorder, recurrent, moderate: Secondary | ICD-10-CM

## 2012-04-19 ENCOUNTER — Ambulatory Visit (INDEPENDENT_AMBULATORY_CARE_PROVIDER_SITE_OTHER): Payer: 59 | Admitting: Psychology

## 2012-04-19 DIAGNOSIS — F331 Major depressive disorder, recurrent, moderate: Secondary | ICD-10-CM

## 2012-04-19 DIAGNOSIS — F411 Generalized anxiety disorder: Secondary | ICD-10-CM

## 2012-04-21 ENCOUNTER — Emergency Department: Payer: Self-pay | Admitting: Emergency Medicine

## 2012-04-21 ENCOUNTER — Telehealth: Payer: Self-pay

## 2012-04-21 LAB — COMPREHENSIVE METABOLIC PANEL
Alkaline Phosphatase: 54 U/L (ref 50–136)
Anion Gap: 8 (ref 7–16)
BUN: 12 mg/dL (ref 7–18)
Calcium, Total: 8.8 mg/dL (ref 8.5–10.1)
Chloride: 106 mmol/L (ref 98–107)
Co2: 27 mmol/L (ref 21–32)
EGFR (African American): >60
Glucose: 100 mg/dL — ABNORMAL HIGH (ref 65–99)
Osmolality: 281 (ref 275–301)
Potassium: 4 mmol/L (ref 3.5–5.1)
SGOT(AST): 23 U/L (ref 15–37)
SGPT (ALT): 22 U/L
Sodium: 141 mmol/L (ref 136–145)
Total Protein: 6.8 g/dL (ref 6.4–8.2)

## 2012-04-21 LAB — DRUG SCREEN, URINE
Barbiturates, Ur Screen: NEGATIVE (ref ?–200)
Benzodiazepine, Ur Scrn: NEGATIVE (ref ?–200)
Tricyclic, Ur Screen: NEGATIVE (ref ?–1000)

## 2012-04-21 LAB — URINALYSIS, COMPLETE
Glucose,UR: NEGATIVE mg/dL (ref 0–75)
Leukocyte Esterase: NEGATIVE
Nitrite: NEGATIVE
Ph: 8 (ref 4.5–8.0)
RBC,UR: <1 /HPF (ref 0–5)
Specific Gravity: 1.004 (ref 1.003–1.030)
Squamous Epithelial: 1
WBC UR: <1 /HPF (ref 0–5)

## 2012-04-21 LAB — ACETAMINOPHEN LEVEL: Acetaminophen: <2 ug/mL

## 2012-04-21 LAB — TROPONIN I: Troponin-I: <0.02 ng/mL

## 2012-04-21 LAB — CBC
HCT: 40.3 % (ref 35.0–47.0)
HGB: 13.1 g/dL (ref 12.0–16.0)
MCHC: 32.4 g/dL (ref 32.0–36.0)
Platelet: 236 10*3/uL (ref 150–440)
RBC: 3.94 10*6/uL (ref 3.80–5.20)
RDW: 13.4 % (ref 11.5–14.5)

## 2012-04-21 LAB — ETHANOL
Ethanol %: <0.003 % (ref 0.000–0.080)
Ethanol: <3 mg/dL

## 2012-04-21 NOTE — Telephone Encounter (Signed)
Pt walked in crying; pt not sure if having panic attack or heart attack. Pt crying, mid upper chest pain, tingling lips and fingers, hot flashes and sweating; started 30 minutes ago; under a lot of stress this week. Pt took Xanax 0.5 mg 20 minutes ago with no relief. Pt said she thinks she is having a panic attack but not sure. Dr Dayton Martes advised call 911 to go to ED for evaluation. Pt said she is not going. Wiped tears from face and said I could call 911 but she will not be here. Thanked me and walked out.

## 2012-04-21 NOTE — Telephone Encounter (Signed)
To clarify- pt did state according to what I was told that "she may be having a heart attack."  Given that xanax typically helps with her panic attacks but did not help with chest pain, I did advise immediate ER evaluation.

## 2012-04-22 NOTE — Telephone Encounter (Signed)
Patients phone is turned off will try again later

## 2012-04-22 NOTE — Telephone Encounter (Signed)
Noted. Heather please call pt and find out how she is doing.

## 2012-04-27 NOTE — Telephone Encounter (Signed)
Unable to contact patient.

## 2012-05-10 ENCOUNTER — Ambulatory Visit (INDEPENDENT_AMBULATORY_CARE_PROVIDER_SITE_OTHER): Payer: 59 | Admitting: Psychology

## 2012-05-10 DIAGNOSIS — F411 Generalized anxiety disorder: Secondary | ICD-10-CM

## 2012-05-10 DIAGNOSIS — F331 Major depressive disorder, recurrent, moderate: Secondary | ICD-10-CM

## 2012-06-29 ENCOUNTER — Emergency Department (INDEPENDENT_AMBULATORY_CARE_PROVIDER_SITE_OTHER)
Admission: EM | Admit: 2012-06-29 | Discharge: 2012-06-29 | Disposition: A | Discharge status: Home / Self Care | Payer: 59 | Source: Home / Self Care | Attending: Emergency Medicine | Admitting: Emergency Medicine

## 2012-06-29 ENCOUNTER — Encounter (HOSPITAL_COMMUNITY): Payer: Self-pay

## 2012-06-29 DIAGNOSIS — K130 Diseases of lips: Secondary | ICD-10-CM

## 2012-06-29 DIAGNOSIS — S335XXA Sprain of ligaments of lumbar spine, initial encounter: Secondary | ICD-10-CM

## 2012-06-29 LAB — POCT URINALYSIS DIP (DEVICE)
Leukocytes, UA: NEGATIVE
Nitrite: NEGATIVE
Protein, ur: NEGATIVE mg/dL
Urobilinogen, UA: 0.2 mg/dL (ref 0.0–1.0)
pH: 6 (ref 5.0–8.0)

## 2012-06-29 MED ORDER — METHOCARBAMOL 500 MG PO TABS: 500.0000 mg | ORAL_TABLET | Freq: Every day | ORAL | Status: AC

## 2012-06-29 MED ORDER — DOCOSANOL 10 % EX CREA: TOPICAL_CREAM | CUTANEOUS | Status: DC

## 2012-06-29 MED ORDER — MELOXICAM 7.5 MG PO TABS: 7.5000 mg | ORAL_TABLET | Freq: Every day | ORAL | Status: DC

## 2012-06-29 NOTE — ED Notes (Addendum)
Pt reports pain in left lower back with movement, stated no pain at rest.  Symptoms started yesterday, pt denies any injury.  Reported by: Ali Lowe, RN

## 2012-06-29 NOTE — ED Provider Notes (Addendum)
History     CSN: [id number]  Arrival date & time 06/29/12  1333   First MD Initiated Contact with Patient 06/29/12 1409      Chief Complaint  Patient presents with  . Back Pain    (Consider location/radiation/quality/duration/timing/severity/associated sxs/prior treatment) HPI Comments: Patient returned from the beach, since yesterday been experiencing right-sided lower back pain. Denies any weakness, numbness or tingling sensations in any of her lower extremities. No abdominal pain or urinary symptoms. Pain is sharp in character and exacerbates with movement especially when leaning forward as well as when she walks. Patient is also requesting a prescription for a little rash that is kind of drying up on her lower lip.  Patient denies any constitutional symptoms such as fevers, generalized malaise, arthralgias myalgias or changes in appetite. Patient had taken some Motrin leftovers describes as 800 mg tablets since yesterday with minimal help.  Patient is a 41 y.o. female presenting with back pain. The history is provided by the patient.  Back Pain  This is a new problem. The problem occurs constantly. The problem has not changed since onset.The pain is associated with twisting. The pain is present in the lumbar spine. The pain is at a severity of 8/10. The pain is moderate. The symptoms are aggravated by bending, twisting and certain positions. Pertinent negatives include no fever, no numbness, no headaches, no abdominal pain, no abdominal swelling, no bladder incontinence, no dysuria, no pelvic pain, no leg pain, no paresthesias, no paresis, no tingling and no weakness. She has tried NSAIDs for the symptoms. The treatment provided no relief.    Past Medical History  Diagnosis Date  . Chlamydia     as a teen  . Anxiety   . Bipolar 1 disorder   . MRSA (methicillin resistant staph aureus) culture positive   . Alcohol abuse     last drink one year ago    Past Surgical History    Procedure Date  . Btl 11/09/1998  . Leep 2000  . Dilation and curettage of uterus 1997    after miscarriage  . Misscarriage     x5  . Nsvd     3  . Neuroma surgery     lipoma removal Right labia    Family History  Problem Relation Age of Onset  . Hypertension Mother   . Hyperlipidemia Mother   . Depression Mother   . Bipolar disorder Sister   . Depression Maternal Grandmother     History  Substance Use Topics  . Smoking status: Never Smoker   . Smokeless tobacco: Not on file  . Alcohol Use: No    OB History    Grav Para Term Preterm Abortions TAB SAB Ect Mult Living                  Review of Systems  Constitutional: Negative for fever, chills, diaphoresis, activity change, appetite change and fatigue.  Gastrointestinal: Negative for abdominal pain.  Genitourinary: Negative for bladder incontinence, dysuria and pelvic pain.  Musculoskeletal: Positive for back pain. Negative for joint swelling, arthralgias and gait problem.  Skin: Negative for wound.  Neurological: Negative for tingling, weakness, numbness, headaches and paresthesias.    Allergies  Clarithromycin; Codeine; Codeine phosphate; Morphine; Morphine sulfate; and Sulfamethoxazole w-trimethoprim  Home Medications   Current Outpatient Rx  Name Route Sig Dispense Refill  . OMEPRAZOLE 40 MG PO CPDR Oral Take 40 mg by mouth daily.    . DOCOSANOL 10 % EX CREA  Apply  tid x 5 days affected lip 2 g 0  . MELOXICAM 7.5 MG PO TABS Oral Take 1 tablet (7.5 mg total) by mouth daily. 14 tablet 0  . METHOCARBAMOL 500 MG PO TABS Oral Take 1 tablet (500 mg total) by mouth at bedtime. 5 tablet 0    BP 133/82  Pulse 72  Temp 99.1 F (37.3 C) (Oral)  Resp 18  SpO2 100%  Physical Exam  Nursing note and vitals reviewed. Constitutional: Vital signs are normal. She appears well-developed and well-nourished.  Non-toxic appearance. She does not have a sickly appearance. She does not appear ill. No distress.  HENT:   Head: Normocephalic.  Eyes: Conjunctivae are normal.  Neck: Neck supple.  Pulmonary/Chest: No respiratory distress. She has no wheezes. She has no rales. She exhibits no tenderness.  Abdominal: Soft. Normal appearance. There is no rigidity, no guarding, no CVA tenderness, no tenderness at McBurney's point and negative Murphy's sign.  Musculoskeletal: She exhibits tenderness.       Lumbar back: She exhibits tenderness, pain and spasm. She exhibits no bony tenderness, no swelling, no edema, no deformity, no laceration and normal pulse.       Back:  Neurological: She is alert.  Skin: Skin is warm. No abrasion and no rash noted. Rash is not papular and not vesicular. She is not diaphoretic. No erythema.    ED Course  Procedures (including critical care time)  Labs Reviewed  POCT URINALYSIS DIP (DEVICE) - Abnormal; Notable for the following:    Hgb urine dipstick TRACE (*)     All other components within normal limits  POCT PREGNANCY, URINE   No results found.   1. Lumbar back sprain   2. Intertrigo labialis       MDM   Left sided paravertebral paraspinal reproducible pain. No neural muscular deficits on exam. Have prescribed patient will meloxicam course of 2 weeks with a nocturnal dose of muscle relaxer encourage her to followup with the orthopedic Dr. if pain persists beyond 10-14 days.       Lacey Molly, MD 06/29/12 1616  Lacey Molly, MD 06/29/12 [phone]

## 2012-07-18 ENCOUNTER — Ambulatory Visit (INDEPENDENT_AMBULATORY_CARE_PROVIDER_SITE_OTHER): Payer: 59 | Admitting: Family Medicine

## 2012-07-18 ENCOUNTER — Encounter: Payer: Self-pay | Admitting: Family Medicine

## 2012-07-18 VITALS — BP 102/60 | HR 79 | Temp 99.0°F | Wt 166.0 lb

## 2012-07-18 DIAGNOSIS — H10029 Other mucopurulent conjunctivitis, unspecified eye: Secondary | ICD-10-CM

## 2012-07-18 MED ORDER — MOXIFLOXACIN HCL 0.5 % OP SOLN: 1.0000 [drp] | Freq: Three times a day (TID) | OPHTHALMIC | Status: DC

## 2012-07-18 NOTE — Progress Notes (Signed)
   Nature conservation officer at [facility] [address] [place] [address] Phone: [phone] Fax: [phone]  Date:  07/18/2012   Name:  Lacey Decker   DOB:  [birth year]   MRN:  [id number] Gender: female Age: 41 y.o.  PCP:  [name], MD    Chief Complaint: Conjunctivitis   History of Present Illness:  Lacey Decker is a 41 y.o. very pleasant female patient who presents with the following:  The patient has been exposed to her granddaughter, who has pink eye, and she also has had some LEFT sided eye discomfort, pink, reddish discoloration of her sclera and conjunctiva. They've been crusted shut in the morning and painful.  Past Medical History, Surgical History, Social History, Family History, Problem List, Medications, and Allergies have been reviewed and updated if relevant.  Current Outpatient Prescriptions on File Prior to Visit  Medication Sig Dispense Refill  . omeprazole (PRILOSEC) 40 MG capsule Take 40 mg by mouth daily.        Review of Systems: ROS: GEN: Acute illness details above GI: Tolerating PO intake GU: maintaining adequate hydration and urination Pulm: No SOB Interactive and getting along well at home.  Otherwise, ROS is as per the HPI.   Physical Examination: Filed Vitals:   07/18/12 1549  BP: 102/60  Pulse: 79  Temp: 99 F (37.2 C)   Filed Vitals:   07/18/12 1549  Weight: 166 lb (75.297 kg)   There is no height on file to calculate BMI. Ideal Body Weight:     GEN: WDWN, NAD, Non-toxic, Alert & Oriented x 3 HEENT: Atraumatic, Normocephalic. LEFT eye with some puffiness around the lids, significantly injected and reddened appearance of conjunctiva and sclera. Tearful. Ears and Nose: No external deformity. EXTR: No clubbing/cyanosis/edema NEURO: Normal gait.  PSYCH: Normally interactive. Conversant. Not depressed or anxious appearing.  Calm demeanor.    Assessment and Plan:  1. Pink eye     Orders Today:  No orders of the  defined types were placed in this encounter.    Medications Today: (Includes new updates added during medication reconciliation) Meds ordered this encounter  Medications  . moxifloxacin (VIGAMOX) 0.5 % ophthalmic solution    Sig: Place 1 drop into the left eye 3 (three) times daily.    Dispense:  3 mL    Refill:  0    Medications Discontinued: Medications Discontinued During This Encounter  Medication Reason  . Docosanol (ABREVA) 10 % CREA Therapy completed  . meloxicam (MOBIC) 7.5 MG tablet Therapy completed     Hannah Beat, MD

## 2012-07-20 ENCOUNTER — Telehealth: Payer: Self-pay

## 2012-07-20 MED ORDER — ERYTHROMYCIN 5 MG/GM OP OINT: TOPICAL_OINTMENT | Freq: Every day | OPHTHALMIC | Status: DC

## 2012-07-20 NOTE — Telephone Encounter (Signed)
Sent in for her, apply q 4 hours for a week

## 2012-07-20 NOTE — Telephone Encounter (Signed)
Pt seen 07/18/12; pt could not afford moxifloxacin; pt request erthromycin cream for eyes or other med less expensive to [facility]. Left eye is swollen shut and now rt eye is red,itching,burning,swollen and draining.

## 2012-07-20 NOTE — Telephone Encounter (Signed)
Patient notified

## 2012-07-22 ENCOUNTER — Ambulatory Visit (INDEPENDENT_AMBULATORY_CARE_PROVIDER_SITE_OTHER): Payer: 59 | Admitting: Family Medicine

## 2012-07-22 ENCOUNTER — Encounter: Payer: Self-pay | Admitting: Family Medicine

## 2012-07-22 VITALS — BP 116/70 | HR 72 | Temp 98.4°F | Wt 169.0 lb

## 2012-07-22 DIAGNOSIS — H5789 Other specified disorders of eye and adnexa: Secondary | ICD-10-CM | POA: Insufficient documentation

## 2012-07-22 DIAGNOSIS — Z0100 Encounter for examination of eyes and vision without abnormal findings: Secondary | ICD-10-CM

## 2012-07-22 DIAGNOSIS — H547 Unspecified visual loss: Secondary | ICD-10-CM

## 2012-07-22 MED ORDER — OLOPATADINE HCL 0.1 % OP SOLN: 1.0000 [drp] | Freq: Two times a day (BID) | OPHTHALMIC | Status: DC

## 2012-07-22 NOTE — Patient Instructions (Addendum)
I think you had worsening reaction to eye drop - stop erythromycin ointment. Start regular over the counter lubricating eye drop - like rephresh or hypotears. Start patanol for allergic component. If worsening, please seek urgent care over the weekend. Given you have worsened vision out of left eye - I do want to send you to eye doctor - pass by [facility] for referral.

## 2012-07-22 NOTE — Progress Notes (Signed)
  Subjective:    Patient ID: Lacey Decker, female    DOB: Aug 21, 1971, 41 y.o.   MRN: [id number]  HPI CC: pink eye recheck  Very anxious.  5d ago seen by Dr. Patsy Lager.  Prescribed moxiflox eye drops but unable to afford so took daughter's drops (which were also moxiflox).   Finally prescribed erythromycin ointment and started taking 2 nights ago.  Yesterday felt weak and nauseated stayed out of work (first day out), then re applied erythromycin and when awoke at 4am, worse swelling, burning, redness of eye.  Feeling pressure around L eye.  Exposure to daughter, granddaughter, friend at work with pink eye.  No more fevers/chills, no congestion. ST, RN, snezing, coughing.    Describes decreased vision - endorses film over left eye.  Right eye vision normal.  No pain with eye movements.  No foreign body sensation in eye.  Past Medical History  Diagnosis Date  . Chlamydia     as a teen  . Anxiety   . Bipolar 1 disorder   . MRSA (methicillin resistant staph aureus) culture positive   . Alcohol abuse     last drink one year ago     Review of Systems Per HPI    Objective:   Physical Exam  Nursing note and vitals reviewed. Constitutional: She appears well-developed and well-nourished. No distress.  HENT:  Head: Normocephalic and atraumatic.  Right Ear: Hearing, tympanic membrane, external ear and ear canal normal.  Left Ear: Hearing, tympanic membrane, external ear and ear canal normal.  Nose: Mucosal edema present. No rhinorrhea. Right sinus exhibits no maxillary sinus tenderness and no frontal sinus tenderness. Left sinus exhibits no maxillary sinus tenderness and no frontal sinus tenderness.  Mouth/Throat: Uvula is midline, oropharynx is clear and moist and mucous membranes are normal. No oropharyngeal exudate, posterior oropharyngeal edema, posterior oropharyngeal erythema or tonsillar abscesses.  Eyes: EOM are normal. Pupils are equal, round, and reactive to light. Right  conjunctiva is injected. Right conjunctiva has no hemorrhage. Left conjunctiva is injected. Left conjunctiva has no hemorrhage. Right eye exhibits normal extraocular motion and no nystagmus. Left eye exhibits normal extraocular motion and no nystagmus.       Left bulbar and palpebral conjunctival injection and swelling. Periorbital swelling present.  No pain. R mild injection PERRLA.   Able to move eyes without pain.  EOMI. No limbic sparing.  Neck: Normal range of motion. Neck supple.  Lymphadenopathy:    She has no cervical adenopathy.       Assessment & Plan:

## 2012-07-22 NOTE — Assessment & Plan Note (Addendum)
After presumed viral conjunctivitis earlier this week. Took daughter's vigamox eye drops, then acutely worsened when started erythromycin ophthalmic ointment. Anticipate allergic reaction to vigamox.  rec stop.  Start patanol and lubricating eye drops. I don't think there's further bacterial infection given discharge is very watery. Decreased visual acuity - will refer to ophtho for further eval/ r/o other viral conjunctivitis.

## 2012-09-07 ENCOUNTER — Ambulatory Visit (INDEPENDENT_AMBULATORY_CARE_PROVIDER_SITE_OTHER): Payer: 59 | Admitting: Psychology

## 2012-09-07 DIAGNOSIS — F411 Generalized anxiety disorder: Secondary | ICD-10-CM

## 2012-09-07 DIAGNOSIS — F331 Major depressive disorder, recurrent, moderate: Secondary | ICD-10-CM

## 2012-09-21 ENCOUNTER — Ambulatory Visit (INDEPENDENT_AMBULATORY_CARE_PROVIDER_SITE_OTHER): Payer: 59 | Admitting: Psychology

## 2012-09-21 DIAGNOSIS — F331 Major depressive disorder, recurrent, moderate: Secondary | ICD-10-CM

## 2012-09-21 DIAGNOSIS — F411 Generalized anxiety disorder: Secondary | ICD-10-CM

## 2012-09-27 ENCOUNTER — Ambulatory Visit: Payer: Self-pay | Admitting: Psychology

## 2012-10-11 ENCOUNTER — Ambulatory Visit: Payer: Self-pay | Admitting: Psychology

## 2012-10-18 ENCOUNTER — Ambulatory Visit (INDEPENDENT_AMBULATORY_CARE_PROVIDER_SITE_OTHER): Payer: 59 | Admitting: Psychology

## 2012-10-18 DIAGNOSIS — F331 Major depressive disorder, recurrent, moderate: Secondary | ICD-10-CM

## 2012-10-18 DIAGNOSIS — F411 Generalized anxiety disorder: Secondary | ICD-10-CM

## 2012-10-25 ENCOUNTER — Ambulatory Visit: Payer: Self-pay | Admitting: Psychology

## 2012-11-10 ENCOUNTER — Ambulatory Visit: Payer: Self-pay | Admitting: Family Medicine

## 2012-11-14 ENCOUNTER — Encounter: Payer: Self-pay | Admitting: Family Medicine

## 2012-11-15 ENCOUNTER — Ambulatory Visit (INDEPENDENT_AMBULATORY_CARE_PROVIDER_SITE_OTHER): Payer: 59 | Admitting: Psychology

## 2012-11-15 DIAGNOSIS — F331 Major depressive disorder, recurrent, moderate: Secondary | ICD-10-CM

## 2012-11-15 DIAGNOSIS — F411 Generalized anxiety disorder: Secondary | ICD-10-CM

## 2012-11-22 ENCOUNTER — Ambulatory Visit: Payer: 59 | Admitting: Psychology

## 2012-11-29 ENCOUNTER — Ambulatory Visit (INDEPENDENT_AMBULATORY_CARE_PROVIDER_SITE_OTHER): Payer: 59 | Admitting: Psychology

## 2012-11-29 DIAGNOSIS — F331 Major depressive disorder, recurrent, moderate: Secondary | ICD-10-CM

## 2012-11-29 DIAGNOSIS — F411 Generalized anxiety disorder: Secondary | ICD-10-CM

## 2012-12-13 ENCOUNTER — Ambulatory Visit (INDEPENDENT_AMBULATORY_CARE_PROVIDER_SITE_OTHER): Payer: 59 | Admitting: Psychology

## 2012-12-13 DIAGNOSIS — F411 Generalized anxiety disorder: Secondary | ICD-10-CM

## 2012-12-13 DIAGNOSIS — F331 Major depressive disorder, recurrent, moderate: Secondary | ICD-10-CM

## 2012-12-14 ENCOUNTER — Encounter: Payer: Self-pay | Admitting: Family Medicine

## 2012-12-14 ENCOUNTER — Ambulatory Visit (INDEPENDENT_AMBULATORY_CARE_PROVIDER_SITE_OTHER): Payer: 59 | Admitting: Family Medicine

## 2012-12-14 VITALS — BP 120/64 | HR 65 | Temp 98.8°F | Ht 65.0 in | Wt 180.8 lb

## 2012-12-14 DIAGNOSIS — J321 Chronic frontal sinusitis: Secondary | ICD-10-CM

## 2012-12-14 MED ORDER — FLUTICASONE PROPIONATE 50 MCG/ACT NA SUSP: 2.0000 | Freq: Every day | NASAL | Status: DC

## 2012-12-14 MED ORDER — AMOXICILLIN 500 MG PO CAPS: 1000.0000 mg | ORAL_CAPSULE | Freq: Two times a day (BID) | ORAL | Status: DC

## 2012-12-14 MED ORDER — FLUCONAZOLE 150 MG PO TABS: 150.0000 mg | ORAL_TABLET | Freq: Once | ORAL | Status: DC

## 2012-12-14 NOTE — Progress Notes (Signed)
Nature conservation officer at [facility] [address] [place] [address] Phone: [phone] Fax: [phone]  Date:  12/14/2012   Name:  JENILLE LASZLO   DOB:  06-15-1971   MRN:  [id number] Gender: female Age: 42 y.o.  Primary Physician:  [name], MD  Evaluating MD: Hannah Beat, MD   Chief Complaint: Sinusitis   History of Present Illness:  MALINI FLEMINGS is a 42 y.o. pleasant patient who presents with the following:  Feels like has a sinus infection. Allergies are acting up and eyes feel like going to pop out of head. Headache. 2 weeks of symptoms and has been steadily getting worse. Severe congestion, colored mucous, severe HA, pain behind eyes and teeth. Worst frontral R.  No significant cough. No n/v/d.  Patient Active Problem List  Diagnosis  . MRSA  . DSORD BIPOLAR I, UNSPC, MOST RECENT EPSD  . ANXIETY  . PANIC ATTACKS  . MIGRAINE, COMMON, INTRACTABLE  . VERTEBROBASILAR INSUFFICIENCY  . Esophageal Reflux  . DYSFUNCTIONAL UTERINE BLEEDING  . ROSACEA  . OBESITY, UNSPECIFIED  . ALLERGIC RHINITIS  . Otitis  . Neck pain  . Right upper quadrant pain  . Mild concussion  . Red eye    Past Medical History  Diagnosis Date  . Chlamydia     as a teen  . Anxiety   . Bipolar 1 disorder   . MRSA (methicillin resistant staph aureus) culture positive   . Alcohol abuse     last drink one year ago    Past Surgical History  Procedure Date  . Btl 11/09/1998  . Leep 2000  . Dilation and curettage of uterus 1997    after miscarriage  . Misscarriage     x5  . Nsvd     3  . Neuroma surgery     lipoma removal Right labia    History  Substance Use Topics  . Smoking status: Never Smoker   . Smokeless tobacco: Never Used  . Alcohol Use: No    Family History  Problem Relation Age of Onset  . Hypertension Mother   . Hyperlipidemia Mother   . Depression Mother   . Bipolar disorder Sister   . Depression Maternal Grandmother     Allergies    Allergen Reactions  . Clarithromycin     REACTION: Trouble breathing - stomach upset - bad taste in mouth  . Codeine     REACTION: anti phylactic shock  . Codeine Phosphate     REACTION: unspecified  . Erythromycin Base Other (See Comments)    Eye ointment - worsened conjunctivitis and swelling  . Morphine     REACTION: swelling  . Morphine Sulfate     REACTION: unspecified  . Sulfamethoxazole W-Trimethoprim     REACTION: N \\T \ V, rash    Medication list has been reviewed and updated.  Outpatient Prescriptions Prior to Visit  Medication Sig Dispense Refill  . cetirizine (ZYRTEC) 10 MG tablet Take 10 mg by mouth daily.      Marland Kitchen omeprazole (PRILOSEC) 40 MG capsule Take 40 mg by mouth daily.      . [DISCONTINUED] olopatadine (PATANOL) 0.1 % ophthalmic solution Place 1 drop into both eyes 2 (two) times daily.  5 mL  12   Last reviewed on 12/14/2012  3:51 PM by Consuello Masse, CMA  Review of Systems:  ROS: GEN: Acute illness details above GI: Tolerating PO intake GU: maintaining adequate hydration and urination Pulm: No SOB Interactive  and getting along well at home.  Otherwise, ROS is as per the HPI.   Physical Examination: BP 120/64  Pulse 65  Temp 98.8 F (37.1 C) (Oral)  Ht 5\' 5"  (1.651 m)  Wt 180 lb 12 oz (81.988 kg)  BMI 30.08 kg/m2  SpO2 97%  Ideal Body Weight: Weight in (lb) to have BMI = 25: 149.9    Gen: WDWN, NAD; alert,appropriate and cooperative throughout exam  HEENT: Normocephalic and atraumatic. Throat clear, w/o exudate, no LAD, R TM clear, L TM - good landmarks, No fluid present. rhinnorhea.  Left frontal and maxillary sinuses: Tender Right frontal and maxillary sinuses: Tender- most frontal on the R  Neck: No ant or post LAD CV: RRR, No M/G/R Pulm: Breathing comfortably in no resp distress. no w/c/r Abd: S,NT,ND,+BS Extr: no c/c/e Psych: full affect, pleasant   Assessment and Plan:  1. Frontal sinusitis   Acute sinusitis: ABX as  below.  Refer to the patient instructions sections for details of plan shared with patient.  Reviewed symptomatic care as well as ABX in this case.    Allergies, change to allegra, refill flonase  Orders Today:  No orders of the defined types were placed in this encounter.    Updated Medication List: (Includes new medications, updates to list, dose adjustments) Meds ordered this encounter  Medications  . Biotin 10 MG TABS    Sig: Take by mouth.  . fluticasone (FLONASE) 50 MCG/ACT nasal spray    Sig: Place 2 sprays into the nose daily.    Dispense:  48 g    Refill:  3  . amoxicillin (AMOXIL) 500 MG capsule    Sig: Take 2 capsules (1,000 mg total) by mouth 2 (two) times daily.    Dispense:  40 capsule    Refill:  0  . fluconazole (DIFLUCAN) 150 MG tablet    Sig: Take 1 tablet (150 mg total) by mouth once.    Dispense:  1 tablet    Refill:  0    Medications Discontinued: Medications Discontinued During This Encounter  Medication Reason  . olopatadine (PATANOL) 0.1 % ophthalmic solution Error     Signed, Kamala Kolton T. Camora Tremain, MD 12/14/2012 4:01 PM

## 2012-12-16 ENCOUNTER — Other Ambulatory Visit: Payer: Self-pay | Admitting: *Deleted

## 2012-12-16 MED ORDER — FLUTICASONE PROPIONATE 50 MCG/ACT NA SUSP: 2.0000 | Freq: Every day | NASAL | Status: DC

## 2012-12-16 NOTE — Telephone Encounter (Signed)
rx sent to right pharmacy

## 2012-12-16 NOTE — Telephone Encounter (Signed)
[facility] from [facility] that pt is no longer receives services from their pharmacy. I called pt to find out which pharmacy to send med to, but she wasn't aware who the new pharmacy provider was. She will call back with that info in order to have Flonase rx resent. She also stated that she had never been informed of her mammogram results. She says she was told by imaging center that we could notify her. I advised pt of her results. I will forward this not to Heather and Rena to send rx when pt returns call.

## 2012-12-20 ENCOUNTER — Ambulatory Visit: Payer: 59 | Admitting: Psychology

## 2012-12-27 ENCOUNTER — Ambulatory Visit: Payer: 59 | Admitting: Psychology

## 2013-01-02 ENCOUNTER — Ambulatory Visit (INDEPENDENT_AMBULATORY_CARE_PROVIDER_SITE_OTHER): Payer: 59 | Admitting: Family Medicine

## 2013-01-02 ENCOUNTER — Encounter: Payer: Self-pay | Admitting: Family Medicine

## 2013-01-02 VITALS — BP 120/84 | HR 65 | Temp 98.8°F | Ht 65.0 in | Wt 178.5 lb

## 2013-01-02 DIAGNOSIS — R6889 Other general symptoms and signs: Secondary | ICD-10-CM | POA: Insufficient documentation

## 2013-01-02 DIAGNOSIS — R109 Unspecified abdominal pain: Secondary | ICD-10-CM | POA: Insufficient documentation

## 2013-01-02 DIAGNOSIS — Z202 Contact with and (suspected) exposure to infections with a predominantly sexual mode of transmission: Secondary | ICD-10-CM

## 2013-01-02 DIAGNOSIS — Z2089 Contact with and (suspected) exposure to other communicable diseases: Secondary | ICD-10-CM

## 2013-01-02 NOTE — Patient Instructions (Addendum)
Work on low fat, bland diet. Push fluids.  We will call with lab tests.  Start probiotic like lactobaccilli OTC daily.   Follow up in 1 week or call sooner if symptoms change.

## 2013-01-02 NOTE — Progress Notes (Signed)
  Subjective:    Patient ID: Lacey Decker, female    DOB: [birth year], 42 y.o.   MRN: [id number]  Abdominal Pain Pertinent negatives include no fever.   42 year old female with 4-5 days of right upper and lower quadrant pain. Sudden onset,sharp cramping all over lasted 4-5 hours. Took a klonopin. Later next day moved to right side of abdomen, more mild. Pain has been constant since.  Minimal nausea., no vomiting or bloating. Stomach has been " grumbly"  No clear relation to food.  Has lost 4 lbs in last week due to   No dysuria, no blood in urine or stool.  No fever.  Finished amox for sinius infection the day prior to symtpoms.   S/P hysterectomy.  Issues with constipation in past... BMs every few days. Tried a laxative x 2 ..had BM , no change.  After second time watery stool  Hairdresser noted hair broken at roots... Recommended her to see the dcotor.  fatigue,  Increased stress daughter passed ut and was ill last week. She : freaked out about this" son with new vision issues.  Review of Systems  Constitutional: Positive for fatigue. Negative for fever.  HENT: Negative for congestion.   Gastrointestinal: Positive for abdominal pain.       Objective:   Physical Exam  Constitutional: Vital signs are normal. She appears well-developed and well-nourished. She is cooperative.  Non-toxic appearance. She does not appear ill. No distress.  HENT:  Head: Normocephalic.  Right Ear: Hearing, tympanic membrane, external ear and ear canal normal. Tympanic membrane is not erythematous, not retracted and not bulging.  Left Ear: Hearing, tympanic membrane, external ear and ear canal normal. Tympanic membrane is not erythematous, not retracted and not bulging.  Nose: No mucosal edema or rhinorrhea. Right sinus exhibits no maxillary sinus tenderness and no frontal sinus tenderness. Left sinus exhibits no maxillary sinus tenderness and no frontal sinus tenderness.  Mouth/Throat: Uvula is  midline, oropharynx is clear and moist and mucous membranes are normal.  Eyes: Conjunctivae, EOM and lids are normal. Pupils are equal, round, and reactive to light. No foreign bodies found.  Neck: Trachea normal and normal range of motion. Neck supple. Carotid bruit is not present. No mass and no thyromegaly present.  Cardiovascular: Normal rate, regular rhythm, S1 normal, S2 normal, normal heart sounds, intact distal pulses and normal pulses.  Exam reveals no gallop and no friction rub.   No murmur heard. Pulmonary/Chest: Effort normal and breath sounds normal. Not tachypneic. No respiratory distress. She has no decreased breath sounds. She has no wheezes. She has no rhonchi. She has no rales.  Abdominal: Soft. Normal appearance and bowel sounds are normal. There is no tenderness.  Neurological: She is alert.  Skin: Skin is warm, dry and intact. No rash noted.  Psychiatric: Her speech is normal and behavior is normal. Judgment and thought content normal. Her mood appears not anxious. Cognition and memory are normal. She does not exhibit a depressed mood.          Assessment & Plan:

## 2013-01-03 LAB — COMPREHENSIVE METABOLIC PANEL
Albumin: 4.1 g/dL (ref 3.5–5.5)
Alkaline Phosphatase: 42 IU/L (ref 39–117)
BUN/Creatinine Ratio: 24 — ABNORMAL HIGH (ref 9–23)
BUN: 18 mg/dL (ref 6–24)
CO2: 23 mmol/L (ref 19–28)
Creatinine, Ser: 0.76 mg/dL (ref 0.57–1.00)
GFR calc Af Amer: 110 mL/min/{1.73_m2} (ref >59)
Globulin, Total: 2.3 g/dL (ref 1.5–4.5)
Total Bilirubin: 0.2 mg/dL (ref 0.0–1.2)

## 2013-01-03 LAB — LIPASE: Lipase: 31 U/L (ref 0–59)

## 2013-01-03 LAB — RPR: RPR: NONREACTIVE

## 2013-01-08 NOTE — Assessment & Plan Note (Signed)
Will eval with TSH.

## 2013-01-08 NOTE — Assessment & Plan Note (Addendum)
?   IBS? Work on low fat, bland diet. Push fluids.  We will eval with labs including CMET, TSH, etc.  Start probiotic like lactobaccilli OTC daily.   Follow up in 1 week or call sooner if symptoms change.

## 2013-01-17 ENCOUNTER — Ambulatory Visit: Payer: Self-pay | Admitting: Psychology

## 2013-06-23 ENCOUNTER — Other Ambulatory Visit: Payer: Self-pay | Admitting: Family Medicine

## 2013-06-30 ENCOUNTER — Ambulatory Visit (INDEPENDENT_AMBULATORY_CARE_PROVIDER_SITE_OTHER): Payer: 59 | Admitting: Family Medicine

## 2013-06-30 ENCOUNTER — Encounter: Payer: Self-pay | Admitting: Family Medicine

## 2013-06-30 VITALS — BP 108/62 | HR 68 | Temp 97.9°F | Ht 65.0 in | Wt 179.0 lb

## 2013-06-30 DIAGNOSIS — N760 Acute vaginitis: Secondary | ICD-10-CM | POA: Insufficient documentation

## 2013-06-30 DIAGNOSIS — G43019 Migraine without aura, intractable, without status migrainosus: Secondary | ICD-10-CM

## 2013-06-30 MED ORDER — KETOROLAC TROMETHAMINE 60 MG/2ML IM SOLN
60.0000 mg | Freq: Once | INTRAMUSCULAR | Status: AC
  Administered 2013-06-30: 60 mg via INTRAMUSCULAR

## 2013-06-30 MED ORDER — METRONIDAZOLE 500 MG PO TABS: 500.0000 mg | ORAL_TABLET | Freq: Two times a day (BID) | ORAL | Status: DC

## 2013-06-30 NOTE — Patient Instructions (Addendum)
Change to hypo allergenic detergent.  Use warm water and no soap vaginally.  Treat BV with  Oral metronidazole. Use flexeril on shoulder and neck. Rest, fluids for migraine.  Call if not improving or symtpoms changing.

## 2013-06-30 NOTE — Progress Notes (Signed)
  Subjective:    Patient ID: Lacey Decker, female    DOB: 1971-08-09, 42 y.o.   MRN: [id number]  HPI   42 year old female with history of recurrent sinus infection, migraine presents with 3 days of right side of face pain, right ear radiates to her shoulder.   if she presses on shoulder her nose opens up, if she presses on sinus she can feel it in shoulder. Some runny nose, no sneeze, no itchy eyes. Subjective fever last night. No nausea.. She tried flexeril on the shoulder, ibuprofen did not help. No sensitivity to light and sound, no nausea or vomiting.  She has chronic anxiety and is a hypochondriac.   Seen at [facility] 2 weeks ago for bacterial vaginitis: metronidazole gel. Given fluconazole x 2 for yeast infection she usually gets after. Now she is still having vaginal odor and burning vaginally.    Review of Systems  Constitutional: Negative for fever and fatigue.  HENT: Positive for ear pain. Negative for tinnitus.   Eyes: Negative for pain.  Respiratory: Negative for chest tightness and shortness of breath.   Cardiovascular: Negative for chest pain, palpitations and leg swelling.  Gastrointestinal: Negative for abdominal pain.  Genitourinary: Negative for dysuria.       Objective:   Physical Exam  Constitutional: Vital signs are normal. She appears well-developed and well-nourished. She is cooperative.  Non-toxic appearance. She does not appear ill. No distress.  HENT:  Head: Normocephalic.  Right Ear: Hearing, tympanic membrane, external ear and ear canal normal. Tympanic membrane is not erythematous, not retracted and not bulging.  Left Ear: Hearing, tympanic membrane, external ear and ear canal normal. Tympanic membrane is not erythematous, not retracted and not bulging.  Nose: Mucosal edema and rhinorrhea present. Right sinus exhibits no maxillary sinus tenderness and no frontal sinus tenderness. Left sinus exhibits no maxillary sinus tenderness and no frontal  sinus tenderness.  Mouth/Throat: Uvula is midline, oropharynx is clear and moist and mucous membranes are normal.  Eyes: Conjunctivae, EOM and lids are normal. Pupils are equal, round, and reactive to light. Lids are everted and swept, no foreign bodies found.  Neck: Trachea normal and normal range of motion. Neck supple. Carotid bruit is not present. No mass and no thyromegaly present.  Cardiovascular: Normal rate, regular rhythm, S1 normal, S2 normal, normal heart sounds, intact distal pulses and normal pulses.  Exam reveals no gallop and no friction rub.   No murmur heard. Pulmonary/Chest: Effort normal and breath sounds normal. Not tachypneic. No respiratory distress. She has no decreased breath sounds. She has no wheezes. She has no rhonchi. She has no rales.  Genitourinary: No labial fusion. There is no rash, tenderness, lesion or injury on the right labia. There is no rash, tenderness or injury on the left labia. No erythema, tenderness or bleeding around the vagina. No foreign body around the vagina. No signs of injury around the vagina. Vaginal discharge found.  Neurological: She is alert.  Skin: Skin is warm, dry and intact. No rash noted.  Psychiatric: Her speech is normal and behavior is normal. Judgment normal. Her mood appears not anxious. Cognition and memory are normal. She does not exhibit a depressed mood.          Assessment & Plan:

## 2013-06-30 NOTE — Assessment & Plan Note (Addendum)
Symptoms are move consistent with with migraine then sinus infection.  Use flexeril on shoulder and neck. Rest, fluids for migraine.  Call if not improving or symtpoms changing.

## 2013-07-01 ENCOUNTER — Other Ambulatory Visit: Payer: Self-pay | Admitting: Family Medicine

## 2013-07-03 ENCOUNTER — Telehealth: Payer: Self-pay | Admitting: Family Medicine

## 2013-07-03 NOTE — Telephone Encounter (Signed)
[facility] entered wrong DOB on pt.

## 2013-07-03 NOTE — Telephone Encounter (Signed)
[facility] Triage Call Report Triage Record Num: [id number] Operator: Donnella Sham Patient Name: Lacey Decker Call Date & Time: 07/01/2013 3:28:16PM Patient Phone: PCP: [name] Patient Gender: Female PCP Fax : [phone] Patient DOB: 01/06/1971 Practice Name[facility] Reason for Call: Caller: [name]/Patient; PCP: [name] (Family Practice); CB#: [phone]; Call regarding Migraine, states hurts down to her neck and shoulders; onset 06/27/13; Calling to see if a muscle relaxer could be called in; seen in office 8/22 for sxs and diagnosed with a migraine; has used Flexal cream; feels like the issues are muscle related; All emergent sxs of Muscle Pain protocol r/o; home care advice given; rx called in to [facility] [phone] for Flexeril 5mg  PO TID prn, disp 9; advised calling office 8/25 for further instructions/appt Protocol(s) Used: Muscle Pain Recommended Outcome per Protocol: Provide Home/Self Care Reason for Outcome: All other situations Care Advice: Apply a cloth-covered cold or ice pack to the area for 20 minutes 4 to 8 times a day for relief of pain for the first 24-48 hours. After 24 to 48 hours of cold application, use a cloth-covered heat pack to the area for 20 minutes 3 to 4 times a day. ~ ~ SYMPTOM / CONDITION MANAGEMENT Analgesic/Antipyretic Advice - Acetaminophen: Consider acetaminophen as directed on label or by pharmacist/provider for pain or fever PRECAUTIONS: - Use if there is no history of liver disease, alcoholism, or intake of three or more alcohol drinks per day - Only if approved by provider during pregnancy or when breastfeeding - During pregnancy, acetaminophen should not be taken more than 3 consecutive days without telling provider - Do not exceed recommended dose or frequency ~ 07/01/2013 3:51:54PM Page 1 of 1 [email or web address]_[email or web address]_V2

## 2013-07-04 ENCOUNTER — Other Ambulatory Visit: Payer: Self-pay | Admitting: Family Medicine

## 2013-07-04 NOTE — Telephone Encounter (Signed)
Received refill request electronically. Medication is not on med sheet. Last office visit 06/30/13. Is it okay to refill medication?

## 2013-07-04 NOTE — Telephone Encounter (Signed)
Make sure pt knows that she should not take this at same time as flexeril...then okay to refill as requested.

## 2013-07-05 MED ORDER — CYCLOBENZAPRINE HCL 10 MG PO TABS: 10.0000 mg | ORAL_TABLET | Freq: Every evening | ORAL | Status: DC | PRN

## 2013-07-05 NOTE — Telephone Encounter (Signed)
Sent in rx for flexeril.

## 2013-07-05 NOTE — Telephone Encounter (Signed)
Patient states she would prefer to be on Flexeril.  Baclofen does not work and she was still having a lot of pain and called the MD on call over the weekend and they gave her a couple of flexeril which she took and tolerated.  Asking for prescription for flexeril instead of Baclofen.   Please advise.

## 2013-07-06 NOTE — Telephone Encounter (Signed)
Patient notified Flexeril has been sent to her pharmacy.

## 2013-07-16 LAB — POCT WET PREP WITH KOH: KOH Prep POC: NEGATIVE

## 2013-07-16 NOTE — Addendum Note (Signed)
Addended by: [name] E on: 07/16/2013 11:41 PM   Modules accepted: Orders

## 2013-07-16 NOTE — Assessment & Plan Note (Signed)
BV plus irritant vaginitis Change to hypo allergenic detergent.  Use warm water and no soap vaginally.  Treat BV with  oral metronidazole.

## 2013-07-17 ENCOUNTER — Ambulatory Visit (INDEPENDENT_AMBULATORY_CARE_PROVIDER_SITE_OTHER): Payer: 59 | Admitting: Internal Medicine

## 2013-07-17 ENCOUNTER — Encounter: Payer: Self-pay | Admitting: Internal Medicine

## 2013-07-17 VITALS — BP 120/80 | HR 70 | Wt 179.0 lb

## 2013-07-17 DIAGNOSIS — M25519 Pain in unspecified shoulder: Secondary | ICD-10-CM

## 2013-07-17 DIAGNOSIS — M7700 Medial epicondylitis, unspecified elbow: Secondary | ICD-10-CM

## 2013-07-17 DIAGNOSIS — M542 Cervicalgia: Secondary | ICD-10-CM | POA: Insufficient documentation

## 2013-07-17 DIAGNOSIS — M25511 Pain in right shoulder: Secondary | ICD-10-CM

## 2013-07-17 DIAGNOSIS — M7702 Medial epicondylitis, left elbow: Secondary | ICD-10-CM | POA: Insufficient documentation

## 2013-07-17 NOTE — Progress Notes (Signed)
Subjective:    Patient ID: Lacey Decker, female    DOB: [birth year], 42 y.o.   MRN: [id number]  HPI Having pain still Was in recently with migraine--but also tightness and pain in left shoulder and traps Got shot in office and then flexeril Head pain is better--just some pressure  Now pain is into arm from the shoulder and neck Even feels it in her hand--trouble using mouse at work Not sure if this could be related to past elbow dislocation/fracture (20 years ago)  Using icy hot/ben gay/ibuprofen/tramadol No clear response to this  Current Outpatient Prescriptions on File Prior to Visit  Medication Sig Dispense Refill  . cyclobenzaprine (FLEXERIL) 10 MG tablet Take 1 tablet (10 mg total) by mouth at bedtime as needed for muscle spasms.  30 tablet  0  . fexofenadine (ALLEGRA) 180 MG tablet Take 180 mg by mouth daily.      . fluticasone (FLONASE) 50 MCG/ACT nasal spray Place 2 sprays into the nose daily.  48 g  3  . omeprazole (PRILOSEC) 40 MG capsule Take 40 mg by mouth daily.       No current facility-administered medications on file prior to visit.    Allergies  Allergen Reactions  . Clarithromycin     REACTION: Trouble breathing - stomach upset - bad taste in mouth  . Codeine     REACTION: anti phylactic shock  . Codeine Phosphate     REACTION: unspecified  . Erythromycin Base Other (See Comments)    Eye ointment - worsened conjunctivitis and swelling  . Morphine     REACTION: swelling  . Morphine Sulfate     REACTION: unspecified  . Sulfamethoxazole W-Trimethoprim     REACTION: N \\T \ V, rash    Past Medical History  Diagnosis Date  . Chlamydia     as a teen  . Anxiety   . Bipolar 1 disorder   . MRSA (methicillin resistant staph aureus) culture positive   . Alcohol abuse     last drink one year ago    Past Surgical History  Procedure Laterality Date  . Btl  11/09/1998  . Leep  2000  . Dilation and curettage of uterus  1997    after miscarriage  .  Misscarriage      x5  . Nsvd      3  . Neuroma surgery      lipoma removal Right labia    Family History  Problem Relation Age of Onset  . Hypertension Mother   . Hyperlipidemia Mother   . Depression Mother   . Bipolar disorder Sister   . Depression Maternal Grandmother     History   Social History  . Marital Status: Legally Separated    Spouse Name: N/A    Number of Children: 3  . Years of Education: N/A   Occupational History  .  Lab [employer]   Social History Main Topics  . Smoking status: Never Smoker   . Smokeless tobacco: Never Used  . Alcohol Use: No  . Drug Use: No  . Sexual Activity: Not on file   Other Topics Concern  . Not on file   Social History Narrative  . No narrative on file   Review of Systems Relates neck and shoulder tightness chronically as manifestation of anxiety problems No weakness    Objective:   Physical Exam  Constitutional: She appears well-developed and well-nourished. No distress.  Neck: Normal range of motion. Neck supple.  Fairly normal  ROM but slight sound with full rotation Tightness along trapezii  Musculoskeletal:  Mild crepitus in right shoulder but normal ROM without pain  Clear tenderness in medial left elbow (tendon)          Assessment & Plan:

## 2013-07-17 NOTE — Assessment & Plan Note (Signed)
Seems to be mostly muscular there Slight crepitus but doesn't seem to be a major component here Will refer for PT Continue the ibuprofen but increase to antiinflammatory doses

## 2013-07-17 NOTE — Patient Instructions (Signed)
Please try heat on your shoulder, but ice on the inside of the elbow Increase the ibuprofen to 600mg  (3 tabs) three times a day with meals

## 2013-07-17 NOTE — Assessment & Plan Note (Signed)
Try ice on this Ibuprofen PT

## 2013-07-20 ENCOUNTER — Encounter: Payer: Self-pay | Admitting: Internal Medicine

## 2013-08-09 ENCOUNTER — Encounter: Payer: Self-pay | Admitting: Internal Medicine

## 2013-08-22 ENCOUNTER — Encounter: Payer: Self-pay | Admitting: Family Medicine

## 2013-08-22 ENCOUNTER — Ambulatory Visit (INDEPENDENT_AMBULATORY_CARE_PROVIDER_SITE_OTHER): Payer: 59 | Admitting: Family Medicine

## 2013-08-22 VITALS — BP 110/70 | HR 73 | Temp 98.5°F | Ht 65.0 in | Wt 182.2 lb

## 2013-08-22 DIAGNOSIS — R232 Flushing: Secondary | ICD-10-CM

## 2013-08-22 DIAGNOSIS — R5381 Other malaise: Secondary | ICD-10-CM

## 2013-08-22 DIAGNOSIS — N951 Menopausal and female climacteric states: Secondary | ICD-10-CM

## 2013-08-22 DIAGNOSIS — R5383 Other fatigue: Secondary | ICD-10-CM

## 2013-08-22 NOTE — Progress Notes (Signed)
  Subjective:    Patient ID: Lacey Decker, female    DOB: 1971-01-25, 42 y.o.   MRN: [id number]  HPI 42 year old female presents with  Alopecia ( thinning all over), hot flashes, fatigue.  She has had nml TSH in 12/2012.  She has also been more hungry.  Mainly hot flashes at night.  ( not associated with panic attacks)  Wt Readings from Last 3 Encounters:  08/22/13 182 lb 4 oz (82.668 kg)  07/17/13 179 lb (81.194 kg)  06/30/13 179 lb (81.194 kg)   S/P hysterectomy.. Has one ovary.    Mother ? menopause   Review of Systems  Constitutional: Negative for fever and fatigue.  HENT: Negative for ear pain.   Eyes: Negative for pain.  Respiratory: Negative for chest tightness and shortness of breath.   Cardiovascular: Negative for chest pain, palpitations and leg swelling.  Gastrointestinal: Negative for abdominal pain.  Genitourinary: Negative for dysuria.       Objective:   Physical Exam  Constitutional: Vital signs are normal. She appears well-developed and well-nourished. She is cooperative.  Non-toxic appearance. She does not appear ill. No distress.  HENT:  Head: Normocephalic.  Right Ear: Hearing, tympanic membrane, external ear and ear canal normal. Tympanic membrane is not erythematous, not retracted and not bulging.  Left Ear: Hearing, tympanic membrane, external ear and ear canal normal. Tympanic membrane is not erythematous, not retracted and not bulging.  Nose: No mucosal edema or rhinorrhea. Right sinus exhibits no maxillary sinus tenderness and no frontal sinus tenderness. Left sinus exhibits no maxillary sinus tenderness and no frontal sinus tenderness.  Mouth/Throat: Uvula is midline, oropharynx is clear and moist and mucous membranes are normal.  Eyes: Conjunctivae, EOM and lids are normal. Pupils are equal, round, and reactive to light. Lids are everted and swept, no foreign bodies found.  Neck: Trachea normal and normal range of motion. Neck supple. Carotid  bruit is not present. No mass and no thyromegaly present.  Cardiovascular: Normal rate, regular rhythm, S1 normal, S2 normal, normal heart sounds, intact distal pulses and normal pulses.  Exam reveals no gallop and no friction rub.   No murmur heard. Pulmonary/Chest: Effort normal and breath sounds normal. Not tachypneic. No respiratory distress. She has no decreased breath sounds. She has no wheezes. She has no rhonchi. She has no rales.  Abdominal: Soft. Normal appearance and bowel sounds are normal. There is no tenderness.  Neurological: She is alert.  Skin: Skin is warm, dry and intact. No rash noted.  Psychiatric: Her speech is normal and behavior is normal. Judgment and thought content normal. Her mood appears not anxious. Cognition and memory are normal. She does not exhibit a depressed mood.          Assessment & Plan:  Fatigue and alopecia: Eval with labs.  ? Menopausal symtpoms causing hot flashes: Eval TSH and hormones.  Recommended decreasing stress, work on healthy lifestyle , weight loss and adequate sleep.  Daily multivitamine.

## 2013-08-22 NOTE — Patient Instructions (Signed)
We will call with lab results   

## 2013-08-22 NOTE — Addendum Note (Signed)
Addended by: Alvina Chou on: 08/22/2013 05:19 PM   Modules accepted: Orders

## 2013-08-24 LAB — LUTEINIZING HORMONE: LH: 6.9 m[IU]/mL

## 2013-08-24 LAB — CBC WITH DIFFERENTIAL
Basos: 0 %
Eos: 3 %
Hemoglobin: 13.1 g/dL (ref 11.1–15.9)
Immature Grans (Abs): 0 10*3/uL (ref 0.0–0.1)
Immature Granulocytes: 0 %
Lymphocytes Absolute: 2.2 10*3/uL (ref 0.7–3.1)
MCH: 32.7 pg (ref 26.6–33.0)
MCV: 96 fL (ref 79–97)
Monocytes Absolute: 0.5 10*3/uL (ref 0.1–0.9)
Monocytes: 6 %
Platelets: 334 10*3/uL (ref 150–379)
RBC: 4.01 x10E6/uL (ref 3.77–5.28)
RDW: 13.2 % (ref 12.3–15.4)
WBC: 7.2 10*3/uL (ref 3.4–10.8)

## 2013-08-24 LAB — COMPREHENSIVE METABOLIC PANEL
Albumin/Globulin Ratio: 2.1 (ref 1.1–2.5)
Albumin: 4.2 g/dL (ref 3.5–5.5)
BUN: 14 mg/dL (ref 6–24)
CO2: 25 mmol/L (ref 18–29)
Calcium: 8.9 mg/dL (ref 8.7–10.2)
Creatinine, Ser: 0.75 mg/dL (ref 0.57–1.00)
GFR calc non Af Amer: 97 mL/min/{1.73_m2} (ref >59)
Globulin, Total: 2 g/dL (ref 1.5–4.5)
Potassium: 4.2 mmol/L (ref 3.5–5.2)

## 2013-08-24 LAB — VITAMIN D 25 HYDROXY (VIT D DEFICIENCY, FRACTURES): Vit D, 25-Hydroxy: 30.4 ng/mL (ref 30.0–100.0)

## 2013-08-24 LAB — TSH: TSH: 0.821 u[IU]/mL (ref 0.450–4.500)

## 2013-08-24 LAB — FOLLICLE STIMULATING HORMONE: FSH: 32.2 m[IU]/mL

## 2013-08-25 ENCOUNTER — Encounter: Payer: Self-pay | Admitting: *Deleted

## 2013-09-09 ENCOUNTER — Encounter: Payer: Self-pay | Admitting: Internal Medicine

## 2013-09-14 ENCOUNTER — Encounter (HOSPITAL_COMMUNITY): Payer: Self-pay | Admitting: Emergency Medicine

## 2013-09-14 ENCOUNTER — Telehealth: Payer: Self-pay | Admitting: Family Medicine

## 2013-09-14 ENCOUNTER — Emergency Department (HOSPITAL_COMMUNITY)
Admission: EM | Admit: 2013-09-14 | Discharge: 2013-09-14 | Disposition: A | Discharge status: Home / Self Care | Payer: 59 | Attending: Emergency Medicine | Admitting: Emergency Medicine

## 2013-09-14 DIAGNOSIS — Z79899 Other long term (current) drug therapy: Secondary | ICD-10-CM | POA: Insufficient documentation

## 2013-09-14 DIAGNOSIS — K219 Gastro-esophageal reflux disease without esophagitis: Secondary | ICD-10-CM

## 2013-09-14 DIAGNOSIS — Z8614 Personal history of Methicillin resistant Staphylococcus aureus infection: Secondary | ICD-10-CM | POA: Insufficient documentation

## 2013-09-14 DIAGNOSIS — F411 Generalized anxiety disorder: Secondary | ICD-10-CM | POA: Insufficient documentation

## 2013-09-14 DIAGNOSIS — Z8619 Personal history of other infectious and parasitic diseases: Secondary | ICD-10-CM | POA: Insufficient documentation

## 2013-09-14 LAB — CBC
Hemoglobin: 13 g/dL (ref 12.0–15.0)
Platelets: 285 10*3/uL (ref 150–400)
RBC: 3.88 MIL/uL (ref 3.87–5.11)

## 2013-09-14 MED ORDER — GI COCKTAIL ~~LOC~~
30.0000 mL | Freq: Once | ORAL | Status: AC
  Administered 2013-09-14: 30 mL via ORAL
  Filled 2013-09-14: qty 30

## 2013-09-14 NOTE — ED Provider Notes (Signed)
Pt with hx of severe GERD and erosive esophagitis.  States that she had a choking spell at home, felt like she couldn't brief which lasted a short time followed by an episode of vomiting which appeared to contain blood. This was an isolated episode, she has not had this in the past, she does state that she had been eating some ketchup earlier in the evening with her meal as well as blackberries just prior to going to sleep. On exam she has a soft abdomen, clear heart and lung sounds are clear oropharynx without evidence of any bleeding. Her CBC is normal showing no signs of anemia, she can be referred to gastroenterology. I have cautioned her and given her some education on avoiding NSAIDs and she does seem to take a lot of ibuprofen. I have also asked her to followup with gastroenterology for her erosive esophagitis and she may develop peptic ulcer disease or have complications of her esophagitis. At this time she appears stable for discharge, will take antihistamine i.e. Pepcid or Zantac and Prilosec as well. Patient agrees and is amenable to discharge  Medical screening examination/treatment/procedure(s) were conducted as a shared visit with non-physician practitioner(s) and myself.  I personally evaluated the patient during the encounter.       Vida Roller, MD 09/14/13 [phone]

## 2013-09-14 NOTE — ED Notes (Addendum)
Pt alert, NAD, anxious, fast pressured speech, reports earlier hyperventilation, reports "woke at 0130 with gagging/gasping episode from stomach acid in throat", "was anxious and took a while to catch my breath", then I vomited x2, emesis looked like blood in the toilet bowl. Last ate burger & onion rings at 1900, had fruit snacks at 2200. Took prilosec, 3 antacids and a xanax PTA. Last BM ~2d ago. "have issues with constipation", scant diarrhea PTA "because my stomach was so torn up", denies pain, "just burning", "stomach acid is still in my throat". Denies sob, dizziness or nausea at this time.

## 2013-09-14 NOTE — ED Notes (Signed)
Pt remembers having blackberries before bed/ lying down. She now thinks this may have been why she thought she was bleeding.

## 2013-09-14 NOTE — Telephone Encounter (Signed)
[facility] Triage Call Report Triage Record Num: [id number] Operator: Rebeca Allegra Patient Name: Lacey Decker Call Date & Time: 09/14/2013 3:13:26AM Patient Phone: [phone] PCP: [name] Patient Gender: Female PCP Fax : [phone] Patient DOB: [birth year] [facility] Name[facility] MRN: [id number] Reason for Call: Caller: Lacey Decker/Patient; PCP: [name] (Family Practice); CB#: [phone]; Call regarding Vomiting; 09/14/13 0130 x1. Pt reports vomiting blood. Pt states she woke up trying to catch her breath because of reflux and then vomited. Emergent symptom of "Vomiting red, bloody or coffee-ground material, more than streaks of blood or scant amount". Advised pt to call 911. Pt declined 911 and plans to drive herself to the ER. Protocol(s) Used: Gastrointestinal Bleeding Recommended Outcome per Protocol: Activate EMS 911 Reason for Outcome: Vomiting red, bloody or coffee-ground material, more than streaks of blood or scant amount (not following nosebleed within past day) Care Advice: ~ 09/14/2013 3:19:01AM Page 1 of 1 [email or web address]_[email or web address]_[email or web address]

## 2013-09-14 NOTE — Telephone Encounter (Signed)
Agree with ER visit. 

## 2013-09-14 NOTE — ED Provider Notes (Signed)
CSN: [id number]     Arrival date & time 09/14/13  0340 History   First MD Initiated Contact with Patient 09/14/13 0345     Chief Complaint  Patient presents with  . GI Problem  . GI Bleeding  . Emesis  . Gastrophageal Reflux   (Consider location/radiation/quality/duration/timing/severity/associated sxs/prior Treatment) HPI Comments: The patient is a 42 year-old female with a past medical history of Anxiety, GERD, Esophageal eriosion, presenting the Emergency Department with a chief complaint of hematemesis.  The patient reports waking up and having to gasp 15 times before being able to get a "good breath" and had an episode of "pink vomit".  She reports she belched twice and then vomited in the toilet which was pink also.  She reports eating fries and ketchup earlier in the evening.  She reports a small loose stool just after vomiting. She denies fever or chills, hematuria, or dysuria.  Denies lightheadedness.    The history is provided by the patient.    Past Medical History  Diagnosis Date  . Chlamydia     as a teen  . Anxiety   . Bipolar 1 disorder   . MRSA (methicillin resistant staph aureus) culture positive   . Alcohol abuse     last drink one year ago   Past Surgical History  Procedure Laterality Date  . Btl  11/09/1998  . Leep  2000  . Dilation and curettage of uterus  1997    after miscarriage  . Misscarriage      x5  . Nsvd      3  . Neuroma surgery      lipoma removal Right labia   Family History  Problem Relation Age of Onset  . Hypertension Mother   . Hyperlipidemia Mother   . Depression Mother   . Bipolar disorder Sister   . Depression Maternal Grandmother    History  Substance Use Topics  . Smoking status: Never Smoker   . Smokeless tobacco: Never Used  . Alcohol Use: No   OB History   Grav Para Term Preterm Abortions TAB SAB Ect Mult Living                 Review of Systems  All other systems reviewed and are negative.    Allergies   Clarithromycin; Codeine; Codeine phosphate; Erythromycin base; Morphine; Morphine sulfate; and Sulfamethoxazole-trimethoprim  Home Medications   Current Outpatient Rx  Name  Route  Sig  Dispense  Refill  . cetirizine (ZYRTEC) 10 MG tablet   Oral   Take 10 mg by mouth daily.         Marland Kitchen omeprazole (PRILOSEC) 40 MG capsule   Oral   Take 40 mg by mouth daily.         Marland Kitchen OVER THE COUNTER MEDICATION   Oral   Take 30 g by mouth 2 (two) times daily. Protein powder and shakes from [employer]          BP 114/68  Pulse 80  Temp(Src) 97.4 F (36.3 C) (Oral)  Resp 18  SpO2 100% Physical Exam  Nursing note and vitals reviewed. Constitutional: She is oriented to person, place, and time. Vital signs are normal. She appears well-developed and well-nourished. No distress.  Appears anxious on exam.  Patient tearful on exam.  HENT:  Head: Normocephalic and atraumatic.  Mouth/Throat: Uvula is midline, oropharynx is clear and moist and mucous membranes are normal. Mucous membranes are not pale and not dry. No oropharyngeal exudate,  posterior oropharyngeal edema or posterior oropharyngeal erythema.  Neck: Neck supple.  Cardiovascular: Normal rate, regular rhythm and normal heart sounds.   Pulmonary/Chest: Effort normal and breath sounds normal. She has no wheezes. She has no rales.  Abdominal: Soft. Bowel sounds are normal. There is no tenderness. There is no rebound and no guarding.  Musculoskeletal: Normal range of motion. She exhibits no edema.  Neurological: She is alert and oriented to person, place, and time.  Skin: Skin is warm and dry. No pallor.    ED Course  Procedures (including critical care time) Labs Review Labs Reviewed  CBC   Imaging Review No results found.  EKG Interpretation   None       MDM   1. GERD (gastroesophageal reflux disease)    Patient with a history of GERD, presents with "pink emesis" x2.  PE without acute abdomin signs, patient is not pale  or have signs of anemia or acute blood loss.  Discussed patient condition with Dr.  Simonne Come and he agrees on checking a Hgb level. Gi cocktail ordered.  Upon further questioning patient reports eating blackberries just prior to going to bed this evening.  Hbg 13.0.  Discussed lab results and treatment plan with the patient.  She reports understanding and no other concerns at this time.    Patient is stable for discharge at this time.  Meds given in ED:  Medications  gi cocktail (Maalox,Lidocaine,Donnatal) (30 mLs Oral Given 09/14/13 0423)    Discharge Medication List as of 09/14/2013  5:09 AM           Leotis Shames Doretha Imus, PA-C 09/16/13 1556

## 2013-09-14 NOTE — ED Notes (Signed)
ED PA at [facility] 

## 2013-09-17 NOTE — ED Provider Notes (Signed)
Medical screening examination/treatment/procedure(s) were conducted as a shared visit with non-physician practitioner(s) and myself.  I personally evaluated the patient during the encounter  Please see my separate respective documentation pertaining to this patient encounter   Vida Roller, MD 09/17/13 [phone]

## 2013-10-25 ENCOUNTER — Telehealth: Payer: Self-pay | Admitting: *Deleted

## 2013-10-25 NOTE — Telephone Encounter (Signed)
Pt has endoscopy done today at [facility] and per pt "they found something" pt is asking that something be called in for her nerves, please advise, pt states they will send records of procedure.

## 2013-10-26 ENCOUNTER — Telehealth: Payer: Self-pay | Admitting: Family Medicine

## 2013-10-26 NOTE — Telephone Encounter (Signed)
Error

## 2013-10-26 NOTE — Telephone Encounter (Signed)
Appointment scheduled 10/27/2013 @ 10:30am.

## 2013-10-26 NOTE — Telephone Encounter (Signed)
Left message for Lacey Decker to call the office and schedule an appointment with Dr. Ermalene Searing before medication can be prescribed.

## 2013-10-26 NOTE — Telephone Encounter (Signed)
Needs appt to be seen for med adjustment/ initiation.

## 2013-10-27 ENCOUNTER — Ambulatory Visit (INDEPENDENT_AMBULATORY_CARE_PROVIDER_SITE_OTHER): Payer: 59 | Admitting: Family Medicine

## 2013-10-27 ENCOUNTER — Encounter: Payer: Self-pay | Admitting: Family Medicine

## 2013-10-27 VITALS — BP 118/80 | HR 80 | Temp 98.7°F | Ht 65.0 in | Wt 182.5 lb

## 2013-10-27 DIAGNOSIS — K219 Gastro-esophageal reflux disease without esophagitis: Secondary | ICD-10-CM

## 2013-10-27 DIAGNOSIS — R6889 Other general symptoms and signs: Secondary | ICD-10-CM

## 2013-10-27 DIAGNOSIS — F411 Generalized anxiety disorder: Secondary | ICD-10-CM

## 2013-10-27 DIAGNOSIS — R198 Other specified symptoms and signs involving the digestive system and abdomen: Secondary | ICD-10-CM

## 2013-10-27 MED ORDER — ALPRAZOLAM 0.5 MG PO TABS: 0.2500 mg | ORAL_TABLET | Freq: Three times a day (TID) | ORAL | Status: DC | PRN

## 2013-10-27 NOTE — Assessment & Plan Note (Signed)
Pending CT abd pelvis.Jeanene Erb GI MD to determine when this will be schedueld given pts level of anxiety in waiting.

## 2013-10-27 NOTE — Assessment & Plan Note (Signed)
Resolved on 2013 EGD

## 2013-10-27 NOTE — Progress Notes (Signed)
   Subjective:    Patient ID: Lacey Decker, female    DOB: 12-17-1970, 42 y.o.   MRN: [id number]  HPI  42 year old female well known to me with history of bipolar do and generalized anxiety, panic attacks and hypochondriasis  currently on no psychotropic meds presents with  worsened anxiety ever since 12/17 when she had an abnormal EGD.Marland Kitchen Esophagitis resolved, there was extrensic  Compression of gastric fundus... Needs CT to eval.  Also gastric polyp. Dr. Shelle Iron.   She is anxious that she has cancer of some other horrible issue and will die.  The CT has not been scheduled yet and this is making her anxious.   Review of Systems  Constitutional: Negative for fever and fatigue.  HENT: Negative for ear pain.   Eyes: Negative for pain.  Respiratory: Negative for chest tightness and shortness of breath.   Cardiovascular: Negative for chest pain, palpitations and leg swelling.  Gastrointestinal: Negative for abdominal pain.  Genitourinary: Negative for dysuria.       Objective:   Physical Exam  Constitutional: Vital signs are normal. She appears well-developed and well-nourished. She is cooperative.  Non-toxic appearance. She does not appear ill. No distress.  HENT:  Head: Normocephalic.  Right Ear: Hearing, tympanic membrane, external ear and ear canal normal. Tympanic membrane is not erythematous, not retracted and not bulging.  Left Ear: Hearing, tympanic membrane, external ear and ear canal normal. Tympanic membrane is not erythematous, not retracted and not bulging.  Nose: No mucosal edema or rhinorrhea. Right sinus exhibits no maxillary sinus tenderness and no frontal sinus tenderness. Left sinus exhibits no maxillary sinus tenderness and no frontal sinus tenderness.  Mouth/Throat: Uvula is midline, oropharynx is clear and moist and mucous membranes are normal.  Eyes: Conjunctivae, EOM and lids are normal. Pupils are equal, round, and reactive to light. Lids are everted and swept, no  foreign bodies found.  Neck: Trachea normal and normal range of motion. Neck supple. Carotid bruit is not present. No mass and no thyromegaly present.  Cardiovascular: Normal rate, regular rhythm, S1 normal, S2 normal, normal heart sounds, intact distal pulses and normal pulses.  Exam reveals no gallop and no friction rub.   No murmur heard. Pulmonary/Chest: Effort normal and breath sounds normal. Not tachypneic. No respiratory distress. She has no decreased breath sounds. She has no wheezes. She has no rhonchi. She has no rales.  Abdominal: Soft. Normal appearance and bowel sounds are normal. She exhibits no shifting dullness, no distension, no pulsatile liver, no fluid wave, no abdominal bruit, no ascites and no mass. There is no tenderness.  Neurological: She is alert.  Skin: Skin is warm, dry and intact. No rash noted.  Psychiatric: Her speech is normal. Thought content normal. Her mood appears anxious. Her affect is labile. She is agitated. Cognition and memory are normal. She expresses impulsivity and inappropriate judgment. She does not exhibit a depressed mood. She expresses no homicidal and no suicidal ideation. She expresses no suicidal plans and no homicidal plans.  tearful          Assessment & Plan:

## 2013-10-27 NOTE — Assessment & Plan Note (Signed)
Situational worsening due to recent EGD and upcoming CT.  Will treat with temporary alprazolam prn.

## 2013-10-27 NOTE — Progress Notes (Signed)
Pre-visit discussion using our clinic review tool. No additional management support is needed unless otherwise documented below in the visit note.  

## 2013-10-27 NOTE — Patient Instructions (Signed)
Use alprazolam prn. Follow up in 2 weeks if continued issues with anxiety.

## 2013-11-01 ENCOUNTER — Ambulatory Visit: Payer: Self-pay | Admitting: Gastroenterology

## 2013-11-16 ENCOUNTER — Other Ambulatory Visit: Payer: Self-pay | Admitting: Family Medicine

## 2013-11-16 NOTE — Telephone Encounter (Signed)
Last office visit 10/27/2013.  Ok to refill? 

## 2013-11-17 NOTE — Telephone Encounter (Signed)
Called to [facility]. 

## 2013-12-13 ENCOUNTER — Ambulatory Visit: Payer: Self-pay | Admitting: Internal Medicine

## 2014-04-18 ENCOUNTER — Encounter: Payer: Self-pay | Admitting: Family Medicine

## 2014-04-18 ENCOUNTER — Ambulatory Visit (INDEPENDENT_AMBULATORY_CARE_PROVIDER_SITE_OTHER): Payer: 59 | Admitting: Family Medicine

## 2014-04-18 VITALS — BP 110/78 | HR 64 | Temp 98.5°F | Ht 65.0 in | Wt 184.5 lb

## 2014-04-18 DIAGNOSIS — R000 Tachycardia, unspecified: Secondary | ICD-10-CM

## 2014-04-18 DIAGNOSIS — F410 Panic disorder [episodic paroxysmal anxiety] without agoraphobia: Secondary | ICD-10-CM

## 2014-04-18 DIAGNOSIS — F319 Bipolar disorder, unspecified: Secondary | ICD-10-CM

## 2014-04-18 DIAGNOSIS — F411 Generalized anxiety disorder: Secondary | ICD-10-CM

## 2014-04-18 MED ORDER — CLONAZEPAM 0.5 MG PO TABS: 0.2500 mg | ORAL_TABLET | Freq: Two times a day (BID) | ORAL | Status: DC | PRN

## 2014-04-18 NOTE — Patient Instructions (Signed)
1/2 tablet twice a day for the next 4-5 days.

## 2014-04-18 NOTE — Progress Notes (Signed)
[name] [address] Phone: [phone] Fax: [phone]  Patient ID: Lacey Decker MRN: [id number], DOB: June 12, 1971, 43 y.o. Date of Encounter: 04/18/2014  Primary Physician:  Eliezer Lofts, MD   Chief Complaint: Fatigue, Heart Fluttering and Breast Tenderness   Subjective:   History of Present Illness:  Lacey Decker is a 43 y.o. very pleasant female patient who presents with the following:  Has been in bed for three days, and has had a hyst and had some nipple pain - has been getting worse progressively.  Was having chest pain and tachycardia / palpitations.  Anxiety - feel like getting worse and worse and feels like some fluttering in her chest.   In the past was on Depakote, Lamictal. ? Others.  S/p 2 psychiatric hospitalizations. Ativan. Also intermittent Xanax.  None now.   Past Medical History, Surgical History, Social History, Family History, Problem List, Medications, and Allergies have been reviewed and updated if relevant.  Review of Systems:  GEN: No acute illnesses, no fevers, chills. GI: No n/v/d, eating normally Pulm: No SOB Interactive and getting along well at home.  Otherwise, ROS is as per the HPI.  Objective:   Physical Examination: BP 110/78  Pulse 64  Temp(Src) 98.5 F (36.9 C) (Oral)  Ht 5\' 5"  (1.651 m)  Wt 184 lb 8 oz (83.689 kg)  BMI 30.70 kg/m2   GEN: WDWN, NAD, Non-toxic, A & O x 3 HEENT: Atraumatic, Normocephalic. Neck supple. No masses, No LAD. Ears and Nose: No external deformity. CV: RRR, No M/G/R. No JVD. No thrill. No extra heart sounds. PULM: CTA B, no wheezes, crackles, rhonchi. No retractions. No resp. distress. No accessory muscle use. EXTR: No c/c/e NEURO Normal gait.  PSYCH: Normally interactive. Conversant. + anxious appearing.    Laboratory and Imaging Data:  Assessment & Plan:   Panic attack  Tachycardia - Plan: EKG 12-Lead  PANIC ATTACKS  Generalized anxiety disorder  DSORD  BIPOLAR I, UNSPC, MOST RECENT EPSD  Acute panic attacks with ongoing anxiety in patient with Bipolar I. Challenging. She would do better if she would take Lithium, Lamictal, Depakote or Tegretol, but I am not sure she will.   Klonopin 0.25 mg po bid for the next week acutely.  Inderall XR may be of benefit if she will not take mood stabilizers.  EKG: Normal sinus rhythm. Normal axis, normal R wave progression, No acute ST elevation or depression.   New Prescriptions   CLONAZEPAM (KLONOPIN) 0.5 MG TABLET    Take 0.5-1 tablets (0.25-0.5 mg total) by mouth 2 (two) times daily as needed for anxiety.   Modified Medications   No medications on file   Orders Placed This Encounter  Procedures  . EKG 12-Lead   Follow-up: No Follow-up on file. Unless noted above, the patient is to follow-up if symptoms worsen. Red flags were reviewed with the patient.  Signed,  Maud Deed. Emorie Mcfate, MD, [facility]   Discontinued Medications   ALPRAZOLAM (XANAX) 0.5 MG TABLET    TAKE 1/2 TABLET BY MOUTH 3 TIMES DAILY AS NEEDED FOR ANXIETY   OVER THE COUNTER MEDICATION    Take 30 g by mouth 2 (two) times daily. Protein powder and shakes from [employer]   Current Medications at Discharge:   Medication List       This list is accurate as of: 04/18/14  5:50 PM.  Always use your most recent med list.  cetirizine 10 MG tablet  Commonly known as:  ZYRTEC  Take 10 mg by mouth daily.     clonazePAM 0.5 MG tablet  Commonly known as:  KLONOPIN  Take 0.5-1 tablets (0.25-0.5 mg total) by mouth 2 (two) times daily as needed for anxiety.     esomeprazole 40 MG capsule  Commonly known as:  NEXIUM  Take 40 mg by mouth daily at 12 noon.

## 2014-04-18 NOTE — Progress Notes (Signed)
Pre visit review using our clinic review tool, if applicable. No additional management support is needed unless otherwise documented below in the visit note. 

## 2014-06-28 ENCOUNTER — Ambulatory Visit (INDEPENDENT_AMBULATORY_CARE_PROVIDER_SITE_OTHER): Payer: 59 | Admitting: Family Medicine

## 2014-06-28 ENCOUNTER — Encounter: Payer: Self-pay | Admitting: Family Medicine

## 2014-06-28 ENCOUNTER — Ambulatory Visit: Payer: Self-pay | Admitting: Family Medicine

## 2014-06-28 VITALS — BP 122/76 | HR 65 | Temp 98.1°F | Wt 183.5 lb

## 2014-06-28 DIAGNOSIS — J018 Other acute sinusitis: Secondary | ICD-10-CM

## 2014-06-28 DIAGNOSIS — J019 Acute sinusitis, unspecified: Secondary | ICD-10-CM | POA: Insufficient documentation

## 2014-06-28 DIAGNOSIS — F410 Panic disorder [episodic paroxysmal anxiety] without agoraphobia: Secondary | ICD-10-CM

## 2014-06-28 MED ORDER — FLUCONAZOLE 150 MG PO TABS: 150.0000 mg | ORAL_TABLET | Freq: Once | ORAL | Status: AC

## 2014-06-28 MED ORDER — AMOXICILLIN 875 MG PO TABS: 875.0000 mg | ORAL_TABLET | Freq: Two times a day (BID) | ORAL | Status: DC

## 2014-06-28 NOTE — Progress Notes (Signed)
Pre visit review using our clinic review tool, if applicable. No additional management support is needed unless otherwise documented below in the visit note. 

## 2014-06-28 NOTE — Assessment & Plan Note (Signed)
New- Given duration and progression of symptoms, will treat for bacterial sinusitis.  Multiple abx allergies. Amoxicillin twice daily x 10 days, restart flonase. Call or return to clinic prn if these symptoms worsen or fail to improve as anticipated. The patient indicates understanding of these issues and agrees with the plan.

## 2014-06-28 NOTE — Patient Instructions (Signed)
Good to see you. Please restart flonase. Take Amoxicillin as directed- 1 tablet twice daily for 10 days. Call us if not better in 5-7 days.

## 2014-06-28 NOTE — Progress Notes (Signed)
Subjective:   Patient ID: Lacey Decker, female    DOB: November 10, 1970, 43 y.o.   MRN: [id number]  Lacey Decker is a pleasant 43 y.o. year old female pt of Dr .Diona Browner with h/o significant anxiety who presents to clinic today with Facial Pain and Headache  on 06/28/2014  HPI: Has been having a headache (frontal) for past week- she thought it was due to increased stressors at home.  Has been having more panic attacks.  Feels less anxious today.  Yesterday, developed facial and tooth pain.  No fever.  No CP or SOB.  She does have flonase at home but has not been taking it.  No current outpatient prescriptions on file prior to visit.   No current facility-administered medications on file prior to visit.    Allergies  Allergen Reactions  . Clarithromycin     REACTION: Trouble breathing - stomach upset - bad taste in mouth  . Codeine     REACTION: anti phylactic shock  . Codeine Phosphate     REACTION: unspecified  . Erythromycin Base Other (See Comments)    Eye ointment - worsened conjunctivitis and swelling  . Morphine     REACTION: swelling  . Morphine Sulfate     REACTION: unspecified  . Sulfamethoxazole-Trimethoprim     REACTION: N \\T \ V, rash    Past Medical History  Diagnosis Date  . Chlamydia     as a teen  . Anxiety   . Bipolar 1 disorder   . MRSA (methicillin resistant staph aureus) culture positive   . Alcohol abuse     last drink one year ago    Past Surgical History  Procedure Laterality Date  . Btl  11/09/1998  . Leep  2000  . Dilation and curettage of uterus  1997    after miscarriage  . Misscarriage      x5  . Nsvd      3  . Neuroma surgery      lipoma removal Right labia    Family History  Problem Relation Age of Onset  . Hypertension Mother   . Hyperlipidemia Mother   . Depression Mother   . Bipolar disorder Sister   . Depression Maternal Grandmother     History   Social History  . Marital Status: Legally Separated    Spouse  Name: N/A    Number of Children: 3  . Years of Education: N/A   Occupational History  .  Lab [employer]   Social History Main Topics  . Smoking status: Never Smoker   . Smokeless tobacco: Never Used  . Alcohol Use: No  . Drug Use: No  . Sexual Activity: Not on file   Other Topics Concern  . Not on file   Social History Narrative  . No narrative on file   The PMH, PSH, Social History, Family History, Medications, and allergies have been reviewed in [facility], and have been updated if relevant.   Review of Systems    See HPI No n/v/d No CP No SOB No photophobia No blurred vision +anxiety but feels "ok" - feels she is at her baseline No SI or HI Objective:    BP 122/76  Pulse 65  Temp(Src) 98.1 F (36.7 C) (Oral)  Wt 183 lb 8 oz (83.235 kg)  SpO2 97%   Physical Exam  Nursing note and vitals reviewed. Constitutional: She appears well-developed and well-nourished. No distress.  HENT:  Head: Normocephalic.  Right Ear: Tympanic membrane is  retracted.  Left Ear: Tympanic membrane is retracted.  Nose: Rhinorrhea and sinus tenderness present. Right sinus exhibits frontal sinus tenderness. Left sinus exhibits frontal sinus tenderness.  Pulmonary/Chest: Effort normal and breath sounds normal. No respiratory distress.  Skin: Skin is warm, dry and intact.  Psychiatric: Her mood appears anxious.          Assessment & Plan:   PANIC ATTACKS  Other acute sinusitis No Follow-up on file.

## 2014-06-28 NOTE — Assessment & Plan Note (Signed)
Persistent, chronic issue. Followed by PCP. No changes made to rx today- she feels she is at her baseline.

## 2014-07-12 ENCOUNTER — Ambulatory Visit: Payer: Self-pay | Admitting: Family Medicine

## 2014-07-13 ENCOUNTER — Telehealth: Payer: Self-pay | Admitting: Family Medicine

## 2014-07-13 ENCOUNTER — Encounter: Payer: Self-pay | Admitting: Family Medicine

## 2014-07-13 ENCOUNTER — Ambulatory Visit (INDEPENDENT_AMBULATORY_CARE_PROVIDER_SITE_OTHER): Payer: 59 | Admitting: Family Medicine

## 2014-07-13 VITALS — BP 106/68 | HR 67 | Temp 98.2°F | Wt 184.5 lb

## 2014-07-13 DIAGNOSIS — J018 Other acute sinusitis: Secondary | ICD-10-CM

## 2014-07-13 MED ORDER — AMOXICILLIN-POT CLAVULANATE 875-125 MG PO TABS: 1.0000 | ORAL_TABLET | Freq: Two times a day (BID) | ORAL | Status: DC

## 2014-07-13 MED ORDER — FLUCONAZOLE 150 MG PO TABS: 150.0000 mg | ORAL_TABLET | Freq: Once | ORAL | Status: DC

## 2014-07-13 MED ORDER — FLUTICASONE PROPIONATE 50 MCG/ACT NA SUSP: 2.0000 | Freq: Every day | NASAL | Status: DC

## 2014-07-13 NOTE — Telephone Encounter (Signed)
i am not sure what i was seeing her about.   The patient can follow-up with pcp at her discretion.

## 2014-07-13 NOTE — Progress Notes (Signed)
Pre visit review using our clinic review tool, if applicable. No additional management support is needed unless otherwise documented below in the visit note.  duration of symptoms: 2 weeks. Prev on amoxil, had a yeast infection- treated yeast infection and resolved, some dec in URI sx but then URI sx increased as soon as she was done with the amoxil Rhinorrhea: no congestion:yes ear pain:no, see below, feels "clogged" sore throat:no but roof of mouth is irritated.  cough:no myalgias:no other concerns: frequent ear popping on the R ear Facial pain noted.   No tooth pain.   Was prev on flonase.    ROS: See HPI.  Otherwise negative.    Meds, vitals, and allergies reviewed.   GEN: nad, alert and oriented HEENT: mucous membranes moist, TM w/o erythema, nasal epithelium injected, OP with cobblestoning, max sinuses ttp B NECK: supple w/o LA CV: rrr. PULM: ctab, no inc wob ABD: soft, +bs EXT: no edema

## 2014-07-13 NOTE — Telephone Encounter (Signed)
Patient called.  She missed her appointment yesterday.  Her daughter fainted at school and she had to take her to the doctor.  She apologized for missing the appointment and said she'll call back next week to reschedule her appointment.

## 2014-07-13 NOTE — Patient Instructions (Signed)
Keep using flonase.  Warm compresses on your face may help with the pain.  Start augmentin.  Use diflucan if you have a yeast infection.  Take care.  Glad to see you.

## 2014-07-13 NOTE — Telephone Encounter (Signed)
Patient did not come for their scheduled appointment 07/12/14.  Please let me know if the patient needs to be contacted immediately for follow up or if no follow up is necessary.

## 2014-07-13 NOTE — Telephone Encounter (Signed)
ok 

## 2014-07-16 NOTE — Assessment & Plan Note (Signed)
Likely not resolve with amoxil, change to augmentin, d/w pt.  Hold diflucan for now, use if needed.  She agrees.  Nontoxic. F/u prn.

## 2014-07-31 ENCOUNTER — Ambulatory Visit (INDEPENDENT_AMBULATORY_CARE_PROVIDER_SITE_OTHER): Payer: 59 | Admitting: Psychology

## 2014-07-31 DIAGNOSIS — F4310 Post-traumatic stress disorder, unspecified: Secondary | ICD-10-CM

## 2014-07-31 DIAGNOSIS — F319 Bipolar disorder, unspecified: Secondary | ICD-10-CM

## 2014-08-07 ENCOUNTER — Ambulatory Visit (INDEPENDENT_AMBULATORY_CARE_PROVIDER_SITE_OTHER): Payer: 59 | Admitting: Psychology

## 2014-08-07 DIAGNOSIS — F4323 Adjustment disorder with mixed anxiety and depressed mood: Secondary | ICD-10-CM

## 2014-09-11 ENCOUNTER — Ambulatory Visit: Payer: 59 | Admitting: Psychology

## 2014-09-18 ENCOUNTER — Ambulatory Visit: Payer: 59 | Admitting: Psychology

## 2014-10-05 ENCOUNTER — Encounter: Payer: Self-pay | Admitting: Family Medicine

## 2014-10-05 ENCOUNTER — Ambulatory Visit (INDEPENDENT_AMBULATORY_CARE_PROVIDER_SITE_OTHER): Payer: 59 | Admitting: Family Medicine

## 2014-10-05 ENCOUNTER — Ambulatory Visit: Payer: Self-pay | Admitting: Family Medicine

## 2014-10-05 VITALS — BP 118/68 | HR 72 | Temp 98.3°F | Ht 65.0 in | Wt 189.5 lb

## 2014-10-05 DIAGNOSIS — J0101 Acute recurrent maxillary sinusitis: Secondary | ICD-10-CM

## 2014-10-05 DIAGNOSIS — J301 Allergic rhinitis due to pollen: Secondary | ICD-10-CM

## 2014-10-05 MED ORDER — FLUCONAZOLE 150 MG PO TABS: 150.0000 mg | ORAL_TABLET | Freq: Once | ORAL | Status: DC

## 2014-10-05 MED ORDER — AMOXICILLIN-POT CLAVULANATE 875-125 MG PO TABS: 1.0000 | ORAL_TABLET | Freq: Two times a day (BID) | ORAL | Status: DC

## 2014-10-05 MED ORDER — FLUTICASONE PROPIONATE 50 MCG/ACT NA SUSP: 2.0000 | Freq: Every day | NASAL | Status: DC

## 2014-10-05 NOTE — Progress Notes (Signed)
Pre visit review using our clinic review tool, if applicable. No additional management support is needed unless otherwise documented below in the visit note. 

## 2014-10-05 NOTE — Progress Notes (Signed)
Dr. Frederico Hamman T. Bolton Canupp, MD, [facility] [place], [address] Phone: [phone] Fax: [phone]  10/05/2014  Patient: Lacey Decker, MRN: [id number], DOB: [birth year], 43 y.o.  Primary Physician:  Eliezer Lofts, MD  Chief Complaint: Cough and Sinusitis  Subjective:   Lacey Decker is a 43 y.o. very pleasant female patient who presents with the following:  Not feeling well, gasping for breath some. Face is really hurting - whole face and palate. Several days - did not sleep at all on thanksgiving.   Very pleasant patient who I remember well who has a history of poorly controlled ALLERGIC rhinitis, and she currently is taking Zyrtec as well as some Flonase for this.  Over the last week or so, she is really started to feel poorly, she has been coughing and gasping for breath sometimes.  She is primarily having pain in the maxillary facial region, and also some in her palate.  Past Medical History, Surgical History, Social History, Family History, Problem List, Medications, and Allergies have been reviewed and updated if relevant.  ROS: GEN: Acute illness details above GI: Tolerating PO intake GU: maintaining adequate hydration and urination Pulm: No SOB Interactive and getting along well at home.  Otherwise, ROS is as per the HPI.   Objective:   BP 118/68 mmHg  Pulse 72  Temp(Src) 98.3 F (36.8 C) (Oral)  Ht 5\' 5"  (1.651 m)  Wt 189 lb 8 oz (85.957 kg)  BMI 31.53 kg/m2  SpO2 98%   Gen: WDWN, NAD; alert,appropriate and cooperative throughout exam  HEENT: Normocephalic and atraumatic. Throat clear, w/o exudate, no LAD, R TM clear, L TM - good landmarks, No fluid present. rhinnorhea.  Left frontal and maxillary sinuses: Tender, max Right frontal and maxillary sinuses: Tender, max  Neck: No ant or post LAD CV: RRR, No M/G/R Pulm: Breathing comfortably in no resp distress. no w/c/r Abd: S,NT,ND,+BS Extr: no  c/c/e Psych: full affect, pleasant    Laboratory and Imaging Data:  Assessment and Plan:   Acute recurrent maxillary sinusitis  Allergic rhinitis due to pollen   Acute sinusitis: ABX as below.   Reviewed symptomatic care as well as ABX in this case.    flonase refill  Follow-up: No Follow-up on file.  New Prescriptions   FLUCONAZOLE (DIFLUCAN) 150 MG TABLET    Take 1 tablet (150 mg total) by mouth once. Repeat if needed   No orders of the defined types were placed in this encounter.    Signed,  Maud Deed. Tetsuo Coppola, MD   Patient's Medications  New Prescriptions   FLUCONAZOLE (DIFLUCAN) 150 MG TABLET    Take 1 tablet (150 mg total) by mouth once. Repeat if needed  Previous Medications   CETIRIZINE (ZYRTEC) 10 MG TABLET    Take 10 mg by mouth daily.  Modified Medications   Modified Medication Previous Medication   AMOXICILLIN-CLAVULANATE (AUGMENTIN) 875-125 MG PER TABLET amoxicillin-clavulanate (AUGMENTIN) 875-125 MG per tablet      Take 1 tablet by mouth 2 (two) times daily.    Take 1 tablet by mouth 2 (two) times daily.   FLUTICASONE (FLONASE) 50 MCG/ACT NASAL SPRAY fluticasone (FLONASE) 50 MCG/ACT nasal spray      Place 2 sprays into both nostrils daily.    Place 2 sprays into both nostrils daily.  Discontinued Medications   FLUCONAZOLE (DIFLUCAN) 150 MG TABLET    Take 1 tablet (150 mg total) by mouth once. Repeat  in 1 week if needed.

## 2014-10-16 ENCOUNTER — Ambulatory Visit (INDEPENDENT_AMBULATORY_CARE_PROVIDER_SITE_OTHER): Payer: 59 | Admitting: Psychology

## 2014-10-16 DIAGNOSIS — F4310 Post-traumatic stress disorder, unspecified: Secondary | ICD-10-CM

## 2014-10-16 DIAGNOSIS — F319 Bipolar disorder, unspecified: Secondary | ICD-10-CM

## 2014-11-23 ENCOUNTER — Telehealth: Payer: Self-pay | Admitting: *Deleted

## 2014-11-23 NOTE — Telephone Encounter (Signed)
Lm on pts vm requesting a call back if wanting to schedule a flu shot 

## 2014-11-27 ENCOUNTER — Ambulatory Visit: Payer: 59 | Admitting: Psychology

## 2014-12-11 ENCOUNTER — Encounter: Payer: Self-pay | Admitting: Family Medicine

## 2014-12-11 ENCOUNTER — Ambulatory Visit (INDEPENDENT_AMBULATORY_CARE_PROVIDER_SITE_OTHER): Payer: 59 | Admitting: Family Medicine

## 2014-12-11 VITALS — BP 126/70 | HR 72 | Temp 98.6°F | Wt 198.0 lb

## 2014-12-11 DIAGNOSIS — R350 Frequency of micturition: Secondary | ICD-10-CM

## 2014-12-11 DIAGNOSIS — R300 Dysuria: Secondary | ICD-10-CM

## 2014-12-11 LAB — POCT URINALYSIS DIPSTICK
Bilirubin, UA: NEGATIVE
Blood, UA: NEGATIVE
Glucose, UA: NEGATIVE
Ketones, UA: NEGATIVE
Leukocytes, UA: NEGATIVE
Nitrite, UA: NEGATIVE
Protein, UA: NEGATIVE
Spec Grav, UA: 1.015
Urobilinogen, UA: 4
pH, UA: 6

## 2014-12-11 NOTE — Progress Notes (Signed)
Pre visit review using our clinic review tool, if applicable. No additional management support is needed unless otherwise documented below in the visit note.  Recently with some lower R back pain after shoveling snow.   Noted that her fingers were puffy recently.   Dysuria: yes, frequency.  Not burning with frequency.  Each void is moderate amount.   duration of symptoms: for about 1 weeks abdominal pain: no fevers:no back pain:see above Vomiting:no  Meds, vitals, and allergies reviewed.   ROS: See HPI.  Otherwise negative.    GEN: nad, alert and oriented HEENT: mucous membranes moist NECK: supple CV: rrr.  PULM: ctab, no inc wob ABD: soft, +bs, suprapubic area not tender EXT: no edema SKIN: no acute rash BACK: no CVA pain

## 2014-12-11 NOTE — Patient Instructions (Signed)
Drink plenty of water and we'll contact you with your lab report.  Take care.   

## 2014-12-12 NOTE — Assessment & Plan Note (Signed)
Back pain likely unrelated, benign msk source.  Likely that she has some recent fluid retention, hands were puffy prev.  Now with urinary frequency that is corrective.  Check ucx to make sure, but uti not likely.  She agrees.  F/u prn.  She incidentally asked about weight loss treatment, ie meds, and I'll defer to PCP.

## 2014-12-13 LAB — URINE CULTURE: Organism ID, Bacteria: NO GROWTH

## 2015-02-26 ENCOUNTER — Ambulatory Visit (INDEPENDENT_AMBULATORY_CARE_PROVIDER_SITE_OTHER): Payer: 59 | Admitting: Psychology

## 2015-02-26 DIAGNOSIS — F4310 Post-traumatic stress disorder, unspecified: Secondary | ICD-10-CM

## 2015-02-26 DIAGNOSIS — F319 Bipolar disorder, unspecified: Secondary | ICD-10-CM

## 2015-03-03 NOTE — Consult Note (Signed)
PATIENT NAME:  Lacey Decker, Lacey Decker MR#:  [id number] DATE OF BIRTH:  [birth year]  DATE OF CONSULTATION:  04/22/2012  REFERRING PHYSICIAN:  Not dictated CONSULTING PHYSICIAN:  Gonzella Lex, MD  IDENTIFYING INFORMATION AND REASON FOR CONSULT: This is a 44 year old woman who came into the Emergency Room yesterday having a panic attack. Consult for evaluation of need for psychiatric treatment.   HISTORY OF PRESENT ILLNESS: The patient reports that yesterday she was at work when she began to have symptoms characteristic of a panic attack. She describes her lips becoming numb, her arms starting to hurt, her chest feeling tight and hurting, and feeling overwhelmed with anxiety. She tried taking a Xanax but decided that it would be better for her to go home. After consulting her supervisor, she was on her way home when she felt like she needed to stop at her primary care doctor's office to have her blood pressure checked. While there, the primary care doctor's staff decided that she needed to come to the Emergency Room and referred her over here for further evaluation. Since being here, the patient has had some subjective anxiety but has remained physically stable. At no point during any of this did she voice any suicidal ideation or engage in any dangerous or suicidal behavior. The patient reports that she has been under a lot of stress recently because of her recent divorce. She is working and taking care of three children, all of whom live with her. In addition, she says she has chronic anxiety symptoms as well as a diagnosis of bipolar disorder not otherwise specified, which she chooses to manage not taking any medication, only getting therapy. She denies that she has been drinking or using any drugs.   PAST PSYCHIATRIC HISTORY:  One previous hospitalization at age 11 for running away behavior. Denies ever having tried to kill herself in the past. Denies any history of violence. She denies any psychosis. She has  been treated with medications targeting bipolar disorder in the past but has had multiple side effects which have induced in her what she calls a "phobia" of all medications such that she prefers not to even try further psychiatric medicines. Despite this, she does occasionally take p.r.n. Ativan for her panic attacks. She denies that she has ever tried to kill herself in the past.   PAST MEDICAL HISTORY: The patient has no significant ongoing medical problems.   SOCIAL HISTORY: She recently left her husband earlier this year. She is working at [employer]. She has three children ranging from their teens to early 20s, all of whom live with her. She finds her life quite stressful.   SUBSTANCE HISTORY: She denies that she drinks alcohol regularly or abuses drugs and denies any past history of substance abuse.   CURRENT MEDICATIONS:  1. She is taking p.r.n. Ativan occasionally, but she emphasizes to me that it is very infrequent.  2. She takes Zyrtec for allergies. 3. She takes omeprazole for gastric reflux symptoms.   ALLERGIES: Codeine and morphine.   REVIEW OF SYSTEMS: Currently says she has no specific physical symptoms. Mood is feeling anxious about being in the hospital. Thoughts are lucid. Denies hallucinations. Denies suicidal ideation.   MENTAL STATUS EXAM: Somewhat disheveled woman interviewed in the Emergency Room. Cooperation was good. Eye contact was good. Psychomotor activity normal. Speech was slightly quiet but easy to understand. Normal tone and volume. Affect was a little bit tearful when describing her recent stresses, but reactive. Mood was stated as being okay.  Thoughts were lucid with no evidence of loosening of associations or delusional thinking. Denies suicidal or homicidal ideation. Says that she is actually very afraid of dying, which is what she gets afraid of when she has a panic attack. Denies hallucinations. Insight and judgment currently adequate.   RESULTS OF LABORATORY  TESTS: Drug screen is negative. TSH is slightly low at 0.34. No significant abnormalities.   ASSESSMENT: 44 year old woman who had a panic attack yesterday. She has occasional panic attacks often in stressful situations. At no time was she dangerous to herself or others. She is currently getting what sounds like appropriate outpatient treatment, seeing a therapist regularly. She also has a diagnosis of bipolar disorder. I did not go through a complete history but it sounds to me like it is more anxiety symptoms most of the time and depression. In any case, right now she seems to have enough information to make an informed judgment about her medication use. Currently, there is no sign of any dangerousness to herself or others. No need for hospital level treatment.   TREATMENT PLAN: The patient was given education about panic attacks, much of which she already has. She was given some information about appropriate outpatient treatment with therapy and medication options. She was encouraged to follow up with her therapist at her earliest opportunity. No further treatment needed at this point. Discussed the case with the Emergency Room doctor who is agreeable to her being discharged.   DIAGNOSIS PRINCIPLE AND PRIMARY:  AXIS I: Panic disorder without agoraphobia.   SECONDARY DIAGNOSES:  AXIS I: Bipolar disorder, not otherwise specified.   AXIS II: Deferred.   AXIS III: No diagnosis.   AXIS IV: Moderate to severe stress from recent divorce, heavy workload, parental responsibilities.   AXIS V: Functioning at time of evaluation: 44.    ____________________________ Gonzella Lex, MD jtc:bjt D: 04/22/2012 13:55:16 ET T: 04/22/2012 17:14:06 ET JOB#: [id number]  cc: Gonzella Lex, MD, <Dictator> Gonzella Lex MD ELECTRONICALLY SIGNED 04/23/2012 15:14

## 2015-03-03 NOTE — Consult Note (Signed)
Brief Consult Note: Diagnosis: panic disorder.   Patient was seen by consultant.   Consult note dictated.   Discussed with Attending MD.   Comments: Patient seen by psychiatry. Patient with history of panic disorder and bipolar do nos had a panic attack at work yesterday. Her PCP had her come to ER. Patient is medically stable and no longer panicing. Denies any suicidal or homicidal ideatiomn. Denies hx of suicidality. Has a home and established outpt provider with Dr Rexene Edison at [facility]. No need for further tx in er. Discussed with Dr Prince Rome. Advise she be discharged.  Electronic Signatures: Gonzella Lex (MD)  (Signed 14-Jun-13 13:49)  Authored: Brief Consult Note   Last Updated: 14-Jun-13 13:49 by Gonzella Lex (MD)

## 2015-03-11 ENCOUNTER — Telehealth: Payer: Self-pay | Admitting: Family Medicine

## 2015-03-11 NOTE — Telephone Encounter (Signed)
Noted  

## 2015-03-11 NOTE — Telephone Encounter (Signed)
Patient Name: ADARA KITTLE  DOB: 04-22-1971    Initial Comment Caller states she got bit by something, thinks it may be a spider. Red and painful, and it's getting bigger. She is getting some anxiety over this and feeling nausea.   Nurse Assessment  Nurse: Leilani Merl, RN, Heather Date/Time (Eastern Time): 03/11/2015 1:49:19 PM  Confirm and document reason for call. If symptomatic, describe symptoms. ---Caller states she got bit by something on her lower leg, thinks it may be a spider. Red and painful, and it's getting bigger. She is getting some anxiety over this and feeling nausea. She noticed the bite this morning. It is almost about the size of a dime.  Has the patient traveled out of the country within the last 30 days? ---Not Applicable  Does the patient require triage? ---Yes  Related visit to physician within the last 2 weeks? ---No  Does the PT have any chronic conditions? (i.e. diabetes, asthma, etc.) ---No  Did the patient indicate they were pregnant? ---No     Guidelines    Guideline Title Affirmed Question Affirmed Notes  Spider Bite - [place] Non-serious spider bite (all triage questions negative)    Final Disposition User   [place], RN, [employer]

## 2015-08-12 ENCOUNTER — Ambulatory Visit (INDEPENDENT_AMBULATORY_CARE_PROVIDER_SITE_OTHER): Payer: 59 | Admitting: Family Medicine

## 2015-08-12 ENCOUNTER — Encounter: Payer: Self-pay | Admitting: Family Medicine

## 2015-08-12 VITALS — BP 128/84 | HR 68 | Temp 98.4°F | Wt 197.5 lb

## 2015-08-12 DIAGNOSIS — Z23 Encounter for immunization: Secondary | ICD-10-CM

## 2015-08-12 DIAGNOSIS — R600 Localized edema: Secondary | ICD-10-CM | POA: Diagnosis not present

## 2015-08-12 DIAGNOSIS — M542 Cervicalgia: Secondary | ICD-10-CM | POA: Diagnosis not present

## 2015-08-12 MED ORDER — CYCLOBENZAPRINE HCL 10 MG PO TABS: 5.0000 mg | ORAL_TABLET | Freq: Two times a day (BID) | ORAL | Status: DC | PRN

## 2015-08-12 MED ORDER — HYDROCHLOROTHIAZIDE 12.5 MG PO CAPS: 12.5000 mg | ORAL_CAPSULE | Freq: Every day | ORAL | Status: DC | PRN

## 2015-08-12 NOTE — Progress Notes (Signed)
BP 128/84 mmHg  Pulse 68  Temp(Src) 98.4 F (36.9 C) (Oral)  Wt 197 lb 8 oz (89.585 kg)   CC: ankle swelling, shoulder pain  Subjective:    Patient ID: Lacey Decker, female    DOB: 11/26/1970, 44 y.o.   MRN: [id number]  HPI: Lacey Decker is a 44 y.o. female presenting on 08/12/2015 for Edema and Shoulder Pain   Very anxious.  Leg swelling ongoing for years L>R. Endorses "fluid pocket" anterior leg just below knee. Legs stay achey. Has never used compressions stockings. No recent prolonged travel. Denies inciting trauma injury or falls.   H/o sciatica on left in the past but this is different.   R upper back pain radiating to neck ongoing for 2 years. Has been treated with flexeril in the past. Told she carries stress on arms. No shooting pain down arms. No numbness/weakness of arms. Denies inciting trauma injury or fall.   Works from home - sits at desk 8-12 hours/day. Currently for [employer]. Wonders about standup desk for work. States form needed for this (She brought this in today).   Relevant past medical, surgical, family and social history reviewed and updated as indicated. Interim medical history since our last visit reviewed. Allergies and medications reviewed and updated. Current Outpatient Prescriptions on File Prior to Visit  Medication Sig  . cetirizine (ZYRTEC) 10 MG tablet Take 10 mg by mouth daily.  . fluticasone (FLONASE) 50 MCG/ACT nasal spray Place 2 sprays into both nostrils daily.  Marland Kitchen omeprazole (PRILOSEC) 40 MG capsule Take 40 mg by mouth daily.   No current facility-administered medications on file prior to visit.    Review of Systems Per HPI unless specifically indicated above     Objective:    BP 128/84 mmHg  Pulse 68  Temp(Src) 98.4 F (36.9 C) (Oral)  Wt 197 lb 8 oz (89.585 kg)  Wt Readings from Last 3 Encounters:  08/12/15 197 lb 8 oz (89.585 kg)  12/11/14 198 lb (89.812 kg)  10/05/14 189 lb 8 oz (85.957 kg)   Body mass index  is 32.87 kg/(m^2).  Physical Exam  Constitutional: She appears well-developed and well-nourished. No distress.  Musculoskeletal: Normal range of motion. She exhibits edema (nonpitting edema throughout lower legs).  FROM at cervical neck and shoulders. No tenderness at midline cervical neck or paracervical mm No pain to palpation rhomboids. + pain/tightness present R trapezius muscle No tenderness to palpation at shoulder anatomy.  No pain with testing SITS against resistance. Negative empty can sign.  1+ DP bilaterally Sensation intact Nonpitting edema present throughout lower legs Mild swelling L>R anteriomedial leg inferior to tibial tuberosity No palpable cords  Skin: Skin is warm and dry. No rash noted.  Psychiatric: Her mood appears anxious.  Nursing note and vitals reviewed.      Assessment & Plan:  Over 25 minutes were spent face-to-face with the patient during this encounter and >50% of that time was spent on counseling and coordination of care   Problem List Items Addressed This Visit    Pedal edema    Anticipate multifactorial including obesity and chronic venous insufficiency related. Discussed elevation of legs, water intake, and avoiding salt. Recommended trial of compression stockings - script for knee high 10-87mmHg strength provided today. rec f/u with PCP in 1-2 mo.       Neck pain on right side - Primary    Consistent with chronic R neck muscle spasm/tightness. Flexeril has been helpful in the past -  refilled. She has schedule first meeting with personal trainer. She has seen PT for this in the past (around 2014). Unclear benefit. Discussed scheduling massage, and alternating ice/heating pad for relief.  Probably related to poor ergonomics at work - seated position at home for 8-12 hour days. Discussed ideal seating arrangement. Pt feels may benefit from adjustable work desk and I agree - will fill out forms she brings for this end.       Other Visit Diagnoses      Need for influenza vaccination        Relevant Orders    Flu Vaccine QUAD 36+ mos PF IM (Fluarix & Fluzone Quad PF) (Completed)        Follow up plan: Return in about 1 month (around 09/12/2015), or as needed, for follow up visit.

## 2015-08-12 NOTE — Progress Notes (Signed)
Pre visit review using our clinic review tool, if applicable. No additional management support is needed unless otherwise documented below in the visit note. 

## 2015-08-12 NOTE — Patient Instructions (Addendum)
Flu shot today. For right neck - chronic neck strain/spasm. Treat with flexeril (sedation precautions) try 1/2 tablet of 10mg  first. May benefit from adjustable desk - form filled out.  For legs - I think this is from chronic venous insufficiency. Elevate legs as much as able. Try compression stockings. May try hydrochlorothiazide water pill as needed for leg swelling.  Return this afternoon or later in the week to pick up script for compression stockings and forms.  Return for follow up with Dr Diona Browner in next few months.

## 2015-08-12 NOTE — Assessment & Plan Note (Signed)
Consistent with chronic R neck muscle spasm/tightness. Flexeril has been helpful in the past - refilled. She has schedule first meeting with personal trainer. She has seen PT for this in the past (around 2014). Unclear benefit. Discussed scheduling massage, and alternating ice/heating pad for relief.  Probably related to poor ergonomics at work - seated position at home for 8-12 hour days. Discussed ideal seating arrangement. Pt feels may benefit from adjustable work desk and I agree - will fill out forms she brings for this end.

## 2015-08-12 NOTE — Assessment & Plan Note (Addendum)
Anticipate multifactorial including obesity and chronic venous insufficiency related. Discussed elevation of legs, water intake, and avoiding salt. Recommended trial of compression stockings - script for knee high 10-52mmHg strength provided today. rec f/u with PCP in 1-2 mo.

## 2015-11-05 ENCOUNTER — Ambulatory Visit: Payer: Self-pay | Admitting: Family Medicine

## 2015-11-06 ENCOUNTER — Ambulatory Visit (INDEPENDENT_AMBULATORY_CARE_PROVIDER_SITE_OTHER): Payer: 59 | Admitting: Internal Medicine

## 2015-11-06 ENCOUNTER — Telehealth: Payer: Self-pay

## 2015-11-06 ENCOUNTER — Encounter: Payer: Self-pay | Admitting: Internal Medicine

## 2015-11-06 VITALS — BP 112/80 | HR 68 | Temp 98.1°F | Wt 203.0 lb

## 2015-11-06 DIAGNOSIS — J0100 Acute maxillary sinusitis, unspecified: Secondary | ICD-10-CM | POA: Diagnosis not present

## 2015-11-06 MED ORDER — AMOXICILLIN 500 MG PO TABS: 1000.0000 mg | ORAL_TABLET | Freq: Two times a day (BID) | ORAL | Status: DC

## 2015-11-06 NOTE — Assessment & Plan Note (Signed)
Right maxilla and frontal pain Sick for a week Will Rx with amoxil (she tolerates) Discussed supportive care---especially analgesics

## 2015-11-06 NOTE — Telephone Encounter (Signed)
Lacey Decker with [facility] called to ck on giving amoxil with pt having intolerance to augmentin. Manuella Ghazi per 11/06/15 office note that Dr Silvio Pate listed will rx with amoxil (she tolerates). Vicente Males voiced understanding. Will send to Dr Silvio Pate as Juluis Rainier.

## 2015-11-06 NOTE — Telephone Encounter (Signed)
Yes--- she has tolerated amoxicillin

## 2015-11-06 NOTE — Progress Notes (Signed)
Subjective:    Patient ID: Lacey Decker, female    DOB: 07-29-1971, 44 y.o.   MRN: [id number]  HPI Here due to cough  Woke up sick Thanksgiving Still sick after a month dayquil and nyquil with brief symptomatic relief Thinks it is sinus infection--forehead pain, right maxillary pain and ear pain Also right jaw pain  Feels "raw" due to the cough Sitting up in bed--but still coughs  Fever ~ 2weeks ago No night sweats or chills No SOB  Tried tylenol severe sinus last night--due to severe pain More drainage this AM  Current Outpatient Prescriptions on File Prior to Visit  Medication Sig Dispense Refill  . cetirizine (ZYRTEC) 10 MG tablet Take 10 mg by mouth daily.    . cyclobenzaprine (FLEXERIL) 10 MG tablet Take 0.5-1 tablets (5-10 mg total) by mouth 2 (two) times daily as needed for muscle spasms. 30 tablet 0  . fluticasone (FLONASE) 50 MCG/ACT nasal spray Place 2 sprays into both nostrils daily. 48 g 3  . hydrochlorothiazide (MICROZIDE) 12.5 MG capsule Take 1 capsule (12.5 mg total) by mouth daily as needed (swelling). 30 capsule 1  . omeprazole (PRILOSEC) 40 MG capsule Take 40 mg by mouth daily.     No current facility-administered medications on file prior to visit.    Allergies  Allergen Reactions  . Codeine     REACTION: anaphylactic shock  . Morphine     REACTION: swelling  . Augmentin [Amoxicillin-Pot Clavulanate]   . Clarithromycin     REACTION: Trouble breathing - stomach upset - bad taste in mouth  . Codeine Phosphate     REACTION: unspecified  . Erythromycin Base Other (See Comments)    Eye ointment - worsened conjunctivitis and swelling  . Morphine Sulfate     REACTION: unspecified  . Sulfamethoxazole-Trimethoprim     REACTION: N \\T \ V, rash    Past Medical History  Diagnosis Date  . Chlamydia     as a teen  . Anxiety   . Bipolar 1 disorder ([place])   . MRSA (methicillin resistant staph aureus) culture positive   . Alcohol abuse     last drink  one year ago    Past Surgical History  Procedure Laterality Date  . Btl  11/09/1998  . Leep  2000  . Dilation and curettage of uterus  1997    after miscarriage  . Misscarriage      x5  . Nsvd      3  . Neuroma surgery      lipoma removal Right labia    Family History  Problem Relation Age of Onset  . Hypertension Mother   . Hyperlipidemia Mother   . Depression Mother   . Bipolar disorder Sister   . Depression Maternal Grandmother     Social History   Social History  . Marital Status: Married    Spouse Name: N/A  . Number of Children: 3  . Years of Education: N/A   Occupational History  .  Lab [employer]   Social History Main Topics  . Smoking status: Never Smoker   . Smokeless tobacco: Never Used  . Alcohol Use: No  . Drug Use: No  . Sexual Activity: Not on file   Other Topics Concern  . Not on file   Social History Narrative   Review of Systems  Taste is off so no appetite No vomiting or diarrhea No rash     Objective:   Physical Exam  Constitutional: She  appears well-developed and well-nourished. No distress.  HENT:  Mouth/Throat: Oropharynx is clear and moist. No oropharyngeal exudate.  Right maxillary and frontal tenderness TMs normal Moderate nasal inflammation  Eyes:  Allergic shiners  Neck: Normal range of motion. Neck supple.  Pulmonary/Chest: Effort normal and breath sounds normal. No respiratory distress. She has no wheezes. She has no rales.  Lymphadenopathy:    She has no cervical adenopathy.  Skin: No rash noted.          Assessment & Plan:

## 2015-11-20 ENCOUNTER — Telehealth: Payer: Self-pay | Admitting: Family Medicine

## 2015-11-20 MED ORDER — FLUCONAZOLE 150 MG PO TABS: 150.0000 mg | ORAL_TABLET | Freq: Once | ORAL | Status: DC

## 2015-11-20 NOTE — Telephone Encounter (Signed)
Pt called stating she was in 12/28 and received a rx for antibiotic she now has yeast infection Can something be called in for her [facility]  Please advise

## 2015-11-20 NOTE — Telephone Encounter (Signed)
Prescription sent as instructed by Dr. Lorelei Pont.  [facility] notified.

## 2015-11-20 NOTE — Telephone Encounter (Signed)
Diflucan 150 mg, 1 po now, 0 ref #1

## 2016-04-06 ENCOUNTER — Emergency Department
Admission: EM | Admit: 2016-04-06 | Discharge: 2016-04-06 | Disposition: A | Discharge status: Home / Self Care | Payer: 59 | Attending: Emergency Medicine | Admitting: Emergency Medicine

## 2016-04-06 DIAGNOSIS — L03114 Cellulitis of left upper limb: Secondary | ICD-10-CM | POA: Insufficient documentation

## 2016-04-06 DIAGNOSIS — Z79899 Other long term (current) drug therapy: Secondary | ICD-10-CM | POA: Insufficient documentation

## 2016-04-06 DIAGNOSIS — F319 Bipolar disorder, unspecified: Secondary | ICD-10-CM | POA: Insufficient documentation

## 2016-04-06 DIAGNOSIS — L539 Erythematous condition, unspecified: Secondary | ICD-10-CM | POA: Diagnosis present

## 2016-04-06 DIAGNOSIS — L03818 Cellulitis of other sites: Secondary | ICD-10-CM

## 2016-04-06 MED ORDER — DOXYCYCLINE HYCLATE 100 MG PO CAPS: 100.0000 mg | ORAL_CAPSULE | Freq: Two times a day (BID) | ORAL | Status: DC

## 2016-04-06 MED ORDER — NAPROXEN 500 MG PO TABS: 500.0000 mg | ORAL_TABLET | Freq: Two times a day (BID) | ORAL | Status: DC

## 2016-04-06 NOTE — ED Provider Notes (Signed)
[facility] Emergency Department Provider Note  ____________________________________________  Time seen: Approximately 4:25 PM  I have reviewed the triage vital signs and the nursing notes.   HISTORY  Chief Complaint Abscess   HPI Lacey Decker is a 45 y.o. female who presents to the emergency department for evaluation of an abscess to the left shoulder.She states that a few days ago she [place], and felt something bite her in the middle the night. Since that time she has had a tender area just on her back towards her left shoulder. She states the area has been increasingly painful and red over the past 24 hours. She has not taken any medications or applied any over-the-counter ointment to the area.  Past Medical History  Diagnosis Date  . Chlamydia     as a teen  . Anxiety   . Bipolar 1 disorder ([place])   . MRSA (methicillin resistant staph aureus) culture positive   . Alcohol abuse     last drink one year ago    Patient Active Problem List   Diagnosis Date Noted  . Pedal edema 08/12/2015  . Urinary frequency 12/12/2014  . Acute sinus infection 06/28/2014  . Abnormal findings on esophagogastroduodenoscopy (EGD) 10/27/2013  . Hot flashes 08/22/2013  . Other malaise and fatigue 08/22/2013  . Neck pain on right side 07/17/2013  . Medial epicondylitis of left elbow 07/17/2013  . Vaginitis and vulvovaginitis 06/30/2013  . Right sided abdominal pain 01/02/2013  . Right upper quadrant pain 01/08/2012  . OBESITY, UNSPECIFIED 11/21/2010  . ALLERGIC RHINITIS 11/21/2010  . MIGRAINE, COMMON, INTRACTABLE 01/29/2009  . MRSA 11/24/2008  . VERTEBROBASILAR INSUFFICIENCY 07/08/2007  . DSORD [place], MOST RECENT EPSD 01/26/2007  . Generalized anxiety disorder 01/06/2007  . PANIC ATTACKS 01/06/2007  . Esophageal reflux 01/06/2007  . DYSFUNCTIONAL UTERINE BLEEDING 01/06/2007  . ROSACEA 01/06/2007    Past Surgical History   Procedure Laterality Date  . Btl  11/09/1998  . Leep  2000  . Dilation and curettage of uterus  1997    after miscarriage  . Misscarriage      x5  . Nsvd      3  . Neuroma surgery      lipoma removal Right labia    Current Outpatient Rx  Name  Route  Sig  Dispense  Refill  . cetirizine (ZYRTEC) 10 MG tablet   Oral   Take 10 mg by mouth daily.         . cyclobenzaprine (FLEXERIL) 10 MG tablet   Oral   Take 0.5-1 tablets (5-10 mg total) by mouth 2 (two) times daily as needed for muscle spasms.   30 tablet   0   . doxycycline (VIBRAMYCIN) 100 MG capsule   Oral   Take 1 capsule (100 mg total) by mouth 2 (two) times daily.   20 capsule   0   . fluconazole (DIFLUCAN) 150 MG tablet   Oral   Take 1 tablet (150 mg total) by mouth once.   1 tablet   0   . fluticasone (FLONASE) 50 MCG/ACT nasal spray   Each Nare   Place 2 sprays into both nostrils daily.   48 g   3   . hydrochlorothiazide (MICROZIDE) 12.5 MG capsule   Oral   Take 1 capsule (12.5 mg total) by mouth daily as needed (swelling).   30 capsule   1   . naproxen (NAPROSYN) 500 MG  tablet   Oral   Take 1 tablet (500 mg total) by mouth 2 (two) times daily with a meal.   60 tablet   0   . omeprazole (PRILOSEC) 40 MG capsule   Oral   Take 40 mg by mouth daily.           Allergies Codeine; Morphine; Augmentin; Clarithromycin; Codeine phosphate; Erythromycin base; Morphine sulfate; and Sulfamethoxazole-trimethoprim  Family History  Problem Relation Age of Onset  . Hypertension Mother   . Hyperlipidemia Mother   . Depression Mother   . Bipolar disorder Sister   . Depression Maternal Grandmother     Social History Social History  Substance Use Topics  . Smoking status: Never Smoker   . Smokeless tobacco: Never Used  . Alcohol Use: No    Review of Systems  Constitutional: Negative for fever/chills Respiratory: Negative for shortness of breath. Musculoskeletal: Negative for pain. Skin:  Positive for tenderness and erythema. Neurological: Negative for headaches, focal weakness or numbness. ____________________________________________   PHYSICAL EXAM:  VITAL SIGNS: ED Triage Vitals  Enc Vitals Group     BP 04/06/16 1609 123/61 mmHg     Pulse Rate 04/06/16 1609 75     Resp 04/06/16 1609 18     Temp 04/06/16 1609 99.2 F (37.3 C)     Temp Source 04/06/16 1609 Oral     SpO2 04/06/16 1609 100 %     Weight 04/06/16 1609 190 lb (86.183 kg)     Height 04/06/16 1609 5\' 5"  (1.651 m)     Head Cir --      Peak Flow --      Pain Score 04/06/16 1609 5     Pain Loc --      Pain Edu? --      Excl. in [place]? --      Constitutional: Alert and oriented. Well appearing and in no acute distress. Eyes: Conjunctivae are normal.EOMI. Nose: No congestion/rhinnorhea. Mouth/Throat: Mucous membranes are moist.   Neck: No stridor. Cardiovascular: Good peripheral circulation. Respiratory: Normal respiratory effort.  No retractions. Musculoskeletal: FROM throughout. Neurologic:  Normal speech and language. No gross focal neurologic deficits are appreciated. Skin:  Approximately 5 cm area of erythema noted to the left lateral scapula. The area is mildly indurated. There is no fluctuance noted. There is a central pinpoint darkened area consistent with insect bite.  ____________________________________________   LABS (all labs ordered are listed, but only abnormal results are displayed)  Labs Reviewed - No data to display ____________________________________________  EKG   ____________________________________________  RADIOLOGY   ____________________________________________   PROCEDURES  Procedure(s) performed: None ____________________________________________   INITIAL IMPRESSION / ASSESSMENT AND PLAN / ED COURSE  Pertinent labs & imaging results that were available during my care of the patient were reviewed by me and considered in my medical decision making (see chart  for details).  Patient will be advised to follow-up with primary care provider for symptoms that are not improving over the next 2-3 days. She was advised to take the doxycycline and Naprosyn as prescribed. She was encouraged to return to the emergency department for symptoms change or worsen if unable schedule an appointment. ____________________________________________   FINAL CLINICAL IMPRESSION(S) / ED DIAGNOSES  Final diagnoses:  Cellulitis of other specified site       Victorino Dike, FNP 04/06/16 1901  Lavonia Drafts, MD 04/06/16 1949

## 2016-04-06 NOTE — ED Notes (Signed)
Pt has abscessed area to  Left shoulder.

## 2016-04-06 NOTE — Discharge Instructions (Signed)

## 2016-04-07 ENCOUNTER — Ambulatory Visit (INDEPENDENT_AMBULATORY_CARE_PROVIDER_SITE_OTHER): Payer: 59 | Admitting: Family Medicine

## 2016-04-07 ENCOUNTER — Encounter: Payer: Self-pay | Admitting: Family Medicine

## 2016-04-07 VITALS — BP 116/72 | HR 84 | Temp 98.3°F | Wt 192.5 lb

## 2016-04-07 DIAGNOSIS — L0390 Cellulitis, unspecified: Secondary | ICD-10-CM | POA: Diagnosis not present

## 2016-04-07 DIAGNOSIS — L0291 Cutaneous abscess, unspecified: Secondary | ICD-10-CM | POA: Diagnosis not present

## 2016-04-07 MED ORDER — CEFTRIAXONE SODIUM 1 G IJ SOLR
1.0000 g | Freq: Once | INTRAMUSCULAR | Status: AC
  Administered 2016-04-07: 1 g via INTRAMUSCULAR

## 2016-04-07 NOTE — Patient Instructions (Signed)
Good to see you. Please follow up with Dr. Diona Browner later this week.

## 2016-04-07 NOTE — Progress Notes (Signed)
Pre visit review using our clinic review tool, if applicable. No additional management support is needed unless otherwise documented below in the visit note. 

## 2016-04-07 NOTE — Assessment & Plan Note (Signed)
Given IM rocephin (per pt, can take despite augmentin allergy) for double coverage and because she has been vomiting. Continue doxycyline. Follow up with PCP later this week, sooner if no improvement as she may require I and D. The patient indicates understanding of these issues and agrees with the plan.

## 2016-04-07 NOTE — Addendum Note (Signed)
Addended by: Modena Nunnery on: 04/07/2016 09:33 AM   Modules accepted: Orders

## 2016-04-07 NOTE — Progress Notes (Signed)
Subjective:   Patient ID: Lacey Decker, female    DOB: [birth year], 45 y.o.   MRN: [id number]  Lacey Decker is a pleasant 45 y.o. year old female pt of Dr. Diona Browner,  who presents to clinic today with ER follow up/ Insect Bite  on 04/07/2016  HPI:  Was seen in ER yesterday, 04/06/16. Note reviewed.  Was at [place] at the beach last [place] and felt something bite her shoulder in the middle of the night.  The area became increasingly warm and painful 24 hours before she presented to the ER.  No labs or studies done.  Given doxycyline 100 mg twice daily and advised to follow up with PCP.  She has a history of significant anxiety and reports being here because "I want to make sure its not spreading into my blood." She feels symptoms are not improving. Vomited up doxycyline this morning but did take it on an empty stomach.  Current Outpatient Prescriptions on File Prior to Visit  Medication Sig Dispense Refill  . cetirizine (ZYRTEC) 10 MG tablet Take 10 mg by mouth daily.    . cyclobenzaprine (FLEXERIL) 10 MG tablet Take 0.5-1 tablets (5-10 mg total) by mouth 2 (two) times daily as needed for muscle spasms. 30 tablet 0  . doxycycline (VIBRAMYCIN) 100 MG capsule Take 1 capsule (100 mg total) by mouth 2 (two) times daily. 20 capsule 0  . fluconazole (DIFLUCAN) 150 MG tablet Take 1 tablet (150 mg total) by mouth once. 1 tablet 0  . fluticasone (FLONASE) 50 MCG/ACT nasal spray Place 2 sprays into both nostrils daily. 48 g 3  . hydrochlorothiazide (MICROZIDE) 12.5 MG capsule Take 1 capsule (12.5 mg total) by mouth daily as needed (swelling). 30 capsule 1  . naproxen (NAPROSYN) 500 MG tablet Take 1 tablet (500 mg total) by mouth 2 (two) times daily with a meal. 60 tablet 0  . omeprazole (PRILOSEC) 40 MG capsule Take 40 mg by mouth daily.     No current facility-administered medications on file prior to visit.    Allergies  Allergen Reactions  . Codeine     REACTION: anaphylactic shock   . Morphine     REACTION: swelling  . Augmentin [Amoxicillin-Pot Clavulanate]   . Clarithromycin     REACTION: Trouble breathing - stomach upset - bad taste in mouth  . Codeine Phosphate     REACTION: unspecified  . Erythromycin Base Other (See Comments)    Eye ointment - worsened conjunctivitis and swelling  . Morphine Sulfate     REACTION: unspecified  . Sulfamethoxazole-Trimethoprim     REACTION: N \\T \ V, rash    Past Medical History  Diagnosis Date  . Chlamydia     as a teen  . Anxiety   . Bipolar 1 disorder ([place])   . MRSA (methicillin resistant staph aureus) culture positive   . Alcohol abuse     last drink one year ago    Past Surgical History  Procedure Laterality Date  . Btl  11/09/1998  . Leep  2000  . Dilation and curettage of uterus  1997    after miscarriage  . Misscarriage      x5  . Nsvd      3  . Neuroma surgery      lipoma removal Right labia    Family History  Problem Relation Age of Onset  . Hypertension Mother   . Hyperlipidemia Mother   . Depression Mother   . Bipolar  disorder Sister   . Depression Maternal Grandmother     Social History   Social History  . Marital Status: Married    Spouse Name: N/A  . Number of Children: 3  . Years of Education: N/A   Occupational History  .  Lab [employer]   Social History Main Topics  . Smoking status: Never Smoker   . Smokeless tobacco: Never Used  . Alcohol Use: No  . Drug Use: No  . Sexual Activity: Not on file   Other Topics Concern  . Not on file   Social History Narrative   The PMH, PSH, Social History, Family History, Medications, and allergies have been reviewed in [facility], and have been updated if relevant.      Review of Systems  Constitutional: Positive for fever and chills.  Gastrointestinal: Positive for nausea and vomiting.  Skin: Positive for rash.  All other systems reviewed and are negative.      Objective:    BP 116/72 mmHg  Pulse 84  Temp(Src) 98.3 F (36.8 C)  (Oral)  Wt 192 lb 8 oz (87.317 kg)  SpO2 99%   Physical Exam  Constitutional: She is oriented to person, place, and time. She appears well-developed and well-nourished. No distress.  HENT:  Head: Normocephalic.  Eyes: Conjunctivae are normal.  Cardiovascular: Normal rate.   Pulmonary/Chest: Effort normal.  Musculoskeletal: Normal range of motion.  Neurological: She is alert and oriented to person, place, and time.  Skin: She is not diaphoretic.     Psychiatric: She has a normal mood and affect. Her behavior is normal. Judgment and thought content normal.  Nursing note and vitals reviewed.         Assessment & Plan:   No diagnosis found. No Follow-up on file.

## 2016-05-05 ENCOUNTER — Ambulatory Visit (INDEPENDENT_AMBULATORY_CARE_PROVIDER_SITE_OTHER): Payer: 59 | Admitting: Family

## 2016-05-05 ENCOUNTER — Encounter: Payer: Self-pay | Admitting: Family

## 2016-05-05 ENCOUNTER — Ambulatory Visit: Payer: Self-pay | Admitting: Primary Care

## 2016-05-05 VITALS — BP 120/78 | HR 67 | Temp 98.7°F | Wt 197.6 lb

## 2016-05-05 DIAGNOSIS — L298 Other pruritus: Secondary | ICD-10-CM | POA: Diagnosis not present

## 2016-05-05 DIAGNOSIS — J0100 Acute maxillary sinusitis, unspecified: Secondary | ICD-10-CM | POA: Diagnosis not present

## 2016-05-05 DIAGNOSIS — N898 Other specified noninflammatory disorders of vagina: Secondary | ICD-10-CM

## 2016-05-05 DIAGNOSIS — M542 Cervicalgia: Secondary | ICD-10-CM | POA: Diagnosis not present

## 2016-05-05 MED ORDER — FLUCONAZOLE 150 MG PO TABS: 150.0000 mg | ORAL_TABLET | Freq: Once | ORAL | Status: DC

## 2016-05-05 MED ORDER — DICLOFENAC SODIUM 1 % TD GEL: 4.0000 g | Freq: Four times a day (QID) | TRANSDERMAL | Status: DC

## 2016-05-05 MED ORDER — AMOXICILLIN 500 MG PO CAPS: 500.0000 mg | ORAL_CAPSULE | Freq: Two times a day (BID) | ORAL | Status: DC

## 2016-05-05 NOTE — Patient Instructions (Addendum)
Heat. Gentle stretching for neck.   Focus on stress management and please call behavioral health for therapy.  Let's treat conservatively as we discussed.   Over-the-counter medications you may try for neck pain include:   ThermaCare patches   Capsaicin cream     Home exercises as below.   If conservative treatment doesn't yield results, we will consider physical therapy, consult to Sports Therapy, and imaging.   If there is no improvement in your symptoms, or if there is any worsening of symptoms, or if you have any additional concerns, please return for re-evaluation; or, if we are closed, consider going to the Emergency Room for evaluation if symptoms urgent.   Cervical Strain and Sprain With Rehab Cervical strain and sprain are injuries that commonly occur with "whiplash" injuries. Whiplash occurs when the neck is forcefully whipped backward or forward, such as during a motor vehicle accident or during contact sports. The muscles, ligaments, tendons, discs, and nerves of the neck are susceptible to injury when this occurs. RISK FACTORS Risk of having a whiplash injury increases if:  Osteoarthritis of the spine.  Situations that make head or neck accidents or trauma more likely.  High-risk sports (football, rugby, wrestling, hockey, auto racing, gymnastics, diving, contact karate, or boxing).  Poor strength and flexibility of the neck.  Previous neck injury.  Poor tackling technique.  Improperly fitted or padded equipment. SYMPTOMS   Pain or stiffness in the front or back of neck or both.  Symptoms may present immediately or up to 24 hours after injury.  Dizziness, headache, nausea, and vomiting.  Muscle spasm with soreness and stiffness in the neck.  Tenderness and swelling at the injury site. PREVENTION  Learn and use proper technique (avoid tackling with the head, spearing, and head-butting; use proper falling techniques to avoid landing on the head).  Warm  up and stretch properly before activity.  Maintain physical fitness:  Strength, flexibility, and endurance.  Cardiovascular fitness.  Wear properly fitted and padded protective equipment, such as padded soft collars, for participation in contact sports. PROGNOSIS  Recovery from cervical strain and sprain injuries is dependent on the extent of the injury. These injuries are usually curable in 1 week to 3 months with appropriate treatment.  RELATED COMPLICATIONS   Temporary numbness and weakness may occur if the nerve roots are damaged, and this may persist until the nerve has completely healed.  Chronic pain due to frequent recurrence of symptoms.  Prolonged healing, especially if activity is resumed too soon (before complete recovery). TREATMENT  Treatment initially involves the use of ice and medication to help reduce pain and inflammation. It is also important to perform strengthening and stretching exercises and modify activities that worsen symptoms so the injury does not get worse. These exercises may be performed at home or with a therapist. For patients who experience severe symptoms, a soft, padded collar may be recommended to be worn around the neck.  Improving your posture may help reduce symptoms. Posture improvement includes pulling your chin and abdomen in while sitting or standing. If you are sitting, sit in a firm chair with your buttocks against the back of the chair. While sleeping, try replacing your pillow with a small towel rolled to 2 inches in diameter, or use a cervical pillow or soft cervical collar. Poor sleeping positions delay healing.  For patients with nerve root damage, which causes numbness or weakness, the use of a cervical traction apparatus may be recommended. Surgery is rarely necessary for  these injuries. However, cervical strain and sprains that are present at birth (congenital) may require surgery. MEDICATION   If pain medication is necessary,  nonsteroidal anti-inflammatory medications, such as aspirin and ibuprofen, or other minor pain relievers, such as acetaminophen, are often recommended.  Do not take pain medication for 7 days before surgery.  Prescription pain relievers may be given if deemed necessary by your caregiver. Use only as directed and only as much as you need. HEAT AND COLD:   Cold treatment (icing) relieves pain and reduces inflammation. Cold treatment should be applied for 10 to 15 minutes every 2 to 3 hours for inflammation and pain and immediately after any activity that aggravates your symptoms. Use ice packs or an ice massage.  Heat treatment may be used prior to performing the stretching and strengthening activities prescribed by your caregiver, physical therapist, or athletic trainer. Use a heat pack or a warm soak. SEEK MEDICAL CARE IF:   Symptoms get worse or do not improve in 2 weeks despite treatment.  New, unexplained symptoms develop (drugs used in treatment may produce side effects). EXERCISES RANGE OF MOTION (ROM) AND STRETCHING EXERCISES - Cervical Strain and Sprain These exercises may help you when beginning to rehabilitate your injury. In order to successfully resolve your symptoms, you must improve your posture. These exercises are designed to help reduce the forward-head and rounded-shoulder posture which contributes to this condition. Your symptoms may resolve with or without further involvement from your physician, physical therapist or athletic trainer. While completing these exercises, remember:   Restoring tissue flexibility helps normal motion to return to the joints. This allows healthier, less painful movement and activity.  An effective stretch should be held for at least 20 seconds, although you may need to begin with shorter hold times for comfort.  A stretch should never be painful. You should only feel a gentle lengthening or release in the stretched tissue. STRETCH- Axial  Extensors  Lie on your back on the floor. You may bend your knees for comfort. Place a rolled-up hand towel or dish towel, about 2 inches in diameter, under the part of your head that makes contact with the floor.  Gently tuck your chin, as if trying to make a "double chin," until you feel a gentle stretch at the base of your head.  Hold __________ seconds. Repeat __________ times. Complete this exercise __________ times per day.  STRETCH - Axial Extension   Stand or sit on a firm surface. Assume a good posture: chest up, shoulders drawn back, abdominal muscles slightly tense, knees unlocked (if standing) and feet hip width apart.  Slowly retract your chin so your head slides back and your chin slightly lowers. Continue to look straight ahead.  You should feel a gentle stretch in the back of your head. Be certain not to feel an aggressive stretch since this can cause headaches later.  Hold for __________ seconds. Repeat __________ times. Complete this exercise __________ times per day. STRETCH - Cervical Side Bend   Stand or sit on a firm surface. Assume a good posture: chest up, shoulders drawn back, abdominal muscles slightly tense, knees unlocked (if standing) and feet hip width apart.  Without letting your nose or shoulders move, slowly tip your right / left ear to your shoulder until your feel a gentle stretch in the muscles on the opposite side of your neck.  Hold __________ seconds. Repeat __________ times. Complete this exercise __________ times per day. STRETCH - Cervical Rotators  Stand or sit on a firm surface. Assume a good posture: chest up, shoulders drawn back, abdominal muscles slightly tense, knees unlocked (if standing) and feet hip width apart.  Keeping your eyes level with the ground, slowly turn your head until you feel a gentle stretch along the back and opposite side of your neck.  Hold __________ seconds. Repeat __________ times. Complete this exercise  __________ times per day. RANGE OF MOTION - Neck Circles   Stand or sit on a firm surface. Assume a good posture: chest up, shoulders drawn back, abdominal muscles slightly tense, knees unlocked (if standing) and feet hip width apart.  Gently roll your head down and around from the back of one shoulder to the back of the other. The motion should never be forced or painful.  Repeat the motion 10-20 times, or until you feel the neck muscles relax and loosen. Repeat __________ times. Complete the exercise __________ times per day. STRENGTHENING EXERCISES - Cervical Strain and Sprain These exercises may help you when beginning to rehabilitate your injury. They may resolve your symptoms with or without further involvement from your physician, physical therapist, or athletic trainer. While completing these exercises, remember:   Muscles can gain both the endurance and the strength needed for everyday activities through controlled exercises.  Complete these exercises as instructed by your physician, physical therapist, or athletic trainer. Progress the resistance and repetitions only as guided.  You may experience muscle soreness or fatigue, but the pain or discomfort you are trying to eliminate should never worsen during these exercises. If this pain does worsen, stop and make certain you are following the directions exactly. If the pain is still present after adjustments, discontinue the exercise until you can discuss the trouble with your clinician. STRENGTH - Cervical Flexors, Isometric  Face a wall, standing about 6 inches away. Place a small pillow, a ball about 6-8 inches in diameter, or a folded towel between your forehead and the wall.  Slightly tuck your chin and gently push your forehead into the soft object. Push only with mild to moderate intensity, building up tension gradually. Keep your jaw and forehead relaxed.  Hold 10 to 20 seconds. Keep your breathing relaxed.  Release the  tension slowly. Relax your neck muscles completely before you start the next repetition. Repeat __________ times. Complete this exercise __________ times per day. STRENGTH- Cervical Lateral Flexors, Isometric   Stand about 6 inches away from a wall. Place a small pillow, a ball about 6-8 inches in diameter, or a folded towel between the side of your head and the wall.  Slightly tuck your chin and gently tilt your head into the soft object. Push only with mild to moderate intensity, building up tension gradually. Keep your jaw and forehead relaxed.  Hold 10 to 20 seconds. Keep your breathing relaxed.  Release the tension slowly. Relax your neck muscles completely before you start the next repetition. Repeat __________ times. Complete this exercise __________ times per day. STRENGTH - Cervical Extensors, Isometric   Stand about 6 inches away from a wall. Place a small pillow, a ball about 6-8 inches in diameter, or a folded towel between the back of your head and the wall.  Slightly tuck your chin and gently tilt your head back into the soft object. Push only with mild to moderate intensity, building up tension gradually. Keep your jaw and forehead relaxed.  Hold 10 to 20 seconds. Keep your breathing relaxed.  Release the tension slowly. Relax your neck  muscles completely before you start the next repetition. Repeat __________ times. Complete this exercise __________ times per day. POSTURE AND BODY MECHANICS CONSIDERATIONS - Cervical Strain and Sprain Keeping correct posture when sitting, standing or completing your activities will reduce the stress put on different body tissues, allowing injured tissues a chance to heal and limiting painful experiences. The following are general guidelines for improved posture. Your physician or physical therapist will provide you with any instructions specific to your needs. While reading these guidelines, remember:  The exercises prescribed by your provider  will help you have the flexibility and strength to maintain correct postures.  The correct posture provides the optimal environment for your joints to work. All of your joints have less wear and tear when properly supported by a spine with good posture. This means you will experience a healthier, less painful body.  Correct posture must be practiced with all of your activities, especially prolonged sitting and standing. Correct posture is as important when doing repetitive low-stress activities (typing) as it is when doing a single heavy-load activity (lifting). PROLONGED STANDING WHILE SLIGHTLY LEANING FORWARD When completing a task that requires you to lean forward while standing in one place for a long time, place either foot up on a stationary 2- to 4-inch high object to help maintain the best posture. When both feet are on the ground, the low back tends to lose its slight inward curve. If this curve flattens (or becomes too large), then the back and your other joints will experience too much stress, fatigue more quickly, and can cause pain.  RESTING POSITIONS Consider which positions are most painful for you when choosing a resting position. If you have pain with flexion-based activities (sitting, bending, stooping, squatting), choose a position that allows you to rest in a less flexed posture. You would want to avoid curling into a fetal position on your side. If your pain worsens with extension-based activities (prolonged standing, working overhead), avoid resting in an extended position such as sleeping on your stomach. Most people will find more comfort when they rest with their spine in a more neutral position, neither too rounded nor too arched. Lying on a non-sagging bed on your side with a pillow between your knees, or on your back with a pillow under your knees will often provide some relief. Keep in mind, being in any one position for a prolonged period of time, no matter how correct your  posture, can still lead to stiffness. WALKING Walk with an upright posture. Your ears, shoulders, and hips should all line up. OFFICE WORK When working at a desk, create an environment that supports good, upright posture. Without extra support, muscles fatigue and lead to excessive strain on joints and other tissues. CHAIR:  A chair should be able to slide under your desk when your back makes contact with the back of the chair. This allows you to work closely.  The chair's height should allow your eyes to be level with the upper part of your monitor and your hands to be slightly lower than your elbows.  Body position:  Your feet should make contact with the floor. If this is not possible, use a foot rest.  Keep your ears over your shoulders. This will reduce stress on your neck and low back.   This information is not intended to replace advice given to you by your health care provider. Make sure you discuss any questions you have with your health care provider.   Document Released:  10/26/2005 Document Revised: 11/16/2014 Document Reviewed: 02/07/2009 Elsevier Interactive Patient Education [employer].

## 2016-05-05 NOTE — Progress Notes (Signed)
Subjective:    Patient ID: Lacey Decker, female    DOB: 1971/10/21, 45 y.o.   MRN: [id number]   Lacey Decker is a 45 y.o. female who presents today for an acute visit.    HPI Comments: Patient here for multiple complaints.  Patient here for evaluation of sinus congestion, ear pain and pressure one week ago. Endorses ear pressure, cough. Zrytec, Flonase, saline with some relief. No HA, changes in vision, dizziness.   Patient also here for reevaluation of right shoulder pain and neck for years, comes and goes. Flared one week ago. 'Spells come for 10 days'. Tearful and endorses stress right now.  Icey Hot does work. Flexeril with some relief however states its sedating. Heat relieves pain. Concern for bursitis and athritis. No injury. No numnbess and tingling, or right arm weakness.   Complains of vaginal itching after doxycycline ; requesting medication for yeast infection.   Past Medical History  Diagnosis Date  . Chlamydia     as a teen  . Anxiety   . Bipolar 1 disorder ([place])   . MRSA (methicillin resistant staph aureus) culture positive   . Alcohol abuse     last drink one year ago   Allergies: Codeine; Morphine; Augmentin; Clarithromycin; Codeine phosphate; Erythromycin base; Morphine sulfate; and Sulfamethoxazole-trimethoprim Current Outpatient Prescriptions on File Prior to Visit  Medication Sig Dispense Refill  . cetirizine (ZYRTEC) 10 MG tablet Take 10 mg by mouth daily.    . fluticasone (FLONASE) 50 MCG/ACT nasal spray Place 2 sprays into both nostrils daily. 48 g 3  . hydrochlorothiazide (MICROZIDE) 12.5 MG capsule Take 1 capsule (12.5 mg total) by mouth daily as needed (swelling). 30 capsule 1  . naproxen (NAPROSYN) 500 MG tablet Take 1 tablet (500 mg total) by mouth 2 (two) times daily with a meal. 60 tablet 0  . omeprazole (PRILOSEC) 40 MG capsule Take 40 mg by mouth daily.     No current facility-administered medications on file prior to visit.    Social  History  Substance Use Topics  . Smoking status: Never Smoker   . Smokeless tobacco: Never Used  . Alcohol Use: No    Review of Systems  Constitutional: Negative for fever, chills and unexpected weight change.  HENT: Positive for congestion, ear pain ('pressure') and sinus pressure. Negative for sore throat.   Respiratory: Positive for cough. Negative for shortness of breath and wheezing.   Cardiovascular: Negative for chest pain, palpitations and leg swelling.  Gastrointestinal: Negative for nausea and vomiting.  Musculoskeletal: Positive for neck pain. Negative for myalgias, back pain, arthralgias and neck stiffness.  Neurological: Negative for dizziness and headaches.      Objective:    BP 120/78 mmHg  Pulse 67  Temp(Src) 98.7 F (37.1 C)  Wt 197 lb 9.6 oz (89.631 kg)  SpO2 98%   Physical Exam  Constitutional: She appears well-developed and well-nourished.  HENT:  Head: Normocephalic and atraumatic.  Right Ear: Hearing, external ear and ear canal normal. No drainage, swelling or tenderness. No foreign bodies. Tympanic membrane is bulging. Tympanic membrane is not erythematous. No middle ear effusion. No decreased hearing is noted.  Left Ear: Hearing, external ear and ear canal normal. No drainage, swelling or tenderness. No foreign bodies. Tympanic membrane is bulging. Tympanic membrane is not erythematous.  No middle ear effusion. No decreased hearing is noted.  Nose: No rhinorrhea. Right sinus exhibits maxillary sinus tenderness. Right sinus exhibits no frontal sinus tenderness. Left sinus exhibits maxillary  sinus tenderness. Left sinus exhibits no frontal sinus tenderness.  Mouth/Throat: Uvula is midline, oropharynx is clear and moist and mucous membranes are normal. No oropharyngeal exudate, posterior oropharyngeal edema, posterior oropharyngeal erythema or tonsillar abscesses.  Eyes: Conjunctivae are normal.  Neck: Normal range of motion and full passive range of motion  without pain. Neck supple. Spinous process tenderness and muscular tenderness present. No rigidity. No edema, no erythema and normal range of motion present.    + tenderness as noted on diagram.   Cardiovascular: Regular rhythm, normal heart sounds and normal pulses.   Pulmonary/Chest: Effort normal and breath sounds normal. She has no wheezes. She has no rhonchi. She has no rales.  Musculoskeletal:       Right shoulder: She exhibits normal range of motion, no tenderness, no bony tenderness, no pain and no spasm.  Right Shoulder:   No asymmetry of shoulders when comparing right and left.No pain with palpation over glenohumeral joint lines, [place] joint, AC joint, or bicipital groove. No pain with internal and external rotation. No pain with resisted lateral extension .   Negative active painful arc sign. Negative passive arc ( Neer's). Negative drop arm.  Strength and sensation normal BUE's.   Lymphadenopathy:       Head (right side): No submental, no submandibular, no tonsillar, no preauricular, no posterior auricular and no occipital adenopathy present.       Head (left side): No submental, no submandibular, no tonsillar, no preauricular, no posterior auricular and no occipital adenopathy present.    She has no cervical adenopathy.  Neurological: She is alert.  Skin: Skin is warm and dry.  Psychiatric: She has a normal mood and affect. Her speech is normal and behavior is normal. Thought content normal.  Vitals reviewed.      Assessment & Plan:   1. Acute maxillary sinusitis, recurrence not specified  - amoxicillin (AMOXIL) 500 MG capsule; Take 1 capsule (500 mg total) by mouth 2 (two) times daily.  Dispense: 14 capsule; Refill: 0  2. Neck pain on right side No pain over cervical spine. No pain right shoulder joint itself. Jointly agreed to treat conservatively with topical cream and heat for muscle spasm.   - diclofenac sodium (VOLTAREN) 1 % GEL; Apply 4 g topically 4 (four) times  daily.  Dispense: 1 Tube; Refill: 3  3. Vaginal itching Suspect yeast infection after course of doxycycline. We jointly agreed to empirically treat.   - fluconazole (DIFLUCAN) 150 MG tablet; Take 1 tablet (150 mg total) by mouth once. Take one tablet PO once. If continue to have symptoms, may take one tablet PO 3 days later.  Dispense: 2 tablet; Refill: 1    I have discontinued Ms. Burdell's cyclobenzaprine, fluconazole, and doxycycline. I am also having her maintain her cetirizine, fluticasone, omeprazole, hydrochlorothiazide, and naproxen.   No orders of the defined types were placed in this encounter.     Start medications as prescribed and explained to patient on After Visit Summary ( AVS). Risks, benefits, and alternatives of the medications and treatment plan prescribed today were discussed, and patient expressed understanding.   Education regarding symptom management and diagnosis given to patient.   Follow-up:Plan follow-up and return precautions given if any worsening symptoms or change in condition.   Continue to follow with Eliezer Lofts, MD for routine health maintenance.   Lacey Decker and I agreed with plan.   Mable Paris, FNP

## 2016-05-06 ENCOUNTER — Ambulatory Visit: Payer: Self-pay | Admitting: Primary Care

## 2016-05-19 ENCOUNTER — Encounter: Payer: Self-pay | Admitting: Internal Medicine

## 2016-05-19 ENCOUNTER — Ambulatory Visit (INDEPENDENT_AMBULATORY_CARE_PROVIDER_SITE_OTHER): Payer: 59 | Admitting: Internal Medicine

## 2016-05-19 VITALS — BP 112/70 | HR 63 | Temp 98.1°F | Wt 192.0 lb

## 2016-05-19 DIAGNOSIS — J0101 Acute recurrent maxillary sinusitis: Secondary | ICD-10-CM | POA: Diagnosis not present

## 2016-05-19 MED ORDER — LEVOFLOXACIN 500 MG PO TABS: 500.0000 mg | ORAL_TABLET | Freq: Every day | ORAL | Status: DC

## 2016-05-19 MED ORDER — MONTELUKAST SODIUM 10 MG PO TABS: 10.0000 mg | ORAL_TABLET | Freq: Every day | ORAL | Status: DC

## 2016-05-19 MED ORDER — PREDNISONE 20 MG PO TABS: 40.0000 mg | ORAL_TABLET | Freq: Every day | ORAL | Status: DC

## 2016-05-19 NOTE — Patient Instructions (Signed)
Please get the fluticasone spray and cetirizine at [facility]

## 2016-05-19 NOTE — Progress Notes (Signed)
Pre visit review using our clinic review tool, if applicable. No additional management support is needed unless otherwise documented below in the visit note. 

## 2016-05-19 NOTE — Assessment & Plan Note (Signed)
Recurrent after recent amoxil treatment Can't take augmentin Has done well with levaquin--will try that again Will add montelukast for the apparent sinus/allergy component

## 2016-05-19 NOTE — Progress Notes (Signed)
Subjective:    Patient ID: Lacey Decker, female    DOB: September 04, 1971, 45 y.o.   MRN: [id number]  HPI Here due to respiratory symptoms Did get some better with the amoxil but right ear never cleared up Pain, dizziness   Cough improved but back in past 2 days Voice gone Feels some trouble breathing---due to severe nasal congestion Felt congested in throat and needed to cough Does have some drainage--but cough is dry  No fever No chills or sweats  Is using the flonase, zyrtec Does feel like allergies are an issue with this---never better with this  Current Outpatient Prescriptions on File Prior to Visit  Medication Sig Dispense Refill  . cetirizine (ZYRTEC) 10 MG tablet Take 10 mg by mouth daily.    . diclofenac sodium (VOLTAREN) 1 % GEL Apply 4 g topically 4 (four) times daily. 1 Tube 3  . fluticasone (FLONASE) 50 MCG/ACT nasal spray Place 2 sprays into both nostrils daily. 48 g 3  . omeprazole (PRILOSEC) 40 MG capsule Take 40 mg by mouth daily.    . hydrochlorothiazide (MICROZIDE) 12.5 MG capsule Take 1 capsule (12.5 mg total) by mouth daily as needed (swelling). (Patient not taking: Reported on 05/19/2016) 30 capsule 1  . naproxen (NAPROSYN) 500 MG tablet Take 1 tablet (500 mg total) by mouth 2 (two) times daily with a meal. (Patient not taking: Reported on 05/19/2016) 60 tablet 0   No current facility-administered medications on file prior to visit.    Allergies  Allergen Reactions  . Codeine     REACTION: anaphylactic shock  . Morphine     REACTION: swelling  . Augmentin [Amoxicillin-Pot Clavulanate]   . Clarithromycin     REACTION: Trouble breathing - stomach upset - bad taste in mouth  . Codeine Phosphate     REACTION: unspecified  . Erythromycin Base Other (See Comments)    Eye ointment - worsened conjunctivitis and swelling  . Morphine Sulfate     REACTION: unspecified  . Sulfamethoxazole-Trimethoprim     REACTION: N \\T \ V, rash    Past Medical History    Diagnosis Date  . Chlamydia     as a teen  . Anxiety   . Bipolar 1 disorder ([place])   . MRSA (methicillin resistant staph aureus) culture positive   . Alcohol abuse     last drink one year ago    Past Surgical History  Procedure Laterality Date  . Btl  11/09/1998  . Leep  2000  . Dilation and curettage of uterus  1997    after miscarriage  . Misscarriage      x5  . Nsvd      3  . Neuroma surgery      lipoma removal Right labia    Family History  Problem Relation Age of Onset  . Hypertension Mother   . Hyperlipidemia Mother   . Depression Mother   . Bipolar disorder Sister   . Depression Maternal Grandmother     Social History   Social History  . Marital Status: Married    Spouse Name: N/A  . Number of Children: 3  . Years of Education: N/A   Occupational History  .  Lab [employer]   Social History Main Topics  . Smoking status: Never Smoker   . Smokeless tobacco: Never Used  . Alcohol Use: No  . Drug Use: No  . Sexual Activity: Not on file   Other Topics Concern  . Not on file  Social History Narrative   Review of Systems No vomiting or diarrhea  Appetite is okay till today    Objective:   Physical Exam  Constitutional: She appears well-developed. No distress.  Mildly uncomfortable  HENT:  Mouth/Throat: Oropharynx is clear and moist. No oropharyngeal exudate.  Moderate maxillary sinusitis Moderate nasal inflammation ?slight retraction of right TM but no inflammation  Neck: Normal range of motion. Neck supple.  Pulmonary/Chest: Effort normal and breath sounds normal. No respiratory distress. She has no wheezes. She has no rales.  Lymphadenopathy:    She has no cervical adenopathy.          Assessment & Plan:

## 2016-08-05 ENCOUNTER — Ambulatory Visit (INDEPENDENT_AMBULATORY_CARE_PROVIDER_SITE_OTHER): Payer: 59 | Admitting: Psychology

## 2016-08-05 DIAGNOSIS — F4310 Post-traumatic stress disorder, unspecified: Secondary | ICD-10-CM

## 2016-08-05 DIAGNOSIS — F319 Bipolar disorder, unspecified: Secondary | ICD-10-CM | POA: Diagnosis not present

## 2016-08-12 ENCOUNTER — Ambulatory Visit (INDEPENDENT_AMBULATORY_CARE_PROVIDER_SITE_OTHER): Payer: 59 | Admitting: Psychology

## 2016-08-12 DIAGNOSIS — F319 Bipolar disorder, unspecified: Secondary | ICD-10-CM

## 2016-08-12 DIAGNOSIS — F4310 Post-traumatic stress disorder, unspecified: Secondary | ICD-10-CM | POA: Diagnosis not present

## 2016-08-19 ENCOUNTER — Ambulatory Visit (INDEPENDENT_AMBULATORY_CARE_PROVIDER_SITE_OTHER): Payer: 59 | Admitting: Psychology

## 2016-08-19 DIAGNOSIS — F319 Bipolar disorder, unspecified: Secondary | ICD-10-CM

## 2016-08-19 DIAGNOSIS — F4310 Post-traumatic stress disorder, unspecified: Secondary | ICD-10-CM

## 2016-09-09 ENCOUNTER — Ambulatory Visit (INDEPENDENT_AMBULATORY_CARE_PROVIDER_SITE_OTHER): Payer: 59 | Admitting: Psychology

## 2016-09-09 DIAGNOSIS — F4310 Post-traumatic stress disorder, unspecified: Secondary | ICD-10-CM | POA: Diagnosis not present

## 2016-09-09 DIAGNOSIS — F319 Bipolar disorder, unspecified: Secondary | ICD-10-CM | POA: Diagnosis not present

## 2016-10-15 ENCOUNTER — Ambulatory Visit (INDEPENDENT_AMBULATORY_CARE_PROVIDER_SITE_OTHER): Payer: 59 | Admitting: Family Medicine

## 2016-10-15 ENCOUNTER — Encounter: Payer: Self-pay | Admitting: Family Medicine

## 2016-10-15 VITALS — BP 100/68 | HR 67 | Temp 98.4°F | Ht 65.0 in | Wt 189.0 lb

## 2016-10-15 DIAGNOSIS — M25521 Pain in right elbow: Secondary | ICD-10-CM | POA: Insufficient documentation

## 2016-10-15 DIAGNOSIS — Z23 Encounter for immunization: Secondary | ICD-10-CM | POA: Diagnosis not present

## 2016-10-15 MED ORDER — FLUTICASONE PROPIONATE 50 MCG/ACT NA SUSP: 2.0000 | Freq: Every day | NASAL | 11 refills | Status: DC

## 2016-10-15 MED ORDER — DICLOFENAC SODIUM 75 MG PO TBEC: 75.0000 mg | DELAYED_RELEASE_TABLET | Freq: Two times a day (BID) | ORAL | 0 refills | Status: DC
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
## 2016-10-15 NOTE — Progress Notes (Signed)
Pre visit review using our clinic review tool, if applicable. No additional management support is needed unless otherwise documented below in the visit note. 

## 2016-10-15 NOTE — Patient Instructions (Signed)
Start home PT.  Start dicofenac twice daily for 2 week.  Can ice tender areas.  Stop any repetitive arm motion, heavy lifting > 10 lbs

## 2016-10-15 NOTE — Assessment & Plan Note (Signed)
Most consistent with lateral epicondylitis, but some evidence of medial epicondylitis as well. Treat with NSAIDs ice and home PT. Follow up in 2 weeks if not improving.

## 2016-10-21 ENCOUNTER — Ambulatory Visit: Payer: Self-pay | Admitting: Psychology

## 2016-10-28 ENCOUNTER — Telehealth: Payer: Self-pay

## 2016-10-28 ENCOUNTER — Ambulatory Visit (INDEPENDENT_AMBULATORY_CARE_PROVIDER_SITE_OTHER): Payer: 59 | Admitting: Psychology

## 2016-10-28 DIAGNOSIS — F319 Bipolar disorder, unspecified: Secondary | ICD-10-CM

## 2016-10-28 DIAGNOSIS — F4310 Post-traumatic stress disorder, unspecified: Secondary | ICD-10-CM | POA: Diagnosis not present

## 2016-10-28 NOTE — Telephone Encounter (Signed)
Pt was seen earlier today by Dr Rexene Edison; Pt is bipolar and has a fear of taking medications; pt has not taken med for bipolar in 4 years. Pt is now thinking she should be taking med. pt has been advised by Dr Diona Browner before to go to [facility] 3 days in a row and take her med their while being seen so that will take away some of fear of taking med and med causing pt to have an adverse reaction. Pt is afraid she will have a stroke, heart attack or something bad will happen. Pt said cost twice as much to go to UC instead of coming to PCP. Pt is thinking about trying this beginning of 2018. Pt request cb to see if Dr Diona Browner will see pt for 3 visits to make sure she is OK taking a new med. Pt knows Dr Diona Browner is not in office today.Please advise.

## 2016-10-29 NOTE — Telephone Encounter (Signed)
She said she does have a psychiatrist and she will be setting up an appt with them at the beginning of the year.

## 2016-10-29 NOTE — Telephone Encounter (Signed)
I would be happy to follow pt for several visits after starting a bipolar med, but the bipolar med should be prescribed by psychiatrist... Is she currently seeing one? Needs to set up.

## 2016-11-17 ENCOUNTER — Telehealth: Payer: Self-pay | Admitting: Family Medicine

## 2016-11-17 ENCOUNTER — Other Ambulatory Visit: Payer: Self-pay | Admitting: Family Medicine

## 2016-11-17 NOTE — Telephone Encounter (Signed)
Okay to refill diclofenac as requested.

## 2016-11-17 NOTE — Telephone Encounter (Signed)
Last office visit 10/15/2016.  Last refilled 10/15/2016 for #30 with no refills.  Ok to refill?

## 2016-11-17 NOTE — Telephone Encounter (Signed)
Patient Name: Lacey Decker  DOB: 1971-01-07    Initial Comment Caller states she has arm pain. She didn't take the medication as directed, still can't lift her arm up.    Nurse Assessment  Nurse: Thad Ranger RN, Denise Date/Time (Eastern Time): 11/17/2016 2:22:08 PM  Confirm and document reason for call. If symptomatic, describe symptoms. ---Caller states she has arm pain. She didn't take the medication (diclofenac) as directed, still can't lift her arm up. Seen by MD 1 mo ago.  Does the patient have any new or worsening symptoms? ---Yes  Will a triage be completed? ---Yes  Related visit to physician within the last 2 weeks? ---No  Does the PT have any chronic conditions? (i.e. diabetes, asthma, etc.) ---No  Is the patient pregnant or possibly pregnant? (Ask all females between the ages of 41-55) ---No  Is this a behavioral health or substance abuse call? ---No     Guidelines    Guideline Title Affirmed Question Affirmed Notes  Arm Pain [1] MODERATE pain (e.g., interferes with normal activities) AND [2] present > 3 days    Final Disposition User   See PCP When Office is Open (within 3 days) Carmon, RN, Langley Gauss    Comments  Pt refused to be eval by an MD and wants a refill on the antinflammatory the MD ordered prev. Based on RN clinical knowledge, advised to use ice or heat on her arm x 20 mins qid or prn, elevate the arm above heart, take NSAIDs per pkg inst, 600mg  po q 8 hrs prn.  Trans to [facility] for a refill on Diclofenac   Referrals  GO TO FACILITY REFUSED   Disagree/Comply: Disagree  Disagree/Comply Reason: Wait and see

## 2016-11-18 ENCOUNTER — Ambulatory Visit: Payer: 59 | Admitting: Psychology

## 2016-11-18 MED ORDER — DICLOFENAC SODIUM 75 MG PO TBEC: 75.0000 mg | DELAYED_RELEASE_TABLET | Freq: Two times a day (BID) | ORAL | 0 refills | Status: DC

## 2016-11-18 NOTE — Telephone Encounter (Signed)
Lacey Decker notified that a refill for diclofenac has been sent into her pharmacy.

## 2016-12-03 ENCOUNTER — Other Ambulatory Visit: Payer: Self-pay | Admitting: Obstetrics and Gynecology

## 2016-12-03 DIAGNOSIS — Z1231 Encounter for screening mammogram for malignant neoplasm of breast: Secondary | ICD-10-CM

## 2016-12-29 ENCOUNTER — Ambulatory Visit
Admission: RE | Admit: 2016-12-29 | Discharge: 2016-12-29 | Disposition: A | Discharge status: Home / Self Care | Payer: 59 | Source: Ambulatory Visit | Attending: Obstetrics and Gynecology | Admitting: Obstetrics and Gynecology

## 2016-12-29 DIAGNOSIS — Z1231 Encounter for screening mammogram for malignant neoplasm of breast: Secondary | ICD-10-CM | POA: Insufficient documentation

## 2017-01-04 ENCOUNTER — Encounter: Payer: Self-pay | Admitting: Family Medicine

## 2017-01-04 ENCOUNTER — Ambulatory Visit (INDEPENDENT_AMBULATORY_CARE_PROVIDER_SITE_OTHER): Payer: 59 | Admitting: Family Medicine

## 2017-01-04 VITALS — BP 100/66 | HR 73 | Temp 98.7°F | Ht 65.0 in | Wt 190.2 lb

## 2017-01-04 DIAGNOSIS — M7711 Lateral epicondylitis, right elbow: Secondary | ICD-10-CM

## 2017-01-04 DIAGNOSIS — M7701 Medial epicondylitis, right elbow: Secondary | ICD-10-CM | POA: Diagnosis not present

## 2017-01-04 DIAGNOSIS — M25521 Pain in right elbow: Secondary | ICD-10-CM | POA: Diagnosis not present

## 2017-01-04 MED ORDER — METHYLPREDNISOLONE ACETATE 40 MG/ML IJ SUSP
20.0000 mg | Freq: Once | INTRAMUSCULAR | Status: AC
Start: 2017-01-04 — End: 2017-01-04
  Administered 2017-01-04: 20 mg via INTRA_ARTICULAR

## 2017-01-04 NOTE — Progress Notes (Signed)
Dr. Frederico Hamman T. Korryn Pancoast, MD, [facility] [place], [address] Phone: [phone] Fax: [phone]  01/04/2017  Patient: Lacey Decker, MRN: [id number], DOB: 22-Jun-1971, 46 y.o.  Primary Physician:  Eliezer Lofts, MD   Chief Complaint  Patient presents with  . Elbow Pain    Right   Subjective:   Lacey Decker presents with lateral elbow pain.  Length of symptoms: 5 mo Hand effected: R  Patient describes a dull ache on the lateral elbow. There is some translation in the proximal forearm and in the distal upper arm. It is painful to lift with the hand facing down and to lift with the thumb in an upright position. Supination is painful. Patient points to the lateral epicondyle as the point of maximal tenderness near ECRB.  No trauma.   No prior fractures or operative interventions in the effective hand. Prior PT or HEP: none  Denies numbness or tingling. No significant neck or shoulder pain.  Hand of dominance: R Repetitive motion at work  The PMH, PSH, Social History, Family History, Medications, and allergies have been reviewed in [facility], and have been updated if relevant.  Patient Active Problem List   Diagnosis Date Noted  . Right elbow pain 10/15/2016  . Cellulitis and abscess 04/07/2016  . Pedal edema 08/12/2015  . Urinary frequency 12/12/2014  . Acute sinus infection 06/28/2014  . Abnormal findings on esophagogastroduodenoscopy (EGD) 10/27/2013  . Hot flashes 08/22/2013  . Other malaise and fatigue 08/22/2013  . Neck pain on right side 07/17/2013  . Medial epicondylitis of left elbow 07/17/2013  . Vaginitis and vulvovaginitis 06/30/2013  . Right sided abdominal pain 01/02/2013  . Right upper quadrant pain 01/08/2012  . OBESITY, UNSPECIFIED 11/21/2010  . ALLERGIC RHINITIS 11/21/2010  . MIGRAINE, COMMON, INTRACTABLE 01/29/2009  . MRSA 11/24/2008  . VERTEBROBASILAR INSUFFICIENCY 07/08/2007  . DSORD  [place], MOST RECENT EPSD 01/26/2007  . Generalized anxiety disorder 01/06/2007  . PANIC ATTACKS 01/06/2007  . Esophageal reflux 01/06/2007  . DYSFUNCTIONAL UTERINE BLEEDING 01/06/2007  . ROSACEA 01/06/2007    Past Medical History:  Diagnosis Date  . Alcohol abuse    last drink one year ago  . Anxiety   . Bipolar 1 disorder ([place])   . Chlamydia    as a teen  . MRSA (methicillin resistant staph aureus) culture positive     Past Surgical History:  Procedure Laterality Date  . ABDOMINAL HYSTERECTOMY    . btl  11/09/1998  . DILATION AND CURETTAGE OF UTERUS  1997   after miscarriage  . LEEP  2000  . misscarriage     x5  . NEUROMA SURGERY     lipoma removal Right labia  . nsvd     3    Social History   Social History  . Marital status: Single    Spouse name: N/A  . Number of children: 3  . Years of education: N/A   Occupational History  .  Lab [employer]   Social History Main Topics  . Smoking status: Never Smoker  . Smokeless tobacco: Never Used  . Alcohol use No  . Drug use: No  . Sexual activity: Not on file   Other Topics Concern  . Not on file   Social History Narrative  . No narrative on file    Family History  Problem Relation Age of Onset  . Hypertension Mother   . Hyperlipidemia Mother   .  Depression Mother   . Bipolar disorder Sister   . Depression Maternal Grandmother   . Breast cancer Neg Hx     Allergies  Allergen Reactions  . Codeine     REACTION: anaphylactic shock  . Morphine     REACTION: swelling  . Augmentin [Amoxicillin-Pot Clavulanate]   . Clarithromycin     REACTION: Trouble breathing - stomach upset - bad taste in mouth  . Erythromycin Base Other (See Comments)    Eye ointment - worsened conjunctivitis and swelling  . Sulfamethoxazole-Trimethoprim     REACTION: N \\T \ V, rash    Medication list reviewed and updated in full in [place].  GEN: No fevers, chills. Nontoxic. Primarily MSK c/o today. MSK:  Detailed in the HPI GI: tolerating PO intake without difficulty Neuro: No numbness, parasthesias, or tingling associated. Otherwise the pertinent positives of the ROS are noted above.   Objective:   Blood pressure 100/66, pulse 73, temperature 98.7 F (37.1 C), temperature source Oral, height 5\' 5"  (1.651 m), weight 190 lb 4 oz (86.3 kg).  GEN: Well-developed,well-nourished,in no acute distress; alert,appropriate and cooperative throughout examination HEENT: Normocephalic and atraumatic without obvious abnormalities. Ears, externally no deformities PULM: Breathing comfortably in no respiratory distress EXT: No clubbing, cyanosis, or edema PSYCH: Normally interactive. Cooperative during the interview. Pleasant. Friendly and conversant. Not anxious or depressed appearing. Normal, full affect.  R elbow Ecchymosis or edema: neg ROM: full flexion, extension, pronation, supination Shoulder ROM: Full Flexion: 5/5 Extension: 5/5, PAINFUL Supination: 5/5, PAINFUL Pronation: 5/5 Wrist ext: 5/5 Wrist flexion: 5/5 No gross bony abnormality Varus and Valgus stress: stable ECRB tenderness: YES, TTP Medial epicondyle: NT Lateral epicondyle, resisted wrist extension from wrist full pronation and flexion: PAINFUL grip: 4/5  sensation intact Tinel's, Elbow: negative  Subjective:   Lateral epicondylitis of right elbow  Right elbow pain - Plan: methylPREDNISolone acetate (DEPO-MEDROL) injection 20 mg  Medial epicondylitis of elbow, right  Elbow anatomy was reviewed, and tendinopathy was explained.  Pt. given a formal rehab program from [facility] on elbow rehabiliation.  Series of concentric and eccentric exercises should be done starting with no weight, work up to 1 lb, hammer, etc.  Use counterforce strap if working or using hands.  Formal PT would be beneficial. Emphasized stretching an cross-friction massage Emphasized proper palms up lifting biomechanics to unload  ECRB  Lateral Epicondylitis Injection, R Verbal consent was obtained from the patient. Risks, benefits, and alternatives were discussed. Potential complications including loss of pigment, atrophy, and rare risk of infection were discussed. Prepped with Chloraprep and Ethyl Chloride used for anesthesia. Under sterile conditions, the patient was injected at the point of maximal tenderness at the ECRB tendon with 1/2 cc of Lidocaine 1% and 1/2 cc of Depo-Medrol 40 mg. Decreased pain after injection. No complications.  Needle size: 22 gauge 1 1/2 inch   Follow-up: 6 weeks, depending on sx  Signed,  Oviya Ammar T. Gertrude Tarbet, MD   Patient's Medications  New Prescriptions   No medications on file  Previous Medications   CETIRIZINE (ZYRTEC) 10 MG TABLET    Take 10 mg by mouth daily.   DICLOFENAC (VOLTAREN) 75 MG EC TABLET    Take 1 tablet (75 mg total) by mouth 2 (two) times daily.   DICLOFENAC SODIUM (VOLTAREN) 1 % GEL    Apply 4 g topically 4 (four) times daily.   FLUTICASONE (FLONASE) 50 MCG/ACT NASAL SPRAY    Place 2 sprays into both nostrils daily.   OMEPRAZOLE (  PRILOSEC) 40 MG CAPSULE    Take 40 mg by mouth daily.  Modified Medications   No medications on file  Discontinued Medications   No medications on file

## 2017-01-04 NOTE — Progress Notes (Signed)
Pre visit review using our clinic review tool, if applicable. No additional management support is needed unless otherwise documented below in the visit note. 

## 2017-01-05 ENCOUNTER — Telehealth: Payer: Self-pay

## 2017-01-05 MED ORDER — AMBULATORY NON FORMULARY MEDICATION: 0 refills | Status: DC

## 2017-01-05 NOTE — Telephone Encounter (Signed)
That is very reasonable. Can you print script when you have time and I can sign.  Dx: elbow pain LON: 99 years

## 2017-01-05 NOTE — Telephone Encounter (Signed)
Rx printed and placed in Dr. Copland's in box for signature. 

## 2017-01-05 NOTE — Telephone Encounter (Signed)
Pt left /vm; pt was seen 01/04/17 and pts employer said that if pt could get rx for ergonomic keyboard and mouse she could have one. Pt request rx and request cb when ready.

## 2017-01-06 NOTE — Telephone Encounter (Signed)
Yilin notified Rx for Ergonomic Keyboard and Mouse is ready to be picked up at the front desk.

## 2017-01-11 ENCOUNTER — Other Ambulatory Visit: Payer: Self-pay | Admitting: *Deleted

## 2017-01-11 ENCOUNTER — Inpatient Hospital Stay
Admission: RE | Admit: 2017-01-11 | Discharge: 2017-01-11 | Disposition: A | Discharge status: Home / Self Care | Payer: Self-pay | Source: Ambulatory Visit | Attending: *Deleted | Admitting: *Deleted

## 2017-01-11 DIAGNOSIS — Z1231 Encounter for screening mammogram for malignant neoplasm of breast: Secondary | ICD-10-CM

## 2017-01-12 ENCOUNTER — Encounter: Payer: Self-pay | Admitting: Obstetrics and Gynecology

## 2017-01-15 LAB — LIPID PANEL
Cholesterol: 176 mg/dL (ref 0–200)
HDL: 45 mg/dL (ref 35–70)
LDL Cholesterol: 113 mg/dL
LDl/HDL Ratio: 3.9
Triglycerides: 85 mg/dL (ref 40–160)

## 2017-01-15 LAB — BASIC METABOLIC PANEL: GLUCOSE: 90 mg/dL

## 2017-01-26 ENCOUNTER — Encounter: Payer: Self-pay | Admitting: Family Medicine

## 2017-06-02 IMAGING — MG MM DIGITAL SCREENING BILAT W/ TOMO W/ CAD
8 of 12 series · 8 of 28 positions shown · non-contrast
Comparison: Previous exam(s).

CLINICAL DATA: Screening.

EXAM:
2D DIGITAL SCREENING BILATERAL MAMMOGRAM WITH CAD AND ADJUNCT TOMO

[L MLO]
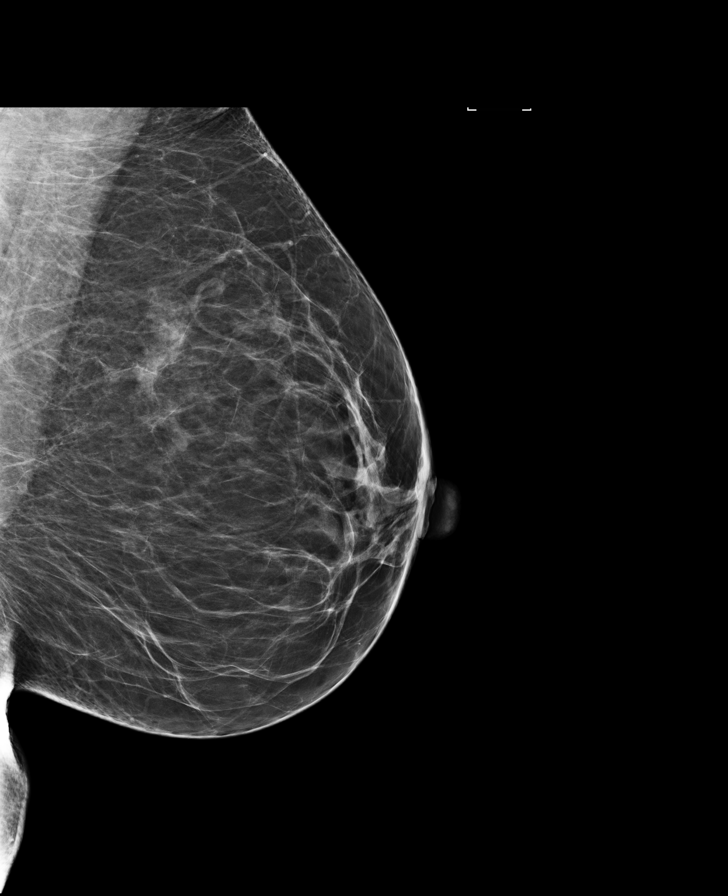

[L MLO synth-2D]
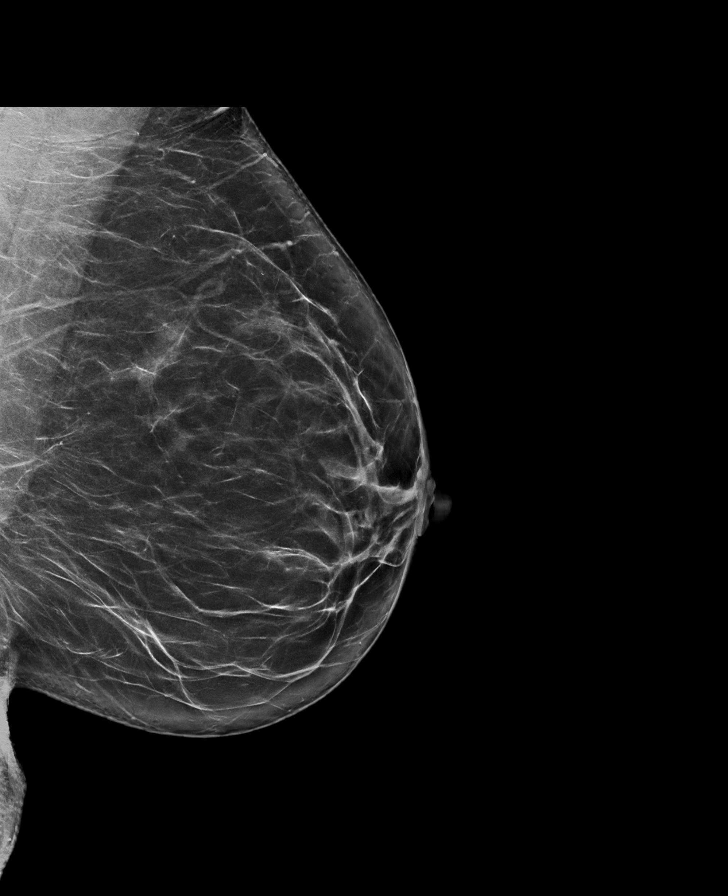

[R CC]
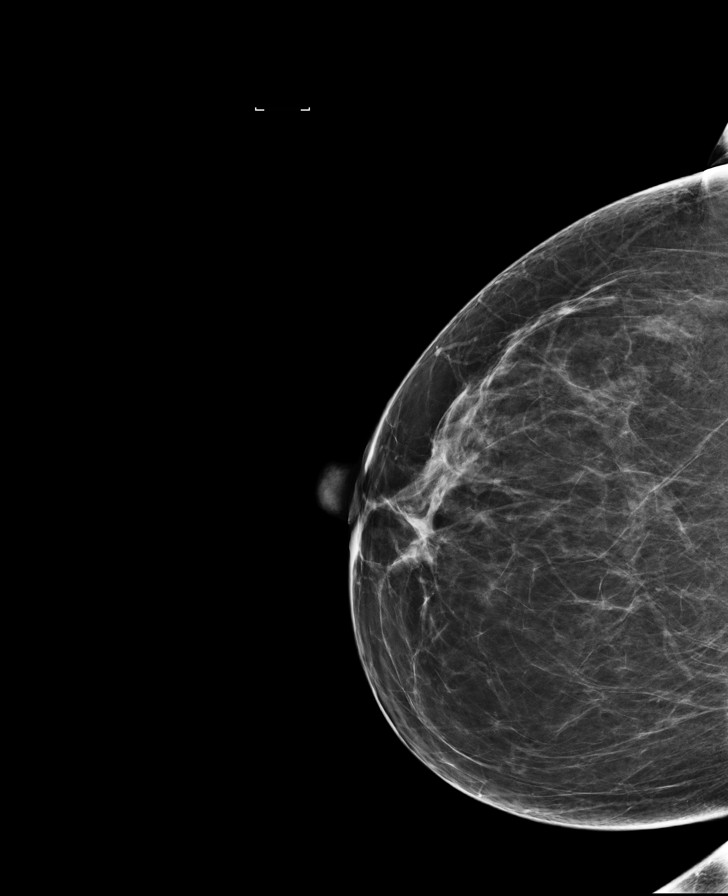

[R CC synth-2D]
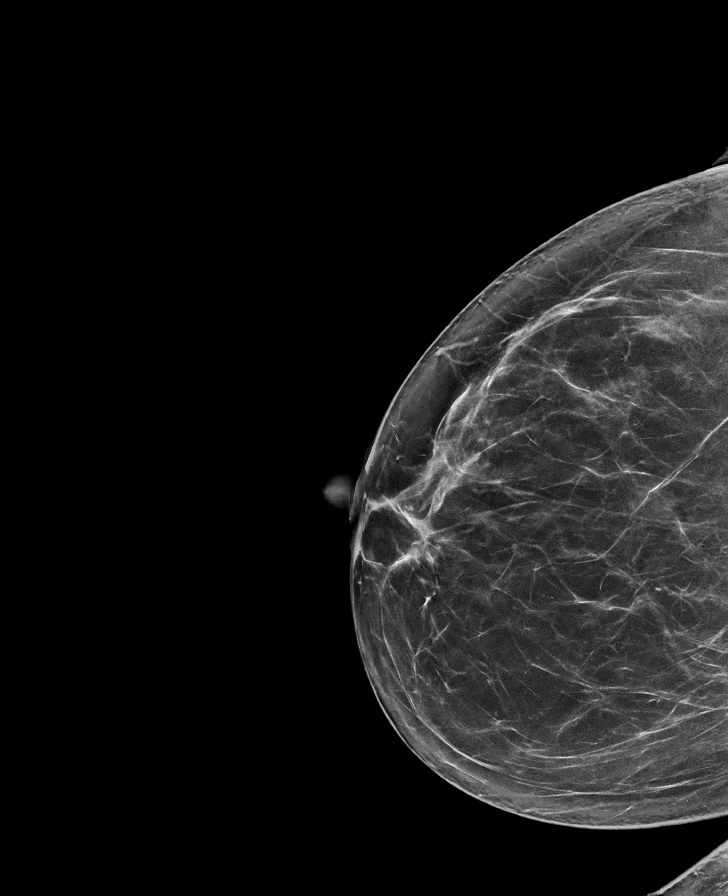

[L CC]
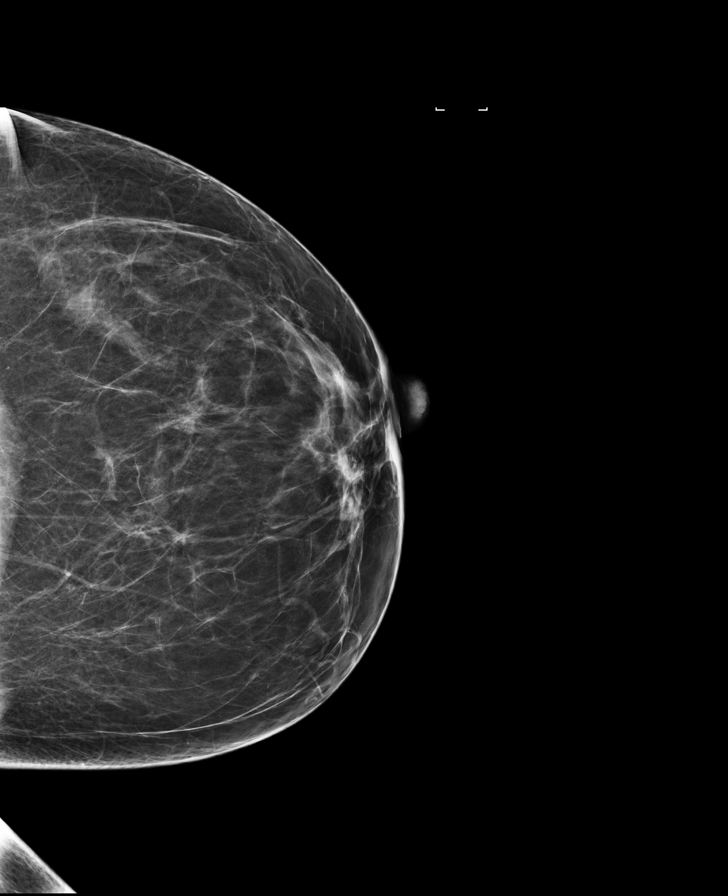

[L CC synth-2D]
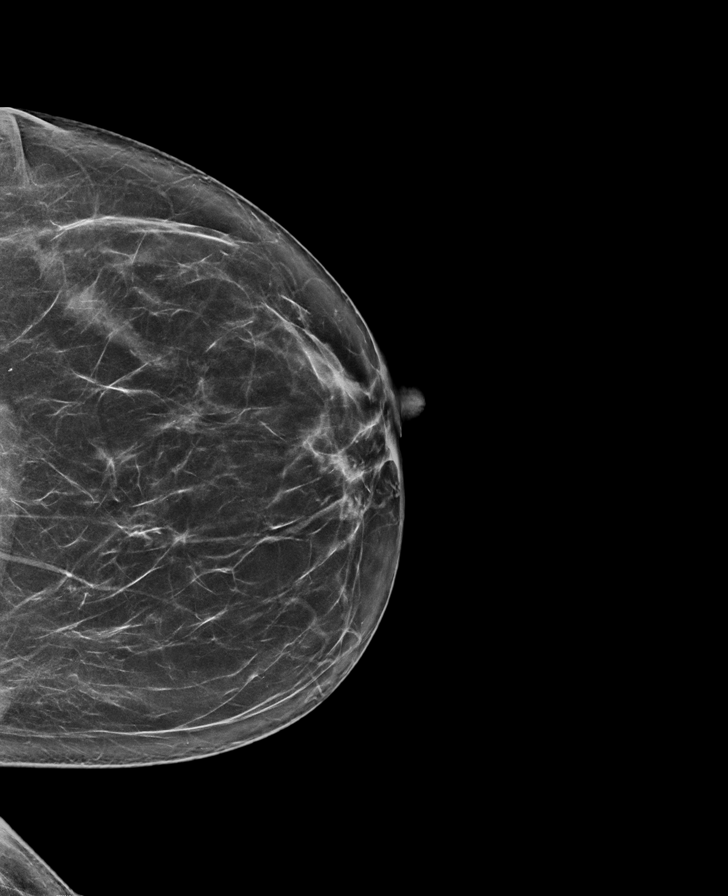

[R MLO synth-2D]
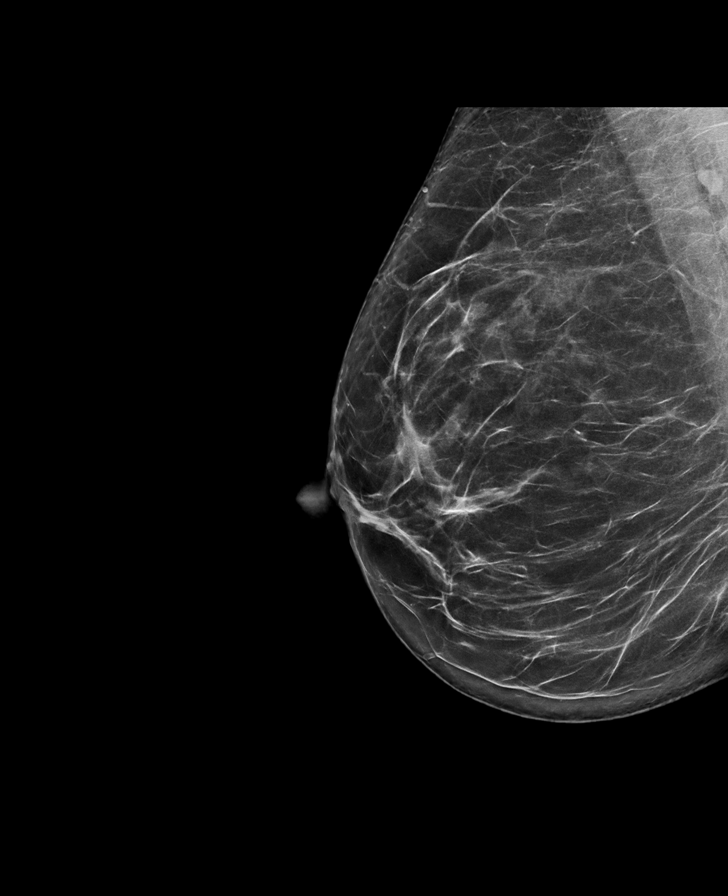

[R MLO]
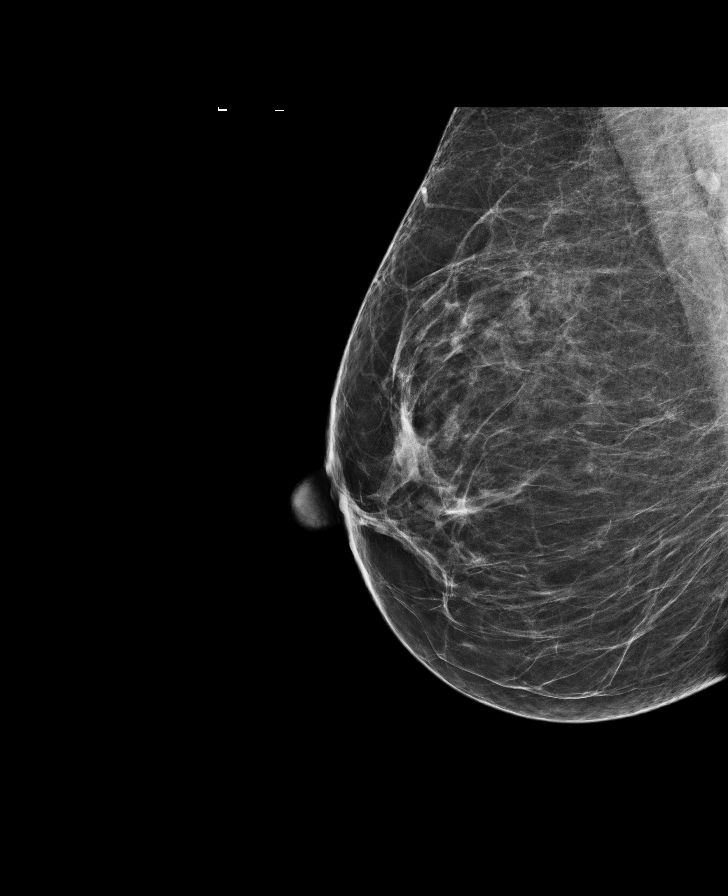

[8 of 28 positions shown; findings below may reference images not displayed]

ACR Breast Density Category b: There are scattered areas of
fibroglandular density.
FINDINGS: There are no findings suspicious for malignancy. Images were
processed with CAD.
IMPRESSION: No mammographic evidence of malignancy. A result letter of this
screening mammogram will be mailed directly to the patient.

RECOMMENDATION:
Screening mammogram in one year. (Code:97-6-RS4)

BI-RADS CATEGORY  1: Negative.

## 2017-09-24 ENCOUNTER — Ambulatory Visit: Payer: 59 | Admitting: Podiatry

## 2017-09-28 ENCOUNTER — Ambulatory Visit: Payer: 59 | Admitting: Podiatry

## 2017-10-11 ENCOUNTER — Encounter: Payer: Self-pay | Admitting: Podiatry

## 2017-10-11 ENCOUNTER — Ambulatory Visit (INDEPENDENT_AMBULATORY_CARE_PROVIDER_SITE_OTHER): Payer: 59

## 2017-10-11 ENCOUNTER — Ambulatory Visit (INDEPENDENT_AMBULATORY_CARE_PROVIDER_SITE_OTHER): Payer: 59 | Admitting: Podiatry

## 2017-10-11 VITALS — BP 115/74 | HR 68 | Resp 16

## 2017-10-11 DIAGNOSIS — M722 Plantar fascial fibromatosis: Secondary | ICD-10-CM

## 2017-10-11 MED ORDER — METHYLPREDNISOLONE 4 MG PO TBPK: ORAL_TABLET | ORAL | 0 refills | Status: DC

## 2017-10-11 MED ORDER — MELOXICAM 15 MG PO TABS: 15.0000 mg | ORAL_TABLET | Freq: Every day | ORAL | 3 refills | Status: DC

## 2017-10-11 NOTE — Progress Notes (Signed)
Subjective:  Patient ID: Lacey Decker, female    DOB: 1971/05/27,  MRN: [id number] HPI Chief Complaint  Patient presents with  . Foot Pain    Plantar heels bilateral - aching, stinging x 1 year, worse in last 3 months, AM pain, tried massaging-no help    46 y.o. female presents with the above complaint.     Past Medical History:  Diagnosis Date  . Alcohol abuse    last drink one year ago  . Anxiety   . Bipolar 1 disorder ([place])   . Chlamydia    as a teen  . MRSA (methicillin resistant staph aureus) culture positive    Past Surgical History:  Procedure Laterality Date  . ABDOMINAL HYSTERECTOMY    . btl  11/09/1998  . DILATION AND CURETTAGE OF UTERUS  1997   after miscarriage  . LEEP  2000  . misscarriage     x5  . NEUROMA SURGERY     lipoma removal Right labia  . nsvd     3    Current Outpatient Medications:  .  AMBULATORY NON FORMULARY MEDICATION, Ergonomic Keyboard and Mouse Dx: Elbow Pain, Disp: 1 each, Rfl: 0 .  cetirizine (ZYRTEC) 10 MG tablet, Take 10 mg by mouth daily., Disp: , Rfl:  .  diclofenac (VOLTAREN) 75 MG EC tablet, Take 1 tablet (75 mg total) by mouth 2 (two) times daily., Disp: 60 tablet, Rfl: 0 .  diclofenac sodium (VOLTAREN) 1 % GEL, Apply 4 g topically 4 (four) times daily., Disp: 1 Tube, Rfl: 3 .  fluticasone (FLONASE) 50 MCG/ACT nasal spray, Place 2 sprays into both nostrils daily., Disp: 48 g, Rfl: 11 .  omeprazole (PRILOSEC) 40 MG capsule, Take 40 mg by mouth daily., Disp: , Rfl:   Allergies  Allergen Reactions  . Codeine     REACTION: anaphylactic shock  . Morphine     REACTION: swelling  . Augmentin [Amoxicillin-Pot Clavulanate]   . Clarithromycin     REACTION: Trouble breathing - stomach upset - bad taste in mouth  . Erythromycin Base Other (See Comments)    Eye ointment - worsened conjunctivitis and swelling  . Sulfamethoxazole-Trimethoprim     REACTION: N \\T \ V, rash   Review of Systems  All other systems reviewed and are  negative.  Objective:  There were no vitals filed for this visit.  General: Well developed, nourished, in no acute distress, alert and oriented x3   Dermatological: Skin is warm, dry and supple bilateral. Nails x 10 are well maintained; remaining integument appears unremarkable at this time. There are no open sores, no preulcerative lesions, no rash or signs of infection present.  Vascular: Dorsalis Pedis artery and Posterior Tibial artery pedal pulses are 2/4 bilateral with immedate capillary fill time. Pedal hair growth present. No varicosities and no lower extremity edema present bilateral.   Neruologic: Grossly intact via light touch bilateral. Vibratory intact via tuning fork bilateral. Protective threshold with Semmes Wienstein monofilament intact to all pedal sites bilateral. Patellar and Achilles deep tendon reflexes 2+ bilateral. No Babinski or clonus noted bilateral.   Musculoskeletal: No gross boney pedal deformities bilateral. No pain, crepitus, or limitation noted with foot and ankle range of motion bilateral. Muscular strength 5/5 in all groups tested bilateral.  Gait: Unassisted, Nonantalgic.    Radiographs:  3 views bilaterally demonstrate no major osseous abnormalities plantar distally oriented calcaneal spurs and soft tissue increase in density plantar fascia insertion site right greater than left.  Assessment & Plan:  Assessment: plantar fasciitis right greater than left.  Plan: we discussed the etiology pathology conservative versus surgical therapies. We discussed appropriate shoe gear stretching exercises ice therapy issue modifications. He was provided with both oral and written home-going instructions for care stretching of his plantar fascia. Also injected the bilateral heels today with Kenalog and local anesthetic 20 mg of Kenalog 5 mg of Marcaine to the point of maximal tenderness at the plantar fascia calcaneal insertion sites bilaterally. He tolerated this  procedure well without complications. I also started him on a Medrol Dosepak to be followed by meloxicam. These were sent over electronically to the pharmacy. Discussed myofascial braces which were dispensed and a single night splint. I will follow-up with him in 1 month with questions or concerns.     Max T. [place], [place]

## 2017-10-11 NOTE — Patient Instructions (Signed)

## 2017-11-15 ENCOUNTER — Ambulatory Visit (INDEPENDENT_AMBULATORY_CARE_PROVIDER_SITE_OTHER): Payer: 59 | Admitting: Podiatry

## 2017-11-15 ENCOUNTER — Encounter: Payer: Self-pay | Admitting: Podiatry

## 2017-11-15 DIAGNOSIS — M722 Plantar fascial fibromatosis: Secondary | ICD-10-CM | POA: Diagnosis not present

## 2017-11-15 NOTE — Progress Notes (Signed)
She presents today for follow-up of her bilateral plantar fasciitis.  States that they are still sore but improved.  Objective: Vital signs are stable alert and oriented x3.  Pulses are palpable.  Neurologic sensorium is intact.  Deep tendon reflexes are intact she still has pain to palpation medial calcaneal tubercles bilateral heels.  Assessment: Plantar fasciitis bilateral.  Plan: Reinjected the bilateral heels today discussed the need for orthotics and I will follow-up with her in the near future.

## 2017-12-01 ENCOUNTER — Encounter: Payer: Self-pay | Admitting: Family Medicine

## 2017-12-13 ENCOUNTER — Ambulatory Visit: Payer: 59 | Admitting: Podiatry

## 2017-12-23 ENCOUNTER — Encounter: Payer: Self-pay | Admitting: Family Medicine

## 2018-01-17 ENCOUNTER — Ambulatory Visit: Payer: Self-pay

## 2018-01-17 NOTE — Telephone Encounter (Signed)
Patient called with c/o "neck swelling." She says "I have a knot on the left side of my neck that I noticed on last Friday. It's not obvious, but I can feel it when I turn my neck and it's about a dime size. I thought maybe it was muscular, because I have pain to my shoulder and neck, almost like I slept wrong or something, but it didn't go away over the weekend." I asked if she has difficulty swallowing, facial swelling, fever, chest pain; she said "no." According to protocol, see PCP within 24 hours, appointment made for tomorrow at 0900 with Dr. Diona Browner, care advice given, patient verbalized understanding.  Reason for Disposition . [1] Swelling is painful to touch AND [2] no fever  Answer Assessment - Initial Assessment Questions 1. APPEARANCE of SWELLING: "What does it look like?" (e.g., lymph node, insect bite, mole)    Can't see it, can feel it 2. SIZE: "How large is the swelling?" (inches, cm or compare to coins)     Dime 3. LOCATION: "Where is the swelling located?"     Left 4. ONSET: "When did the swelling start?"     Last Friday 5. PAIN: "Is it painful?" If so, ask: "How much?"    Left side of neck to shoulder 6. ITCH: "Does it itch?" If so, ask: "How much?"     No 7. CAUSE: "What do you think caused the swelling?"     I'm thinking muscle 8. OTHER SYMPTOMS: "Do you have any other symptoms?" (e.g., fever)     No  Protocols used: SKIN LUMP OR LOCALIZED SWELLING-A-AH

## 2018-01-18 ENCOUNTER — Ambulatory Visit (INDEPENDENT_AMBULATORY_CARE_PROVIDER_SITE_OTHER): Payer: 59 | Admitting: Family Medicine

## 2018-01-18 ENCOUNTER — Encounter: Payer: Self-pay | Admitting: Family Medicine

## 2018-01-18 VITALS — BP 124/78 | HR 80 | Temp 98.5°F | Ht 65.6 in | Wt 201.0 lb

## 2018-01-18 DIAGNOSIS — J301 Allergic rhinitis due to pollen: Secondary | ICD-10-CM | POA: Diagnosis not present

## 2018-01-18 DIAGNOSIS — S46812A Strain of other muscles, fascia and tendons at shoulder and upper arm level, left arm, initial encounter: Secondary | ICD-10-CM | POA: Diagnosis not present

## 2018-01-18 MED ORDER — FLUTICASONE PROPIONATE 50 MCG/ACT NA SUSP: 2.0000 | Freq: Every day | NASAL | 3 refills | Status: DC

## 2018-01-18 MED ORDER — CYCLOBENZAPRINE HCL 10 MG PO TABS: 5.0000 mg | ORAL_TABLET | Freq: Every evening | ORAL | 0 refills | Status: DC | PRN

## 2018-01-18 MED ORDER — DICLOFENAC SODIUM 75 MG PO TBEC: 75.0000 mg | DELAYED_RELEASE_TABLET | Freq: Two times a day (BID) | ORAL | 0 refills | Status: DC

## 2018-01-18 NOTE — Progress Notes (Signed)
   Subjective:    Patient ID: Lacey Decker, female    DOB: 03/09/1971, 48 y.o.   MRN: [id number]  HPI   47 year old female present with new onset  Pain in left neck.  She awoke 4 days ago after sleeping strangly on neck.  In massaging neck she noted soft swelling in left neck.   Pain is improving some. In last few days.  No nubness, no tingling.  Has been using ibuprofen 400 mg every 6 hours.  Hx of neck pain on right from trapezius strain. She has anxiety, bipolar disorder, was improved after her divorce.    She is having some allergy symptoms.Marrian Salvage thoat and congestion in ear    Review of Systems  Constitutional: Negative for fatigue and fever.  HENT: Positive for congestion.   Eyes: Negative for pain.  Respiratory: Negative for cough and shortness of breath.   Cardiovascular: Negative for chest pain, palpitations and leg swelling.  Gastrointestinal: Negative for abdominal pain.  Genitourinary: Negative for dysuria and vaginal bleeding.  Musculoskeletal: Negative for back pain.  Neurological: Negative for syncope, light-headedness and headaches.  Psychiatric/Behavioral: Negative for dysphoric mood.       Objective:   Physical Exam  Constitutional: Vital signs are normal. She appears well-developed and well-nourished. She is cooperative.  Non-toxic appearance. She does not appear ill. No distress.  HENT:  Head: Normocephalic.  Right Ear: Hearing, external ear and ear canal normal. Tympanic membrane is not erythematous, not retracted and not bulging. A middle ear effusion is present.  Left Ear: Hearing, external ear and ear canal normal. Tympanic membrane is not erythematous, not retracted and not bulging. A middle ear effusion is present.  Nose: Mucosal edema present. No rhinorrhea. Right sinus exhibits no maxillary sinus tenderness and no frontal sinus tenderness. Left sinus exhibits no maxillary sinus tenderness and no frontal sinus tenderness.  Mouth/Throat: Uvula is  midline, oropharynx is clear and moist and mucous membranes are normal.  Eyes: Conjunctivae, EOM and lids are normal. Pupils are equal, round, and reactive to light. Lids are everted and swept, no foreign bodies found.  Neck: Trachea normal and normal range of motion. Neck supple. Carotid bruit is not present. No thyroid mass and no thyromegaly present.  Cardiovascular: Normal rate, regular rhythm, S1 normal, S2 normal, normal heart sounds, intact distal pulses and normal pulses. Exam reveals no gallop and no friction rub.  No murmur heard. Pulmonary/Chest: Effort normal and breath sounds normal. No tachypnea. No respiratory distress. She has no decreased breath sounds. She has no wheezes. She has no rhonchi. She has no rales.  Musculoskeletal:       Cervical back: She exhibits tenderness. She exhibits normal range of motion and no bony tenderness.  ttp over left trapezius muscle  Neg spurling bilaterally   Neurological: She is alert. She has normal strength. No cranial nerve deficit or sensory deficit.  Skin: Skin is warm, dry and intact. No rash noted.  Psychiatric: Her speech is normal and behavior is normal. Judgment normal. Her mood appears not anxious. Cognition and memory are normal. She does not exhibit a depressed mood.          Assessment & Plan:

## 2018-01-18 NOTE — Patient Instructions (Signed)
Heat, and gentle stretching of neck.  Start diclofenac twice daily for pain and inflammation. Can use muscle relaxant at night.  Restart flonase and can try allergra OTC 180 mg at bedtime for allergies.  Call if not improving.

## 2018-01-18 NOTE — Assessment & Plan Note (Signed)
Restart flonase and can try trial allegra. If not improving consider addition of singulair at bedtime.

## 2018-01-27 ENCOUNTER — Ambulatory Visit (INDEPENDENT_AMBULATORY_CARE_PROVIDER_SITE_OTHER): Payer: 59 | Admitting: Family Medicine

## 2018-01-27 ENCOUNTER — Encounter: Payer: Self-pay | Admitting: Family Medicine

## 2018-01-27 ENCOUNTER — Other Ambulatory Visit: Payer: Self-pay

## 2018-01-27 VITALS — BP 90/60 | HR 70 | Temp 98.6°F | Ht 65.25 in | Wt 196.2 lb

## 2018-01-27 DIAGNOSIS — Z0000 Encounter for general adult medical examination without abnormal findings: Secondary | ICD-10-CM | POA: Diagnosis not present

## 2018-01-27 DIAGNOSIS — R32 Unspecified urinary incontinence: Secondary | ICD-10-CM

## 2018-01-27 DIAGNOSIS — F310 Bipolar disorder, current episode hypomanic: Secondary | ICD-10-CM | POA: Diagnosis not present

## 2018-01-27 DIAGNOSIS — M542 Cervicalgia: Secondary | ICD-10-CM

## 2018-01-27 LAB — POC URINALSYSI DIPSTICK (AUTOMATED)
Bilirubin, UA: NEGATIVE
Blood, UA: NEGATIVE
GLUCOSE UA: NEGATIVE
Ketones, UA: NEGATIVE
Leukocytes, UA: NEGATIVE
NITRITE UA: NEGATIVE
PROTEIN UA: NEGATIVE
Spec Grav, UA: >=1.03 — AB (ref 1.010–1.025)
UROBILINOGEN UA: 0.2 U/dL
pH, UA: 6 (ref 5.0–8.0)

## 2018-01-27 NOTE — Assessment & Plan Note (Signed)
eval wiuth UA for infection.  Will have vaginal exam at [place].. May have cystocele.  S/P AH, one ovary remaining.

## 2018-01-27 NOTE — Progress Notes (Signed)
Subjective:    Patient ID: Lacey Decker, female    DOB: 1971-02-09, 47 y.o.   MRN: [id number]  HPI The patient is here for annual wellness exam and preventative care.     Her neck apin and swelling is better, resolved.    Recent labs done.Marland Kitchen  LDL 112  exercise:  none  Diet: weight watchers  glucose 99   Bipolar disorder, GAD: Depression: Hates side effects of medications.  Has racing thoughts off and on, anxiety, trouble sleeping.  Depakote worked well but caused hair loss.  PHQ 9 12.   She has noted decrease in memory over time.  Name recall. Short term recall.  No cloudy thinking.  No family history of alzheimer's  She is having issues with bladder leakage. Notes always had  With coughing and sneezing, now has loss of urine.  Noted in last few weeks.  No uregency, no frequency, no burning  Just loss of control at time, small amounts.  GERD:  Using  zantac prn with moderate control.    Social History /Family History/Past Medical History reviewed in detail and updated in EMR if needed. Blood pressure 90/60, pulse 70, temperature 98.6 F (37 C), temperature source Oral, height 5' 5.25" (1.657 m), weight 196 lb 4 oz (89 kg).  Review of Systems  Constitutional: Negative for fatigue and fever.  HENT: Negative for congestion.   Eyes: Negative for pain.  Respiratory: Negative for cough and shortness of breath.   Cardiovascular: Negative for chest pain, palpitations and leg swelling.  Gastrointestinal: Negative for abdominal pain.  Genitourinary: Negative for dysuria and vaginal bleeding.  Musculoskeletal: Negative for back pain.  Neurological: Negative for syncope, light-headedness and headaches.  Psychiatric/Behavioral: Positive for agitation, dysphoric mood and sleep disturbance. Negative for self-injury and suicidal ideas. The patient is nervous/anxious.        Objective:   Physical Exam  Constitutional: Vital signs are normal. She appears well-developed and  well-nourished. She is cooperative.  Non-toxic appearance. She does not appear ill. No distress.  HENT:  Head: Normocephalic.  Right Ear: Hearing, tympanic membrane, external ear and ear canal normal.  Left Ear: Hearing, tympanic membrane, external ear and ear canal normal.  Nose: Nose normal.  Eyes: Pupils are equal, round, and reactive to light. Conjunctivae, EOM and lids are normal. Lids are everted and swept, no foreign bodies found.  Neck: Trachea normal and normal range of motion. Neck supple. Carotid bruit is not present. No thyroid mass and no thyromegaly present.  Cardiovascular: Normal rate, regular rhythm, S1 normal, S2 normal, normal heart sounds and intact distal pulses. Exam reveals no gallop.  No murmur heard. Pulmonary/Chest: Effort normal and breath sounds normal. No respiratory distress. She has no wheezes. She has no rhonchi. She has no rales.  Abdominal: Soft. Normal appearance and bowel sounds are normal. She exhibits no distension, no fluid wave, no abdominal bruit and no mass. There is no hepatosplenomegaly. There is no tenderness. There is no rebound, no guarding and no CVA tenderness. No hernia.  Lymphadenopathy:    She has no cervical adenopathy.    She has no axillary adenopathy.  Neurological: She is alert. She has normal strength. No cranial nerve deficit or sensory deficit.  Skin: Skin is warm, dry and intact. No rash noted.  Psychiatric: Judgment and thought content normal. Her mood appears anxious. Her speech is rapid and/or pressured. She is agitated. Cognition and memory are normal. She exhibits a depressed mood.   Tearful but agitated  at the same time  nml MMSE today.          Assessment & Plan:  The patient's preventative maintenance and recommended screening tests for an annual wellness exam were reviewed in full today. Brought up to date unless services declined.  Counselled on the importance of diet, exercise, and its role in overall health and  mortality. The patient's FH and SH was reviewed, including their home life, tobacco status, and drug and alcohol status.   Vaccines: uptodate with Td. Pap/DVE:     Per GYN Mammo:   Per GYN Bone Density:not indicated Colon: Not indicated, no early family history  Smoking Status: none ETOH/ drug use: minimal  HIV screen:   completed in past

## 2018-01-27 NOTE — Assessment & Plan Note (Signed)
Refer to counsler and psychiatrist. She would benefit with from medication to treat.  May be causing memory issues.

## 2018-01-27 NOTE — Patient Instructions (Addendum)
Get back on track with exercise.  Keep working on healthy eating. Please stop at the front desk to set up referral.   No infection in urine.Marland Kitchen discuss incontinence with GYN.

## 2018-01-27 NOTE — Addendum Note (Signed)
Addended by: Carter Kitten on: 01/27/2018 10:52 AM   Modules accepted: Orders

## 2018-01-27 NOTE — Assessment & Plan Note (Signed)
resolved 

## 2018-02-08 ENCOUNTER — Encounter: Payer: Self-pay | Admitting: Obstetrics and Gynecology

## 2018-02-08 ENCOUNTER — Ambulatory Visit (INDEPENDENT_AMBULATORY_CARE_PROVIDER_SITE_OTHER): Payer: 59 | Admitting: Obstetrics and Gynecology

## 2018-02-08 VITALS — BP 118/80 | HR 80 | Ht 65.0 in | Wt 195.0 lb

## 2018-02-08 DIAGNOSIS — N393 Stress incontinence (female) (male): Secondary | ICD-10-CM

## 2018-02-08 DIAGNOSIS — Z01419 Encounter for gynecological examination (general) (routine) without abnormal findings: Secondary | ICD-10-CM

## 2018-02-08 DIAGNOSIS — Z1231 Encounter for screening mammogram for malignant neoplasm of breast: Secondary | ICD-10-CM | POA: Diagnosis not present

## 2018-02-08 DIAGNOSIS — B9689 Other specified bacterial agents as the cause of diseases classified elsewhere: Secondary | ICD-10-CM

## 2018-02-08 DIAGNOSIS — N760 Acute vaginitis: Secondary | ICD-10-CM

## 2018-02-08 DIAGNOSIS — F3281 Premenstrual dysphoric disorder: Secondary | ICD-10-CM

## 2018-02-08 DIAGNOSIS — R1031 Right lower quadrant pain: Secondary | ICD-10-CM

## 2018-02-08 DIAGNOSIS — N3942 Incontinence without sensory awareness: Secondary | ICD-10-CM | POA: Diagnosis not present

## 2018-02-08 DIAGNOSIS — Z1239 Encounter for other screening for malignant neoplasm of breast: Secondary | ICD-10-CM

## 2018-02-08 LAB — POCT WET PREP WITH KOH
Clue Cells Wet Prep HPF POC: POSITIVE
KOH PREP POC: POSITIVE — AB
Trichomonas, UA: NEGATIVE
YEAST WET PREP PER HPF POC: NEGATIVE

## 2018-02-08 MED ORDER — LORAZEPAM 0.5 MG PO TABS
0.5000 mg | ORAL_TABLET | Freq: Three times a day (TID) | ORAL | 0 refills | Status: DC
Start: 2018-02-08 — End: 2018-08-24

## 2018-02-08 MED ORDER — METRONIDAZOLE 0.75 % VA GEL: 1.0000 | Freq: Every day | VAGINAL | 0 refills | Status: DC

## 2018-02-08 NOTE — Progress Notes (Signed)
PCP:  [name], MD   Chief Complaint  Patient presents with  . Gynecologic Exam     HPI:      Lacey Decker is a 47 y.o. [id number] who LMP was No LMP recorded. Patient has had a hysterectomy., presents today for her annual examination.  Her menses are absent due to Grant LSO due to endometriosis/adenomyosis in 2009. She has noticed some mild RLQ cramping cyclically over the yr. Sx relieved with NSAIDs. She does not have intermenstrual bleeding. She has anxiety/PMDD sx cyclically and takes ativan sparingly. She needs RF. She also is having increasing vasomotor sx.  Sex activity: single partner, contraception - hysterectomy. Last Pap: September 24, 2015  Results were: no abnormalities /neg HPV DNA  Hx of STDs: HPV on pap  She has noticed increased vag d/c, odor, no irritation for the past 2 wks. Hx of BV in the past.   Last mammogram: December 29, 2016  Results were: normal--routine follow-up in 12 months There is no FH of breast cancer. There is no FH of ovarian cancer. The patient does not do self-breast exams.  Tobacco use: The patient denies current or previous tobacco use. Alcohol use: none No drug use.  Exercise: moderately active  She does not get adequate calcium and Vitamin D in her diet.  Labs with PCP.  She has had increased urinary incont the past few months. She used to have SUI but now sx are just a few drops of leakage randomly. No urge incont sx. She drinks a little caffeine. She sometimes holds her urine due to her job. She saw urogynecology about 10 yrs ago and was told her bladder had dropped.    Past Medical History:  Diagnosis Date  . Adenomyosis   . Alcohol abuse    last drink one year ago  . Anxiety   . Bipolar 1 disorder ([facility])   . Chlamydia    as a teen  . Endometriosis   . MRSA (methicillin resistant staph aureus) culture positive   . PMDD (premenstrual dysphoric disorder)   . SUI (stress urinary incontinence, female)     Past Surgical  History:  Procedure Laterality Date  . ABDOMINAL HYSTERECTOMY  2009   total lap hyst, LSO due to adenomyosis/endometriosis  . btl  11/09/1998  . DILATION AND CURETTAGE OF UTERUS  1997   after miscarriage  . LEEP  2000  . misscarriage     x5  . NEUROMA SURGERY     lipoma removal Right labia  . nsvd     3    Family History  Problem Relation Age of Onset  . Hypertension Mother   . Hyperlipidemia Mother   . Depression Mother   . Bipolar disorder Sister   . Depression Maternal Grandmother   . Breast cancer Neg Hx     Social History   Socioeconomic History  . Marital status: Single    Spouse name: Not on file  . Number of children: 3  . Years of education: Not on file  . Highest education level: Not on file  Occupational History    Employer: [employer]  . Financial resource strain: Not on file  . Food insecurity:    Worry: Not on file    Inability: Not on file  . Transportation needs:    Medical: Not on file    Non-medical: Not on file  Tobacco Use  . Smoking status: Never Smoker  . Smokeless tobacco:  Never Used  Substance and Sexual Activity  . Alcohol use: No    Alcohol/week: 0.0 oz  . Drug use: No  . Sexual activity: Yes  Lifestyle  . Physical activity:    Days per week: Not on file    Minutes per session: Not on file  . Stress: Not on file  Relationships  . Social connections:    Talks on phone: Not on file    Gets together: Not on file    Attends religious service: Not on file    Active member of club or organization: Not on file    Attends meetings of clubs or organizations: Not on file    Relationship status: Not on file  . Intimate partner violence:    Fear of current or ex partner: Not on file    Emotionally abused: Not on file    Physically abused: Not on file    Forced sexual activity: Not on file  Other Topics Concern  . Not on file  Social History Narrative  . Not on file    Outpatient Medications Prior to Visit    Medication Sig Dispense Refill  . cetirizine (ZYRTEC) 10 MG tablet Take 10 mg by mouth daily.    . cyclobenzaprine (FLEXERIL) 10 MG tablet Take 0.5-1 tablets (5-10 mg total) by mouth at bedtime as needed for muscle spasms. 15 tablet 0  . diclofenac (VOLTAREN) 75 MG EC tablet Take 1 tablet (75 mg total) by mouth 2 (two) times daily. 60 tablet 0  . diclofenac sodium (VOLTAREN) 1 % GEL Apply 4 g topically 4 (four) times daily. 1 Tube 3  . fluticasone (FLONASE) 50 MCG/ACT nasal spray Place 2 sprays into both nostrils daily. 48 g 3  . meloxicam (MOBIC) 15 MG tablet Take 1 tablet (15 mg total) by mouth daily. 30 tablet 3  . omeprazole (PRILOSEC) 40 MG capsule Take 40 mg by mouth daily.     No facility-administered medications prior to visit.       ROS:  Review of Systems  Constitutional: Negative for fatigue, fever and unexpected weight change.  Respiratory: Negative for cough, shortness of breath and wheezing.   Cardiovascular: Negative for chest pain, palpitations and leg swelling.  Gastrointestinal: Positive for constipation. Negative for blood in stool, diarrhea, nausea and vomiting.  Endocrine: Negative for cold intolerance, heat intolerance and polyuria.  Genitourinary: Positive for vaginal discharge. Negative for dyspareunia, dysuria, flank pain, frequency, genital sores, hematuria, menstrual problem, pelvic pain, urgency, vaginal bleeding and vaginal pain.  Musculoskeletal: Negative for back pain, joint swelling and myalgias.  Skin: Negative for rash.  Neurological: Negative for dizziness, syncope, light-headedness, numbness and headaches.  Hematological: Negative for adenopathy.  Psychiatric/Behavioral: Negative for agitation, confusion, sleep disturbance and suicidal ideas. The patient is not nervous/anxious.    BREAST: No symptoms   Objective: BP 118/80   Pulse 80   Ht 5\' 5"  (1.651 m)   Wt 195 lb (88.5 kg)   BMI 32.45 kg/m    Physical Exam  Constitutional: She is  oriented to person, place, and time. She appears well-developed and well-nourished.  Genitourinary: Vagina normal. There is no rash or tenderness on the right labia. There is no rash or tenderness on the left labia. No erythema or tenderness in the vagina. No vaginal discharge found. Right adnexum does not display mass and does not display tenderness. Left adnexum does not display mass and does not display tenderness.  Genitourinary Comments: UTERUS/CX SURG REM; GRADE 1 CYSTOCELE  Neck:  Normal range of motion. No thyromegaly present.  Cardiovascular: Normal rate, regular rhythm and normal heart sounds.  No murmur heard. Pulmonary/Chest: Effort normal and breath sounds normal. Right breast exhibits no mass, no nipple discharge, no skin change and no tenderness. Left breast exhibits no mass, no nipple discharge, no skin change and no tenderness.  Abdominal: Soft. There is no tenderness. There is no guarding.  Musculoskeletal: Normal range of motion.  Neurological: She is alert and oriented to person, place, and time. No cranial nerve deficit.  Psychiatric: She has a normal mood and affect. Her behavior is normal.  Vitals reviewed.   Results: Results for orders placed or performed in visit on 02/08/18 (from the past 24 hour(s))  POCT Wet Prep with KOH     Status: Abnormal   Collection Time: 02/08/18  8:54 AM  Result Value Ref Range   Trichomonas, UA Negative    Clue Cells Wet Prep HPF POC POS    Epithelial Wet Prep HPF POC  Few, Moderate, Many, Too numerous to count   Yeast Wet Prep HPF POC NEG    Bacteria Wet Prep HPF POC  Few   RBC Wet Prep HPF POC     WBC Wet Prep HPF POC     KOH Prep POC Positive (A) Negative    Assessment/Plan: Encounter for annual routine gynecological examination  Screening for breast cancer - Pt to sched mammo - Plan: MM DIGITAL SCREENING BILATERAL  Bacterial vaginosis - Pos wet prep. Rx metrogel. F/u prn.  - Plan: POCT Wet Prep with KOH, metroNIDAZOLE  (METROGEL) 0.75 % vaginal gel  Urinary incontinence without sensory awareness - Pt to d/c caffeine, void more frequently. Grade 1 cystocele. Question overflow. If sx persist, f/u wiht Dr. Kenton Kingfisher.   SUI (stress urinary incontinence, female)  PMDD (premenstrual dysphoric disorder) - Rx RF ativan prn sx. Uses sparingly. - Plan: LORazepam (ATIVAN) 0.5 MG tablet  RLQ cramping - Cyclically with hx of endometriosis. Relieved with NSAIDs currently. Follow expectantly.   Meds ordered this encounter  Medications  . metroNIDAZOLE (METROGEL) 0.75 % vaginal gel    Sig: Place 1 Applicatorful vaginally at bedtime for 5 days.    Dispense:  50 g    Refill:  0    Order Specific Question:   Supervising Provider    Answer:   Gae Dry [id number]  . LORazepam (ATIVAN) 0.5 MG tablet    Sig: Take 1 tablet (0.5 mg total) by mouth every 8 (eight) hours.    Dispense:  30 tablet    Refill:  0    Order Specific Question:   Supervising Provider    Answer:   Gae Dry [[id number]]             GYN counsel breast self exam, mammography screening, menopause, adequate intake of calcium and vitamin D, diet and exercise     F/U  Return in about 1 year (around 02/09/2019).  Lacey B. Copland, PA-C 02/08/2018 9:03 AM

## 2018-02-08 NOTE — Patient Instructions (Addendum)
I value your feedback and entrusting us with your care. If you get a [place] [facility] patient survey, I would appreciate you taking the time to let us know about your experience today. Thank you!  [facility]: [phone]    

## 2018-02-09 ENCOUNTER — Other Ambulatory Visit: Payer: Self-pay | Admitting: Obstetrics and Gynecology

## 2018-02-09 DIAGNOSIS — B9689 Other specified bacterial agents as the cause of diseases classified elsewhere: Secondary | ICD-10-CM

## 2018-02-09 DIAGNOSIS — N760 Acute vaginitis: Secondary | ICD-10-CM

## 2018-02-09 MED ORDER — METRONIDAZOLE 500 MG PO TABS: 500.0000 mg | ORAL_TABLET | Freq: Two times a day (BID) | ORAL | 0 refills | Status: AC

## 2018-02-09 NOTE — Progress Notes (Signed)
Rx change for BV due to insurance coverage.

## 2018-02-21 ENCOUNTER — Telehealth: Payer: Self-pay | Admitting: Podiatry

## 2018-02-21 NOTE — Telephone Encounter (Signed)
I have a question for the nurse about my ankle braces. If you could please call me back at [phone]. Thank you so much. Have a wonderful day.

## 2018-02-21 NOTE — Telephone Encounter (Signed)
This is my 4th call since this morning at 9:00 am this morning. I've been trying to reach a nurse or someone who can answer a question for me. I have not received any return calls and again this is my 4th time calling since 9:00 am. If someone could give me a call back I would really appreciate it. My number is [phone]. Thank you had have a wonderful day.

## 2018-02-21 NOTE — Telephone Encounter (Signed)
I returned patient call.  She stated that she was still hurting and the injection did not help.  She also stated that she did not take the Meloxicam like she should have.  I informed her to use her night splint and wear the brace daily for the next couple of weeks, take the Meloxicam with food daily and use ice and soak in Epson salt.  If no relief in the next couple of weeks, she will come back in for reevaluation.  She verbalized understanding.

## 2018-04-05 ENCOUNTER — Ambulatory Visit
Admission: RE | Admit: 2018-04-05 | Discharge: 2018-04-05 | Disposition: A | Discharge status: Home / Self Care | Payer: 59 | Source: Ambulatory Visit | Attending: Obstetrics and Gynecology | Admitting: Obstetrics and Gynecology

## 2018-04-05 ENCOUNTER — Telehealth: Payer: Self-pay | Admitting: Family Medicine

## 2018-04-05 DIAGNOSIS — Z1231 Encounter for screening mammogram for malignant neoplasm of breast: Secondary | ICD-10-CM | POA: Insufficient documentation

## 2018-04-05 DIAGNOSIS — Z1239 Encounter for other screening for malignant neoplasm of breast: Secondary | ICD-10-CM

## 2018-04-05 NOTE — Telephone Encounter (Signed)
Copied from [place] [phone]. Topic: Quick Communication - See Telephone Encounter >> Apr 05, 2018  3:57 PM Arletha Grippe wrote: CRM for notification. See Telephone encounter for: 04/05/18. Pt called - she has questions related to menopause.  She has some thing going on.  Cb is [phone]  Did not want to make appt

## 2018-04-05 NOTE — Telephone Encounter (Signed)
Lacey Decker, please call to to get questions, if she wishes to only speak with me, let me know. I will call her on Friday.

## 2018-04-06 ENCOUNTER — Encounter: Payer: Self-pay | Admitting: Obstetrics and Gynecology

## 2018-04-06 NOTE — Telephone Encounter (Signed)
Spoke with Lacey Decker.  She is concerned because for about the last 2 months she has started having very bad body odor under her arms.  She states she uses deodorant and within a hour or two she had b/o.  She has used Transport planner, [employer], Market researcher.  Then she switched to men's deodorant and tried Armed forces operational officer and Old Spice.  Nothing is helping.  She wasn't sure if this could be a hormonal change from menopause.  She denies any change in her diet.  She states someone told her it could be from all the protein shakes she drinks. Patient is aware that Dr. Diona Browner is not back in the office until Friday.  Please advise.

## 2018-04-07 NOTE — Telephone Encounter (Signed)
Dahiana notified as instructed by telephone.  

## 2018-04-07 NOTE — Telephone Encounter (Signed)
Most likely due to medication, supplement or diet change. Return to previous diet, stop protein shake if she is bothered by it. I do not think that this is a red flag for any serious disease. Most likely not due to menopause.

## 2018-04-15 ENCOUNTER — Encounter

## 2018-04-15 ENCOUNTER — Encounter (INDEPENDENT_AMBULATORY_CARE_PROVIDER_SITE_OTHER): Payer: 59 | Admitting: Podiatry

## 2018-04-15 ENCOUNTER — Encounter: Payer: Self-pay | Admitting: Podiatry

## 2018-04-15 DIAGNOSIS — M722 Plantar fascial fibromatosis: Secondary | ICD-10-CM | POA: Diagnosis not present

## 2018-04-15 DIAGNOSIS — M779 Enthesopathy, unspecified: Secondary | ICD-10-CM | POA: Diagnosis not present

## 2018-04-15 MED ORDER — METHYLPREDNISOLONE 4 MG PO TBPK: ORAL_TABLET | ORAL | 0 refills | Status: DC

## 2018-04-19 NOTE — Progress Notes (Signed)
   Subjective: 47 year old female presenting today for follow up evaluation of right foot pain. She states the plantar fasciitis is still painful. She reports severe stabbing pain to the 1st MPJ of the right foot as well. She has been wearing the night splint with some relief of the pain. She has been taking Meloxicam with no significant relief. Patient is here for further evaluation and treatment.   Past Medical History:  Diagnosis Date  . Adenomyosis   . Alcohol abuse    last drink one year ago  . Anxiety   . Bipolar 1 disorder ([place])   . Chlamydia    as a teen  . Endometriosis   . MRSA (methicillin resistant staph aureus) culture positive   . PMDD (premenstrual dysphoric disorder)   . SUI (stress urinary incontinence, female)      Objective: Physical Exam General: The patient is alert and oriented x3 in no acute distress.  Dermatology: Skin is warm, dry and supple bilateral lower extremities. Negative for open lesions or macerations bilateral.   Vascular: Dorsalis Pedis and Posterior Tibial pulses palpable bilateral.  Capillary fill time is immediate to all digits.  Neurological: Epicritic and protective threshold intact bilateral.   Musculoskeletal: Tenderness to palpation to the plantar aspect of the right heel along the plantar fascia as well as the 1st MPJ of the right foot. All other joints range of motion within normal limits bilateral. Strength 5/5 in all groups bilateral.    Assessment: 1. Plantar fasciitis right 2. 1st MPJ capsulitis right   Plan of Care:  1. Patient evaluated.   2. Injection of 0.5cc Celestone soluspan injected into the right plantar fascia  3. Rx for Medrol Dose pack placed. Then resume taking Mobic.  4. CAM boot dispensed. Weightbearing as tolerated.  5. Return to clinic in 4 weeks with Dr. Milinda Pointer.    Edrick Kins, DPM [facility]  Dr. Edrick Kins, DPM    [address], [place] [address]                Office [phone]  Fax [phone]     This encounter was created in error - please disregard.

## 2018-04-27 ENCOUNTER — Ambulatory Visit: Payer: 59 | Admitting: Podiatry

## 2018-05-06 ENCOUNTER — Encounter: Payer: Self-pay | Admitting: Family Medicine

## 2018-05-06 ENCOUNTER — Ambulatory Visit (INDEPENDENT_AMBULATORY_CARE_PROVIDER_SITE_OTHER): Payer: 59 | Admitting: Family Medicine

## 2018-05-06 VITALS — BP 142/76 | HR 117 | Temp 99.0°F | Ht 65.0 in | Wt 188.0 lb

## 2018-05-06 DIAGNOSIS — R350 Frequency of micturition: Secondary | ICD-10-CM | POA: Diagnosis not present

## 2018-05-06 LAB — POC URINALSYSI DIPSTICK (AUTOMATED)
BILIRUBIN UA: NEGATIVE
Blood, UA: NEGATIVE
Glucose, UA: NEGATIVE
LEUKOCYTES UA: NEGATIVE
Nitrite, UA: NEGATIVE
PROTEIN UA: NEGATIVE
Spec Grav, UA: >=1.03 — AB (ref 1.010–1.025)
Urobilinogen, UA: 0.2 E.U./dL
pH, UA: 6 (ref 5.0–8.0)

## 2018-05-06 MED ORDER — CIPROFLOXACIN HCL 250 MG PO TABS: 250.0000 mg | ORAL_TABLET | Freq: Two times a day (BID) | ORAL | 0 refills | Status: DC

## 2018-05-06 NOTE — Patient Instructions (Addendum)
Drink plenty of water and hold the antibiotics for now, start if you have burning with urination.  We'll contact you with your lab report.  Take care.

## 2018-05-06 NOTE — Progress Notes (Signed)
Dysuria: yes, urgency, frequency.  No burning with urination.  duration of symptoms: started about 6 days ago.  abdominal pain:no fevers:no back pain: some episodic L lower back pain but no CVA pain.   Vomiting:no She had episodes of vertigo recently not but now.   U/a d/w pt.    She was anxious in general and that may contribute to pulse elevation, along with prednisone use.    Meds, vitals, and allergies reviewed.   Per HPI unless specifically indicated in ROS section   GEN: nad, alert and oriented HEENT: mucous membranes moist NECK: supple CV: rrr.  PULM: ctab, no inc wob ABD: soft, +bs, suprapubic area not tender EXT: no edema SKIN: no acute rash BACK: no CVA pain R foot in boot per outside clinic.

## 2018-05-07 LAB — URINE CULTURE
MICRO NUMBER: 90774922
Result:: NO GROWTH
SPECIMEN QUALITY:: ADEQUATE

## 2018-05-08 DIAGNOSIS — R350 Frequency of micturition: Secondary | ICD-10-CM | POA: Insufficient documentation

## 2018-05-08 NOTE — Assessment & Plan Note (Signed)
Urine is concentrated without glucose present.  Inc PO fluids.  She does not have typical urinary tract symptoms.  Check urine culture in the meantime.  Hold antibiotics for now.  If she has clear dysuria that she will start Cipro in the meantime.  Update me as needed.  See notes on labs.

## 2018-06-01 ENCOUNTER — Ambulatory Visit: Payer: 59 | Admitting: Podiatry

## 2018-06-08 ENCOUNTER — Ambulatory Visit (INDEPENDENT_AMBULATORY_CARE_PROVIDER_SITE_OTHER): Payer: 59 | Admitting: Podiatry

## 2018-06-08 ENCOUNTER — Encounter: Payer: Self-pay | Admitting: Podiatry

## 2018-06-08 DIAGNOSIS — M19079 Primary osteoarthritis, unspecified ankle and foot: Secondary | ICD-10-CM

## 2018-06-08 DIAGNOSIS — M79671 Pain in right foot: Secondary | ICD-10-CM

## 2018-06-08 NOTE — Progress Notes (Signed)
She presents today for follow-up of pain to her right heel states that is excruciating and is not affecting her ability to perform her daily activities including her work and her social life.  She is also having severe pain about the first metatarsophalangeal joint.  Objective: Vital signs are stable she is alert and oriented x3 large nodular mass firm in nature dorsal aspect of the first metatarsal phalangeal joint appears to be hallux limitus but is exquisitely tender on palpation.  She also has severe pain and appears to have a loss of the medial band of the plantar fascia.  It feels that it may have been ruptured.  Assessment: Plantar fascial rupture or chronic plantar fascial inflammation.  Also capsulitis and osteoarthritis first metatarsophalangeal joint.  Plan: At this point requesting MRI of the forefoot and ankle for surgical evaluation.  Follow-up with her with those results.

## 2018-06-09 ENCOUNTER — Telehealth: Payer: Self-pay

## 2018-06-09 NOTE — Telephone Encounter (Signed)
MRI, CPT [id number] approved from 06/09/18 to 07/24/18  Auth# [id number]     CPT 72094 approved from 06/09/18 to 07/24/18   Auth#  [id number]  Patient has been notified of approvals and she will call scheduling to set up her appt

## 2018-06-24 ENCOUNTER — Ambulatory Visit
Admission: RE | Admit: 2018-06-24 | Discharge: 2018-06-24 | Disposition: A | Discharge status: Home / Self Care | Payer: 59 | Source: Ambulatory Visit | Attending: Podiatry | Admitting: Podiatry

## 2018-06-24 DIAGNOSIS — M19079 Primary osteoarthritis, unspecified ankle and foot: Secondary | ICD-10-CM

## 2018-06-24 DIAGNOSIS — M79671 Pain in right foot: Secondary | ICD-10-CM

## 2018-06-30 ENCOUNTER — Ambulatory Visit
Admission: RE | Admit: 2018-06-30 | Discharge: 2018-06-30 | Disposition: A | Discharge status: Home / Self Care | Payer: 59 | Source: Ambulatory Visit | Attending: Podiatry | Admitting: Podiatry

## 2018-06-30 DIAGNOSIS — M722 Plantar fascial fibromatosis: Secondary | ICD-10-CM | POA: Insufficient documentation

## 2018-06-30 DIAGNOSIS — M19071 Primary osteoarthritis, right ankle and foot: Secondary | ICD-10-CM | POA: Insufficient documentation

## 2018-06-30 DIAGNOSIS — M19079 Primary osteoarthritis, unspecified ankle and foot: Secondary | ICD-10-CM | POA: Diagnosis present

## 2018-06-30 DIAGNOSIS — M79671 Pain in right foot: Secondary | ICD-10-CM | POA: Diagnosis present

## 2018-07-20 ENCOUNTER — Telehealth: Payer: Self-pay | Admitting: Family Medicine

## 2018-07-20 MED ORDER — PANTOPRAZOLE SODIUM 40 MG PO TBEC: 40.0000 mg | DELAYED_RELEASE_TABLET | Freq: Every day | ORAL | 1 refills | Status: DC

## 2018-07-20 NOTE — Addendum Note (Signed)
Addended by: Carter Kitten on: 07/20/2018 12:58 PM   Modules accepted: Orders

## 2018-07-20 NOTE — Telephone Encounter (Signed)
Copied from [place] [phone]. Topic: Quick Communication - Rx Refill/Question >> Jul 20, 2018  9:28 AM Celedonio Savage L wrote: Medication: omeprazole and nexium pt want something stronger for acid reflex  Has the patient contacted their pharmacy? No. Medicine not in current use use to be on this medicine (Agent: If no, request that the patient contact the pharmacy for the refill.) (Agent: If yes, when and what did the pharmacy advise?) medicine not in chart  Preferred Pharmacy (with phone number or street name): [facility] [place], [place] [phone] (Phone) [phone] (Fax)    Agent: Please be advised that RX refills may take up to 3 business days. We ask that you follow-up with your pharmacy.

## 2018-07-20 NOTE — Telephone Encounter (Signed)
Lacey Decker notified as instructed by telephone. She denies any exertional chest pain or SOB.  She would like to try to Protonix.  [facility] as instructed by Dr. Diona Browner.

## 2018-07-20 NOTE — Telephone Encounter (Signed)
Contacted pt regarding symptoms; she says that she is having a problem with acid reflux and the ranitidine is not helping; the pt also says that she has tried OTC strength; she would like for a prescription strength medication be sent [facility] [address], [place]; pt last seen in office 01/27/18 spoke with [place] at [facility] and she requests that this information be sent to office for provider review; the pt can be contacted at [phone].

## 2018-07-20 NOTE — Telephone Encounter (Signed)
Make sure she is not having exertional chest pain and shortness of breath. If more traditional reflux symptoms.. Has she tried pantoprazole in past? If not.. Call in 40 mg daily of pantoprazole #30  1 RF. If not improving after 2-3 weeks.. Recommend referral  to GI for further eval.

## 2018-07-25 ENCOUNTER — Telehealth: Payer: 59 | Admitting: Family

## 2018-07-25 DIAGNOSIS — L237 Allergic contact dermatitis due to plants, except food: Secondary | ICD-10-CM

## 2018-07-25 MED ORDER — PREDNISONE 10 MG (21) PO TBPK
ORAL_TABLET | ORAL | 0 refills | Status: DC
Start: 2018-07-25 — End: 2018-09-02

## 2018-07-25 NOTE — Progress Notes (Signed)

## 2018-08-03 ENCOUNTER — Ambulatory Visit: Payer: 59 | Admitting: Podiatry

## 2018-08-08 ENCOUNTER — Encounter: Payer: Self-pay | Admitting: Podiatry

## 2018-08-08 ENCOUNTER — Ambulatory Visit (INDEPENDENT_AMBULATORY_CARE_PROVIDER_SITE_OTHER): Payer: 59 | Admitting: Podiatry

## 2018-08-08 DIAGNOSIS — M722 Plantar fascial fibromatosis: Secondary | ICD-10-CM | POA: Diagnosis not present

## 2018-08-08 NOTE — Progress Notes (Signed)
She presents today for follow-up of her MRI of her right foot.  States that the right foot is just giving her for particularly the first metatarsophalangeal joint.  Objective: Vital signs are stable alert and oriented x3.  Pulses are palpable.  MRI does demonstrate osteoarthritis of the first metatarsophalangeal joint and chronic proximal plantar fasciitis without tear right foot.  Assessment: Letter fasciitis osteoarthritis.  Plan: Discussed etiology pathology conservative therapies at this point she is going to follow-up with Liliane Channel for a set of orthotics.  Liliane Channel she needs an orthotic typical for plantar fasciitis however she needs something to help prevent range of motion of the first metatarsophalangeal joint and to help the tenderness beneath the joint itself.

## 2018-08-10 ENCOUNTER — Ambulatory Visit (INDEPENDENT_AMBULATORY_CARE_PROVIDER_SITE_OTHER): Payer: 59 | Admitting: Orthotics

## 2018-08-10 DIAGNOSIS — M19072 Primary osteoarthritis, left ankle and foot: Secondary | ICD-10-CM | POA: Diagnosis not present

## 2018-08-10 DIAGNOSIS — M19079 Primary osteoarthritis, unspecified ankle and foot: Secondary | ICD-10-CM

## 2018-08-10 DIAGNOSIS — M19071 Primary osteoarthritis, right ankle and foot: Secondary | ICD-10-CM

## 2018-08-10 DIAGNOSIS — M79671 Pain in right foot: Secondary | ICD-10-CM

## 2018-08-10 NOTE — Progress Notes (Signed)

## 2018-08-24 ENCOUNTER — Ambulatory Visit: Payer: Self-pay

## 2018-08-24 ENCOUNTER — Other Ambulatory Visit: Payer: Self-pay | Admitting: Obstetrics and Gynecology

## 2018-08-24 DIAGNOSIS — F3281 Premenstrual dysphoric disorder: Secondary | ICD-10-CM

## 2018-08-24 NOTE — Telephone Encounter (Signed)
Pt. called with c/o left neck, shoulder, upper back, and left breast pain.  Reported has been having the pain for about 2 mos.  Denied any injury or straining of the neck or shoulder.  Denied any numbness or weakness in her arms.  Reported she has been using [facility] and Advil for the discomfort of neck, shoulder, and upper back.  Reported the pain in left breast is intermittent, and feels it is related to stress and heartburn.  Reported the heartburn occurs alot with her supper, or if she over eats.  Stated she was started on Protonix, but does not feel it works as well as the Ranitidine, for her reflux/ indigestion. Reported she is taking the Ranitidine now, and not taking the Protonix.  Denied any chest pain or shortness of breath with exertion; stated "no it's not like that."  Reported she has been experiencing night sweats for past 2 weeks. Denied nausea or sweating associated with left breast pain.  Appt. scheduled with PCP for 08/25/18.  Care advice given per protocol.  Advised to call back if symptoms worsen, or if chest pain becomes continuous, or has associated nausea, SOB, or radiation of the chest pain.  Verb. Understanding.  Agrees with plan.      Reason for Disposition . Neck pain present > 2 weeks  Answer Assessment - Initial Assessment Questions 1. ONSET: "When did the pain begin?"      2 mos.  2. LOCATION: "Where does it hurt?"     Left shoulder, neck, left breast area, and upper left back  3. PATTERN "Does the pain come and go, or has it been constant since it started?"      constant 4. SEVERITY: "How bad is the pain?"  (Scale 1-10; or mild, moderate, severe)   - MILD (1-3): doesn't interfere with normal activities    - MODERATE (4-7): interferes with normal activities or awakens from sleep    - SEVERE (8-10):  excruciating pain, unable to do any normal activities      Varies; today 6/10 5. RADIATION: "Does the pain go anywhere else, shoot into your arms?"     denied 6. CORD  SYMPTOMS: "Any weakness or numbness of the arms or legs?"     Denied numbness or weakness in arms 7. CAUSE: "What do you think is causing the neck pain?"    No  8. NECK OVERUSE: "Any recent activities that involved turning or twisting the neck?"     Denied  9. OTHER SYMPTOMS: "Do you have any other symptoms?" (e.g., headache, fever, chest pain, difficulty breathing, neck swelling)     Feels very hot/ sweats especially at night x 2 wks.  ; intermittent left breast pain ; denied shortness of breath  10. PREGNANCY: "Is there any chance you are pregnant?" "When was your last menstrual period?"      Hysterectomy 8 yrs ago  Protocols used: NECK PAIN OR STIFFNESS-A-AH

## 2018-08-25 ENCOUNTER — Ambulatory Visit: Payer: Self-pay | Admitting: Family Medicine

## 2018-08-25 DIAGNOSIS — Z0289 Encounter for other administrative examinations: Secondary | ICD-10-CM

## 2018-09-02 ENCOUNTER — Encounter: Payer: Self-pay | Admitting: Family Medicine

## 2018-09-02 ENCOUNTER — Ambulatory Visit (INDEPENDENT_AMBULATORY_CARE_PROVIDER_SITE_OTHER): Payer: 59 | Admitting: Family Medicine

## 2018-09-02 DIAGNOSIS — R21 Rash and other nonspecific skin eruption: Secondary | ICD-10-CM | POA: Diagnosis not present

## 2018-09-02 DIAGNOSIS — M542 Cervicalgia: Secondary | ICD-10-CM

## 2018-09-02 DIAGNOSIS — Z23 Encounter for immunization: Secondary | ICD-10-CM

## 2018-09-02 DIAGNOSIS — R0789 Other chest pain: Secondary | ICD-10-CM | POA: Diagnosis not present

## 2018-09-02 MED ORDER — DICLOFENAC SODIUM 1 % TD GEL: 2.0000 g | Freq: Four times a day (QID) | TRANSDERMAL | 3 refills | Status: DC

## 2018-09-02 MED ORDER — TRIAMCINOLONE ACETONIDE 0.1 % EX CREA: 1.0000 "application " | TOPICAL_CREAM | Freq: Two times a day (BID) | CUTANEOUS | 1 refills | Status: DC

## 2018-09-02 MED ORDER — CYCLOBENZAPRINE HCL 10 MG PO TABS: 10.0000 mg | ORAL_TABLET | Freq: Every evening | ORAL | 0 refills | Status: DC | PRN

## 2018-09-02 NOTE — Assessment & Plan Note (Signed)
Most likely MSK strain and tension. Treat with topical  NSAID and muscle relaxant. Continue massage and home PT.

## 2018-09-02 NOTE — Progress Notes (Signed)
Subjective:    Patient ID: Lacey Decker, female    DOB: 07-Aug-1971, 47 y.o.   MRN: [id number]  HPI   47 year old female presents with new onset pain in left upper back  And left neck painin last 2 months.  Has been treating with massage, heat, voltaren gel, icy hot, occ ibuprofen, flexeril does not help much but helps he sleep.  No recent falls, or change in activity.. But had more pain after.   No radiation of pain in to left arm, no numbness and no weakness.  No exertional chest pain, no SOB.  Pain is improved with lifting left breast up. No breast pain but pain above left breast.   New rash under left breast x 7 days... No pain, slightly itchy. No increase in size.  HAs not treated with anything.   Issues with GERD.Marland Kitchen protonix not helping.Marland Kitchen She went back to ranitidine.. Taking once daily.   Mammogram  03/2018 nml. Review of Systems  Constitutional: Negative for fatigue and fever.  HENT: Negative for congestion.   Eyes: Negative for pain.  Respiratory: Negative for cough and shortness of breath.   Cardiovascular: Negative for chest pain, palpitations and leg swelling.  Gastrointestinal: Negative for abdominal pain.  Genitourinary: Negative for dysuria and vaginal bleeding.  Musculoskeletal: Positive for neck pain.  Neurological: Negative for syncope, light-headedness and headaches.  Psychiatric/Behavioral: Negative for dysphoric mood.       Objective:   Physical Exam  Constitutional: Vital signs are normal. She appears well-developed and well-nourished. She is cooperative.  Non-toxic appearance. She does not appear ill. No distress.  HENT:  Head: Normocephalic.  Right Ear: Hearing, tympanic membrane, external ear and ear canal normal. Tympanic membrane is not erythematous, not retracted and not bulging.  Left Ear: Hearing, tympanic membrane, external ear and ear canal normal. Tympanic membrane is not erythematous, not retracted and not bulging.  Nose: No mucosal edema or  rhinorrhea. Right sinus exhibits no maxillary sinus tenderness and no frontal sinus tenderness. Left sinus exhibits no maxillary sinus tenderness and no frontal sinus tenderness.  Mouth/Throat: Uvula is midline, oropharynx is clear and moist and mucous membranes are normal.  Eyes: Pupils are equal, round, and reactive to light. Conjunctivae, EOM and lids are normal. Lids are everted and swept, no foreign bodies found.  Neck: Trachea normal and normal range of motion. Neck supple. Carotid bruit is not present. No thyroid mass and no thyromegaly present.  Cardiovascular: Normal rate, regular rhythm, S1 normal, S2 normal, normal heart sounds, intact distal pulses and normal pulses. Exam reveals no gallop and no friction rub.  No murmur heard. Pulmonary/Chest: Effort normal and breath sounds normal. No tachypnea. No respiratory distress. She has no decreased breath sounds. She has no wheezes. She has no rhonchi. She has no rales. Right breast exhibits no inverted nipple, no mass, no nipple discharge, no skin change and no tenderness. Left breast exhibits skin change. Left breast exhibits no inverted nipple, no mass, no nipple discharge and no tenderness. There is breast tenderness.  ttp over circled area    Abdominal: Soft. Normal appearance and bowel sounds are normal. There is no tenderness.  Neurological: She is alert.  Skin: Skin is warm, dry and intact. No rash noted.  1 cm diameter oblong macule, dry flaky, no central clearing  Under left breast  Psychiatric: Her speech is normal and behavior is normal. Judgment and thought content normal. Her mood appears not anxious. Cognition and memory are normal. She  does not exhibit a depressed mood.          Assessment & Plan:

## 2018-09-02 NOTE — Assessment & Plan Note (Signed)
Not clearly breast pain. Recent nml mammo.. If not improving, consider repeat mammogram/US.

## 2018-09-02 NOTE — Assessment & Plan Note (Signed)
Most consistent with eczema. Treat with topical steroid cream.

## 2018-09-02 NOTE — Patient Instructions (Addendum)
Increase ranitidine back  to twice daily.  Zpply topical steroid to rash under breast.  Apply voltaren gel to left neck and shoulder 3-4 times daily.  Use muscle relaxant at night as needed. Call if not improving  For eval of breast with repeat mammogram.

## 2018-09-07 ENCOUNTER — Ambulatory Visit: Payer: 59 | Admitting: Orthotics

## 2018-09-07 DIAGNOSIS — M722 Plantar fascial fibromatosis: Secondary | ICD-10-CM

## 2018-09-07 DIAGNOSIS — M19079 Primary osteoarthritis, unspecified ankle and foot: Secondary | ICD-10-CM

## 2018-09-07 NOTE — Progress Notes (Signed)
Patient came in today to pick up custom made foot orthotics.  The goals were accomplished and the patient reported no dissatisfaction with said orthotics.  Patient was advised of breakin period and how to report any issues. 

## 2018-09-17 ENCOUNTER — Other Ambulatory Visit: Payer: Self-pay | Admitting: Family Medicine

## 2018-09-19 ENCOUNTER — Telehealth: Payer: Self-pay | Admitting: Podiatry

## 2018-09-19 NOTE — Telephone Encounter (Signed)
Pt left message for me to call her back she has a question about the orthotics..   I returned call and the orthotics were made for a size 9 or 9.5 shoe but when wearing that size it hurts her great toe. She has went up to a size 11 and is asking if that would be ok for her to wear the orthotics in. She said with this size she is not having toe pain. She stated that the heel pain has never gone away.

## 2018-10-13 ENCOUNTER — Encounter: Payer: Self-pay | Admitting: Podiatry

## 2018-10-13 ENCOUNTER — Ambulatory Visit (INDEPENDENT_AMBULATORY_CARE_PROVIDER_SITE_OTHER): Payer: 59 | Admitting: Podiatry

## 2018-10-13 DIAGNOSIS — M2011 Hallux valgus (acquired), right foot: Secondary | ICD-10-CM | POA: Diagnosis not present

## 2018-10-13 DIAGNOSIS — M722 Plantar fascial fibromatosis: Secondary | ICD-10-CM | POA: Diagnosis not present

## 2018-10-13 NOTE — Patient Instructions (Signed)
Pre-Operative Instructions  Congratulations, you have decided to take an important step towards improving your quality of life.  You can be assured that the doctors and staff at [facility] will be with you every step of the way.  Here are some important things you should know:  1. Plan to be at the surgery center/hospital at least 1 (one) hour prior to your scheduled time, unless otherwise directed by the surgical center/hospital staff.  You must have a responsible adult accompany you, remain during the surgery and drive you home.  Make sure you have directions to the surgical center/hospital to ensure you arrive on time. 2. If you are having surgery at [facility] or [facility] hospitals, you will need a copy of your medical history and physical form from your family physician within one month prior to the date of surgery. We will give you a form for your primary physician to complete.  3. We make every effort to accommodate the date you request for surgery.  However, there are times where surgery dates or times have to be moved.  We will contact you as soon as possible if a change in schedule is required.   4. No aspirin/ibuprofen for one week before surgery.  If you are on aspirin, any non-steroidal anti-inflammatory medications (Mobic, Aleve, Ibuprofen) should not be taken seven (7) days prior to your surgery.  You make take Tylenol for pain prior to surgery.  5. Medications - If you are taking daily heart and blood pressure medications, seizure, reflux, allergy, asthma, anxiety, pain or diabetes medications, make sure you notify the surgery center/hospital before the day of surgery so they can tell you which medications you should take or avoid the day of surgery. 6. No food or drink after midnight the night before surgery unless directed otherwise by surgical center/hospital staff. 7. No alcoholic beverages 24-hours prior to surgery.  No smoking 24-hours prior or 24-hours after  surgery. 8. Wear loose pants or shorts. They should be loose enough to fit over bandages, boots, and casts. 9. Don't wear slip-on shoes. Sneakers are preferred. 10. Bring your boot with you to the surgery center/hospital.  Also bring crutches or a walker if your physician has prescribed it for you.  If you do not have this equipment, it will be provided for you after surgery. 11. If you have not been contacted by the surgery center/hospital by the day before your surgery, call to confirm the date and time of your surgery. 12. Leave-time from work may vary depending on the type of surgery you have.  Appropriate arrangements should be made prior to surgery with your employer. 13. Prescriptions will be provided immediately following surgery by your doctor.  Fill these as soon as possible after surgery and take the medication as directed. Pain medications will not be refilled on weekends and must be approved by the doctor. 14. Remove nail polish on the operative foot and avoid getting pedicures prior to surgery. 15. Wash the night before surgery.  The night before surgery wash the foot and leg well with water and the antibacterial soap provided. Be sure to pay special attention to beneath the toenails and in between the toes.  Wash for at least three (3) minutes. Rinse thoroughly with water and dry well with a towel.  Perform this wash unless told not to do so by your physician.  Enclosed: 1 Ice pack (please put in freezer the night before surgery)   1 Hibiclens skin cleaner     Pre-op instructions  If you have any questions regarding the instructions, please do not hesitate to call our office.  [place]: [address], [place], [place] [address] -- [phone]  [place]: [address] [place], [place] [address] -- [phone]  [place]: [address]  , [place] [address] -- [phone]  [place]: [address], [address], [place],  [address] -- [phone]  Website:  [email or web address] 

## 2018-10-13 NOTE — Progress Notes (Signed)
She presents today chief complaint of painful right foot and heel.  She states that the orthotics really did not help with anything they made the foot worse.  She states that not only did the make the right foot hurt but the left foot started to go numb in the heel.  She states that she consists terribly concerned about developing neuropathy due to wearing these orthotics.  Objective: I have reviewed her past medical history medications allergy surgeries social history review of systems.  Pulses are strongly palpable neurologic sensorium is intact.  Degenerative flexors are intact.  Muscle strength is normal symmetrical.  She has pain on palpation medial calcaneal tubercle of the plantar fascia of the right heel as well as pain on palpation and range of motion of the first metatarsophalangeal joint right foot.  Radiographs were reviewed today as well as the MRI demonstrating plantar fasciitis as well as osteoarthritic changes of the first metatarsophalangeal joint.  Assessment: Plantar fasciitis and capsulitis with osteoarthritic changes first metatarsophalangeal joint of the right foot.  Plan: Discussed etiology pathology conservative versus surgical therapies to today consented her for a [name] with osteotomy with screw fixation as well as a endoscopic plantar fasciotomy of the right foot.  She will be using a cast/cam walker for at least 2 weeks 24 hours a day.  We will try to keep her out of work for at least 2 weeks she primarily sits at work she understands that she is will have to keep this elevated to the best of her ability.  We did discuss the possible postop complications which may include but not limited to postop pain bleeding swelling infection recurrence need further surgery overcorrection under correction loss of digit loss of limb loss of life.  Provided her with both oral and written home-going instructions for the morning of preop the surgery center as well as anesthesia group.  She  understands this is amenable to it signed with patient consent form.

## 2018-10-25 ENCOUNTER — Telehealth: Payer: Self-pay | Admitting: *Deleted

## 2018-10-25 ENCOUNTER — Encounter: Payer: Self-pay | Admitting: Podiatry

## 2018-10-25 NOTE — Telephone Encounter (Signed)
I am calling to see if we can move your surgery from November 04, 2018 to December 30.  "No, that will not be good.  I only have time off from December 20 to January 1.  My job will not allow me to have additional time off.  Am I the only one you are asking to move?"  I have others that I have to call as well.  His schedule was already full when they added you on last week, so I am trying to make phone calls to reschedule people.  "I really need to have it done this day because if I have it done on December 30, it will only give me two days to recuperate.  Can you call someone else and see if they can move"  Let me see if I can reschedule someone else.

## 2018-10-26 ENCOUNTER — Ambulatory Visit: Payer: 59 | Admitting: Orthotics

## 2018-10-26 DIAGNOSIS — M2011 Hallux valgus (acquired), right foot: Secondary | ICD-10-CM

## 2018-10-26 DIAGNOSIS — M722 Plantar fascial fibromatosis: Secondary | ICD-10-CM

## 2018-10-31 ENCOUNTER — Other Ambulatory Visit: Payer: Self-pay | Admitting: Podiatry

## 2018-10-31 MED ORDER — TRAMADOL HCL 50 MG PO TABS: 50.0000 mg | ORAL_TABLET | Freq: Four times a day (QID) | ORAL | 0 refills | Status: AC | PRN

## 2018-10-31 MED ORDER — ONDANSETRON HCL 4 MG PO TABS: 4.0000 mg | ORAL_TABLET | Freq: Three times a day (TID) | ORAL | 0 refills | Status: DC | PRN

## 2018-10-31 MED ORDER — DOXYCYCLINE HYCLATE 100 MG PO TABS: 100.0000 mg | ORAL_TABLET | Freq: Two times a day (BID) | ORAL | 0 refills | Status: DC

## 2018-11-04 DIAGNOSIS — M2011 Hallux valgus (acquired), right foot: Secondary | ICD-10-CM

## 2018-11-04 DIAGNOSIS — M722 Plantar fascial fibromatosis: Secondary | ICD-10-CM

## 2018-11-06 ENCOUNTER — Telehealth: Payer: Self-pay | Admitting: Podiatry

## 2018-11-06 ENCOUNTER — Encounter (HOSPITAL_COMMUNITY): Payer: Self-pay | Admitting: Emergency Medicine

## 2018-11-06 ENCOUNTER — Emergency Department (HOSPITAL_COMMUNITY)
Admission: EM | Admit: 2018-11-06 | Discharge: 2018-11-06 | Disposition: A | Discharge status: Home / Self Care | Payer: 59 | Attending: Emergency Medicine | Admitting: Emergency Medicine

## 2018-11-06 DIAGNOSIS — R42 Dizziness and giddiness: Secondary | ICD-10-CM

## 2018-11-06 DIAGNOSIS — Z79899 Other long term (current) drug therapy: Secondary | ICD-10-CM | POA: Diagnosis not present

## 2018-11-06 LAB — CBC WITH DIFFERENTIAL/PLATELET
Abs Immature Granulocytes: 0.02 10*3/uL (ref 0.00–0.07)
Basophils Absolute: 0 10*3/uL (ref 0.0–0.1)
Basophils Relative: 1 %
EOS PCT: 2 %
Eosinophils Absolute: 0.1 10*3/uL (ref 0.0–0.5)
HCT: 36.9 % (ref 36.0–46.0)
Hemoglobin: 11.5 g/dL — ABNORMAL LOW (ref 12.0–15.0)
Immature Granulocytes: 0 %
Lymphocytes Relative: 20 %
Lymphs Abs: 1.6 10*3/uL (ref 0.7–4.0)
MCH: 32.1 pg (ref 26.0–34.0)
MCHC: 31.2 g/dL (ref 30.0–36.0)
MCV: 103.1 fL — ABNORMAL HIGH (ref 80.0–100.0)
Monocytes Absolute: 0.6 10*3/uL (ref 0.1–1.0)
Monocytes Relative: 8 %
NEUTROS PCT: 69 %
Neutro Abs: 5.8 10*3/uL (ref 1.7–7.7)
PLATELETS: 243 10*3/uL (ref 150–400)
RBC: 3.58 MIL/uL — ABNORMAL LOW (ref 3.87–5.11)
RDW: 13.6 % (ref 11.5–15.5)
WBC: 8.3 10*3/uL (ref 4.0–10.5)
nRBC: 0 % (ref 0.0–0.2)

## 2018-11-06 LAB — COMPREHENSIVE METABOLIC PANEL
ALT: 17 U/L (ref 0–44)
AST: 18 U/L (ref 15–41)
Albumin: 3.2 g/dL — ABNORMAL LOW (ref 3.5–5.0)
Alkaline Phosphatase: 39 U/L (ref 38–126)
Anion gap: 5 (ref 5–15)
BUN: 17 mg/dL (ref 6–20)
CO2: 25 mmol/L (ref 22–32)
Calcium: 8.4 mg/dL — ABNORMAL LOW (ref 8.9–10.3)
Chloride: 110 mmol/L (ref 98–111)
Creatinine, Ser: 0.85 mg/dL (ref 0.44–1.00)
GFR calc Af Amer: >60 mL/min (ref >60)
GFR calc non Af Amer: >60 mL/min (ref >60)
Glucose, Bld: 97 mg/dL (ref 70–99)
Potassium: 3.9 mmol/L (ref 3.5–5.1)
Sodium: 140 mmol/L (ref 135–145)
Total Bilirubin: 0.6 mg/dL (ref 0.3–1.2)
Total Protein: 5.9 g/dL — ABNORMAL LOW (ref 6.5–8.1)

## 2018-11-06 LAB — I-STAT TROPONIN, ED: Troponin i, poc: 0 ng/mL (ref 0.00–0.08)

## 2018-11-06 MED ORDER — LORAZEPAM 2 MG/ML IJ SOLN
1.0000 mg | Freq: Once | INTRAMUSCULAR | Status: AC
  Administered 2018-11-06: 1 mg via INTRAVENOUS
  Filled 2018-11-06: qty 1

## 2018-11-06 MED ORDER — DIAZEPAM 5 MG PO TABS: 5.0000 mg | ORAL_TABLET | Freq: Three times a day (TID) | ORAL | 0 refills | Status: DC | PRN

## 2018-11-06 MED ORDER — SODIUM CHLORIDE 0.9 % IV BOLUS
1000.0000 mL | Freq: Once | INTRAVENOUS | Status: AC
  Administered 2018-11-06: 1000 mL via INTRAVENOUS

## 2018-11-06 MED ORDER — MECLIZINE HCL 25 MG PO TABS: 25.0000 mg | ORAL_TABLET | Freq: Three times a day (TID) | ORAL | 0 refills | Status: DC | PRN

## 2018-11-06 MED ORDER — IBUPROFEN 800 MG PO TABS
800.0000 mg | ORAL_TABLET | Freq: Once | ORAL | Status: AC
  Administered 2018-11-06: 800 mg via ORAL
  Filled 2018-11-06: qty 1

## 2018-11-06 MED ORDER — MECLIZINE HCL 25 MG PO TABS
25.0000 mg | ORAL_TABLET | Freq: Once | ORAL | Status: AC
  Administered 2018-11-06: 25 mg via ORAL
  Filled 2018-11-06: qty 1

## 2018-11-06 MED ORDER — ACETAMINOPHEN 325 MG PO TABS
650.0000 mg | ORAL_TABLET | Freq: Once | ORAL | Status: AC
  Administered 2018-11-06: 650 mg via ORAL
  Filled 2018-11-06: qty 2

## 2018-11-06 NOTE — ED Provider Notes (Signed)
[facility] Provider Note   CSN: [id number] Arrival date & time: 11/06/18  1147     History   Chief Complaint Chief Complaint  Patient presents with  . Foot Pain    HPI Lacey Decker is a 47 y.o. female.  The history is provided by the patient and medical records. No language interpreter was used.   Lacey Decker is a 47 y.o. with hx of anxiety, bipolar disorder, prior MRSA + culture who presents to ER for dizziness. Patient underwent surgical procedure for her plantar fasciitis and bunion by Dr. Milinda Pointer on 12/27.  That night, she took her ibuprofen, Tylenol and a tramadol.  About 45 minutes later, she went to sleep.  She closed her eyes when she started developing room spinning dizziness.  She felt warm and flushed when this was occurring.  Lasted about 45 minutes.  Every time she closed her eyes, her symptoms would get worse.  It would get better when she sat up and kept her eyes open.  She decided not to go to sleep, because she was scared something bad may happen to her in her sleep.  She called the doctor who stated this could be due to her tramadol and recommended that she just continue ibuprofen and Tylenol.  She did that the following day (yesterday).  When she went to bed again last night, she developed the same vertiginous symptoms of room spinning sensation and flushed feeling.  She is worried this could be due to her antibiotic, doxycycline, however she is not having this feeling after she takes her a.m. dose of antibiotics.  She is requesting to be switched because she is scared that continuing to take doxycycline could kill her.  She of note does have allergies to Augmentin, clindamycin and Bactrim.  She states that her podiatrist told her there was not really another option for antibiotic choice given her lengthy list of allergies.  She called her podiatrist who recommended that she come to the ER for further evaluation given her dizziness.   No fever.  Feels as if the pain of her foot is fairly well controlled.  Past Medical History:  Diagnosis Date  . Adenomyosis   . Alcohol abuse    last drink one year ago  . Anxiety   . Bipolar 1 disorder ([place])   . Chlamydia    as a teen  . Endometriosis   . MRSA (methicillin resistant staph aureus) culture positive   . PMDD (premenstrual dysphoric disorder)   . SUI (stress urinary incontinence, female)     Patient Active Problem List   Diagnosis Date Noted  . Neck pain on left side 09/02/2018  . Anterior chest wall pain 09/02/2018  . Urinary frequency 05/08/2018  . Urinary incontinence without sensory awareness 02/08/2018  . BV (bacterial vaginosis) 02/08/2018  . PMDD (premenstrual dysphoric disorder) 02/08/2018  . SUI (stress urinary incontinence, female) 02/08/2018  . Urinary incontinence 01/27/2018  . Trapezius strain, left, initial encounter 01/18/2018  . Right elbow pain 10/15/2016  . Cellulitis and abscess 04/07/2016  . Abnormal findings on esophagogastroduodenoscopy (EGD) 10/27/2013  . Hot flashes 08/22/2013  . Other malaise and fatigue 08/22/2013  . Neck pain on right side 07/17/2013  . Medial epicondylitis of left elbow 07/17/2013  . Right sided abdominal pain 01/02/2013  . Rash 12/02/2011  . OBESITY, UNSPECIFIED 11/21/2010  . Allergic rhinitis 11/21/2010  . MIGRAINE, COMMON, INTRACTABLE 01/29/2009  . MRSA 11/24/2008  . VERTEBROBASILAR INSUFFICIENCY 07/08/2007  .  Bipolar disorder ([place]) 01/26/2007  . Generalized anxiety disorder 01/06/2007  . PANIC ATTACKS 01/06/2007  . Esophageal reflux 01/06/2007  . DYSFUNCTIONAL UTERINE BLEEDING 01/06/2007  . ROSACEA 01/06/2007    Past Surgical History:  Procedure Laterality Date  . ABDOMINAL HYSTERECTOMY  2009   total lap hyst, LSO due to adenomyosis/endometriosis  . btl  11/09/1998  . DILATION AND CURETTAGE OF UTERUS  1997   after miscarriage  . LEEP  2000  . misscarriage     x5  . NEUROMA SURGERY     lipoma  removal Right labia  . nsvd     3     OB History    Gravida  10   Para  3   Term  3   Preterm      AB  5   Living  3     SAB  5   TAB      Ectopic      Multiple      Live Births               Home Medications    Prior to Admission medications   Medication Sig Start Date End Date Taking? Authorizing Provider  cetirizine (ZYRTEC) 10 MG tablet Take 10 mg by mouth daily.    [provider]  cyclobenzaprine (FLEXERIL) 10 MG tablet Take 1 tablet (10 mg total) by mouth at bedtime as needed for muscle spasms. 09/02/18   Bedsole, Amy E, MD  diazepam (VALIUM) 5 MG tablet Take 1 tablet (5 mg total) by mouth every 8 (eight) hours as needed for anxiety. 11/06/18   Ward, Ozella Almond, PA-C  diclofenac sodium (VOLTAREN) 1 % GEL Apply 2 g topically 4 (four) times daily. 09/02/18   Bedsole, Amy E, MD  doxycycline (VIBRA-TABS) 100 MG tablet Take 1 tablet (100 mg total) by mouth 2 (two) times daily. 10/31/18   Hyatt, Max T, DPM  fluticasone (FLONASE) 50 MCG/ACT nasal spray Place 2 sprays into both nostrils daily. 01/18/18   Bedsole, Amy E, MD  LORazepam (ATIVAN) 0.5 MG tablet TAKE 1 TABLET (0.5 MG TOTAL) BY MOUTH EVERY 8 (EIGHT) HOURS 66/44/03   Copland, Deirdre Evener, PA-C  meclizine (ANTIVERT) 25 MG tablet Take 1 tablet (25 mg total) by mouth 3 (three) times daily as needed for dizziness. 11/06/18   Ward, Ozella Almond, PA-C  ondansetron (ZOFRAN) 4 MG tablet Take 1 tablet (4 mg total) by mouth every 8 (eight) hours as needed for nausea or vomiting. 10/31/18   Hyatt, Max T, DPM  pantoprazole (PROTONIX) 40 MG tablet TAKE 1 TABLET BY MOUTH EVERY DAY 09/18/18   Bedsole, Amy E, MD  ranitidine (ZANTAC) 150 MG tablet Take 150 mg by mouth 2 (two) times daily.    [provider]  traMADol (ULTRAM) 50 MG tablet Take 1 tablet (50 mg total) by mouth every 6 (six) hours as needed for up to 7 days. 10/31/18 11/07/18  Hyatt, Max T, DPM  triamcinolone cream (KENALOG) 0.1 % Apply 1  application topically 2 (two) times daily. 09/02/18   [name], MD    Family History Family History  Problem Relation Age of Onset  . Hypertension Mother   . Hyperlipidemia Mother   . Depression Mother   . Bipolar disorder Sister   . Depression Maternal Grandmother   . Breast cancer Neg Hx     Social History Social History   Tobacco Use  . Smoking status: Never Smoker  . Smokeless tobacco: Never  Used  Substance Use Topics  . Alcohol use: No    Alcohol/week: 0.0 standard drinks  . Drug use: No     Allergies   Codeine; Morphine; Augmentin [amoxicillin-pot clavulanate]; Clarithromycin; Erythromycin base; and Sulfamethoxazole-trimethoprim   Review of Systems Review of Systems  Neurological: Positive for dizziness and light-headedness. Negative for syncope, speech difficulty and headaches.  All other systems reviewed and are negative.    Physical Exam Updated Vital Signs BP 131/77   Pulse 62   Temp 97.9 F (36.6 C) (Oral)   Resp 10   SpO2 98%   Physical Exam Vitals signs and nursing note reviewed.  Constitutional:      General: She is not in acute distress.    Appearance: She is well-developed.  HENT:     Head: Normocephalic and atraumatic.  Cardiovascular:     Rate and Rhythm: Normal rate and regular rhythm.     Heart sounds: Normal heart sounds. No murmur.  Pulmonary:     Effort: Pulmonary effort is normal. No respiratory distress.     Breath sounds: Normal breath sounds.  Abdominal:     General: There is no distension.     Palpations: Abdomen is soft.     Tenderness: There is no abdominal tenderness.  Skin:    General: Skin is warm and dry.     Comments: Surgical wounds to right lower extremity with suturing in place.  They are clean, dry and intact without signs of infection. Good cap refill. 2+ DP.  Neurological:     Mental Status: She is alert and oriented to person, place, and time.     Comments: Alert, oriented, thought content  appropriate, able to give a coherent history. Speech is clear and goal oriented, able to follow commands.  Cranial Nerves:  II:  Peripheral visual fields grossly normal, pupils equal, round, reactive to light III, IV, VI: EOM intact bilaterally, ptosis not present V,VII: smile symmetric, eyes kept closed tightly against resistance, facial light touch sensation equal VIII: hearing grossly normal IX, X: symmetric soft palate movement, uvula elevates symmetrically  XI: bilateral shoulder shrug symmetric and strong XII: midline tongue extension 5/5 muscle strength in upper and lower extremities bilaterally including strong and equal grip strength and dorsiflexion/plantar flexion Sensory to light touch normal in all four extremities.      ED Treatments / Results  Labs (all labs ordered are listed, but only abnormal results are displayed) Labs Reviewed  CBC WITH DIFFERENTIAL/PLATELET - Abnormal; Notable for the following components:      Result Value   RBC 3.58 (*)    Hemoglobin 11.5 (*)    MCV 103.1 (*)    All other components within normal limits  COMPREHENSIVE METABOLIC PANEL - Abnormal; Notable for the following components:   Calcium 8.4 (*)    Total Protein 5.9 (*)    Albumin 3.2 (*)    All other components within normal limits  I-STAT TROPONIN, ED    EKG EKG Interpretation  Date/Time:  Sunday November 06 2018 11:59:57 EST Ventricular Rate:  66 PR Interval:    QRS Duration: 93 QT Interval:  392 QTC Calculation: 411 R Axis:   104 Text Interpretation:  Right and left arm electrode reversal, interpretation assumes no reversal Sinus or ectopic atrial rhythm Probable lateral infarct, old Confirmed by Lacretia Leigh ([id number]) on 11/06/2018 1:39:00 PM   Radiology No results found.  Procedures Procedures (including critical care time)  Medications Ordered in ED Medications  meclizine (ANTIVERT) tablet 25  mg (25 mg Oral Given 11/06/18 1241)  sodium chloride 0.9 % bolus  1,000 mL (1,000 mLs Intravenous New Bag/Given 11/06/18 1242)  LORazepam (ATIVAN) injection 1 mg (1 mg Intravenous Given 11/06/18 1242)  ibuprofen (ADVIL,MOTRIN) tablet 800 mg (800 mg Oral Given 11/06/18 1241)  acetaminophen (TYLENOL) tablet 650 mg (650 mg Oral Given 11/06/18 1241)     Initial Impression / Assessment and Plan / ED Course  I have reviewed the triage vital signs and the nursing notes.  Pertinent labs & imaging results that were available during my care of the patient were reviewed by me and considered in my medical decision making (see chart for details).    Lacey Decker is a 47 y.o. female who presents to ED for dizziness described as room spinning sensation which sounds vertiginous in nature along with feeling anxious after having procedure to her right foot.  Surgical wounds on the foot appear c/d/i without signs of infection.  She is requesting crutches to help with pain control which were provided.  She has a follow-up appointment for her foot with podiatry on Tuesday. No pathology of the foot that would require ongoing emergent intervention or inpatient treatment.  Encouraged to keep her appointment with podiatry with further management of her foot pain per podiatry team.  As far as her dizziness is concerned, she has no focal neurologic deficits on exam.  Symptoms worsen when she closes her eyes or lays back flat.  Symptoms have resolved with fluid, meclizine and Ativan in the emergency department.  Does not seem consistent with stroke.  She is concerned that she may be having an allergic reaction to her doxycycline which is causing this dizzy sensation, however this only occurs intermittently and is not happening after her morning dose of doxycycline.  I discussed with her that I did not believe that this was an allergic reaction and given all of her other antibiotic allergies, there was not an adequate substitute antibiotic to switch to that would provide good coverage to  prevent infection.  Informed her that she could hold antibiotic tomorrow and get recommendations from podiatry at her appointment the next day if she would like, but again did not feel like this was an allergic reaction, at best an intolerance.  On reevaluation prior to discharge, she felt improved.  Normal gait.  She feels comfortable with discharged home.  Recommended that she follow-up with her primary care doctor for further evaluation of dizziness.  Given Rx for Valium and meclizine to help with symptoms.  Reasons to return to the emergency department were discussed with patient and husband at bedside.  All questions answered.   Patient discussed with Dr. Zenia Resides who agrees with treatment plan.    Final Clinical Impressions(s) / ED Diagnoses   Final diagnoses:  Dizziness    ED Discharge Orders         Ordered    meclizine (ANTIVERT) 25 MG tablet  3 times daily PRN     11/06/18 1337    diazepam (VALIUM) 5 MG tablet  Every 8 hours PRN     11/06/18 1340           Ward, Ozella Almond, PA-C 11/06/18 1346    Lacretia Leigh, MD 11/07/18 1456

## 2018-11-06 NOTE — Telephone Encounter (Signed)
This patient calls to the office saying she is having many post operative problems.  Fist she says she is unable to take medication for her pain and was told to only take advil and tylenol.  He had surgery by Dr.  Milinda Pointer on 12/27 including EPF and Biplanar [place] Bunionectomy.  She relates she has multiple allergies to her meds. Her biggest concern now  is that she experiences episodes of dizziness with the room spinning and feeling hot.  I could tell she was very nervous and anxious about her post op condition.  I told her to stop taking doxycycline.  She says she has talked to the anesthesia doctors who said the anesthesia meds would already be out of her system.  I recommended she be seen at the ER got her dizziness and hot feeling.  She says there is pain in her foot but she seems concerned about her dizziness.  The ER could also check the foot as well as her person.   Gardiner Barefoot DPM

## 2018-11-06 NOTE — ED Triage Notes (Addendum)
Pt had surgery on Friday. She has multiple allergies to pain meds. She had a block done, did not take Tramadol because she was numb. Block wore off yesterday and now has had dizziness and pain since the block wore off, tearful. Pt has not taken her ibuprofen and tylenol as directed for this dose today. Has not taken her tramadol since Friday night.

## 2018-11-06 NOTE — Discharge Instructions (Signed)
It was my pleasure taking care of you today!   Take Meclizine as needed for dizziness.  Please call your primary care doctor to schedule a follow up appointment to further discuss your dizziness.   Keep your scheduled appointment with your podiatrist.  Return to the emergency department for new or worsening symptoms, any additional concerns.

## 2018-11-06 NOTE — ED Notes (Signed)
Patient verbalized understanding of discharge instructions and denies any further needs or questions at this time. VS stable. Patient ambulatory with steady gait using crutches - assisted to ED entrance in wheelchair.

## 2018-11-08 ENCOUNTER — Ambulatory Visit (INDEPENDENT_AMBULATORY_CARE_PROVIDER_SITE_OTHER): Payer: 59

## 2018-11-08 ENCOUNTER — Encounter: Payer: Self-pay | Admitting: Podiatry

## 2018-11-08 ENCOUNTER — Ambulatory Visit (INDEPENDENT_AMBULATORY_CARE_PROVIDER_SITE_OTHER): Payer: 59 | Admitting: Podiatry

## 2018-11-08 VITALS — BP 144/85 | HR 58 | Temp 96.5°F

## 2018-11-08 DIAGNOSIS — M2011 Hallux valgus (acquired), right foot: Secondary | ICD-10-CM | POA: Diagnosis not present

## 2018-11-08 DIAGNOSIS — Z9889 Other specified postprocedural states: Secondary | ICD-10-CM

## 2018-11-08 MED ORDER — POLYETHYLENE GLYCOL 3350 17 GM/SCOOP PO POWD: 17.0000 g | Freq: Two times a day (BID) | ORAL | 1 refills | Status: DC | PRN

## 2018-11-09 NOTE — Progress Notes (Signed)
Met with patient re f/o

## 2018-11-11 NOTE — Progress Notes (Signed)
   Subjective:  Patient presents today status post bunionectomy and EPF right. DOS: 11/04/18. She states she is doing well overall. She reports some mild burning pain around the top incision site. She has been using the CAM boot as directed. She denies modifying factors. Patient is here for further evaluation and treatment.    Past Medical History:  Diagnosis Date  . Adenomyosis   . Alcohol abuse    last drink one year ago  . Anxiety   . Bipolar 1 disorder ([place])   . Chlamydia    as a teen  . Endometriosis   . MRSA (methicillin resistant staph aureus) culture positive   . PMDD (premenstrual dysphoric disorder)   . SUI (stress urinary incontinence, female)       Objective/Physical Exam Neurovascular status intact.  Skin incisions appear to be well coapted with sutures and staples intact. No sign of infectious process noted. No dehiscence. No active bleeding noted. Moderate edema noted to the surgical extremity.  Radiographic Exam:  Orthopedic hardware and osteotomies sites appear to be stable with routine healing.  Assessment: 1. s/p bunionectomy and EPF right. DOS: 11/04/18.    Plan of Care:  1. Patient was evaluated. X-rays reviewed 2. Dressing changed. Keep clean, dry and intact for one week.  3. Continue minimal weightbearing in CAM boot.  4. Discontinue taking Doxycycline and vertigo medications due to constant nausea.  5. Prescription for Dulcolax powder for post op constipation. Patient has not had a bowel movement since surgery.  6. Return to clinic in one week.    Edrick Kins, DPM [facility]  Dr. Edrick Kins, [address]                                        [place],  [address]                Office [phone]  Fax [phone]

## 2018-11-16 ENCOUNTER — Ambulatory Visit (INDEPENDENT_AMBULATORY_CARE_PROVIDER_SITE_OTHER): Payer: 59 | Admitting: Podiatry

## 2018-11-16 ENCOUNTER — Ambulatory Visit: Payer: 59 | Admitting: Orthotics

## 2018-11-16 VITALS — BP 146/86 | HR 73 | Temp 97.8°F

## 2018-11-16 DIAGNOSIS — M2011 Hallux valgus (acquired), right foot: Secondary | ICD-10-CM

## 2018-11-16 DIAGNOSIS — M722 Plantar fascial fibromatosis: Secondary | ICD-10-CM

## 2018-11-16 DIAGNOSIS — Z9889 Other specified postprocedural states: Secondary | ICD-10-CM

## 2018-11-16 NOTE — Progress Notes (Signed)
Patient came in today to pick up custom made foot orthotics.  The goals were accomplished and the patient reported no dissatisfaction with said orthotics.  Patient was advised of breakin period and how to report any issues. 

## 2018-11-16 NOTE — Progress Notes (Signed)
She presents today for postop visit date of surgery 11/04/2018 status post [name] with bunionectomy with screw fixation right foot EPF right and she has had the latter last night the pain woke me up and it was exquisitely painful.  Objective: Vital signs are stable alert oriented x3.  There is no erythema mild edema no cellulitis drainage odor great range of motion of the first metatarsal phalangeal joint no pain on palpation of the medial heel.  Sutures are intact margins well coapted redressed today dressed a compressive dressing.  Assessment: Healing surgical foot redressed today follow-up with her in 2 weeks suture removal.

## 2018-11-30 ENCOUNTER — Encounter: Payer: 59 | Admitting: Podiatry

## 2018-11-30 ENCOUNTER — Ambulatory Visit (INDEPENDENT_AMBULATORY_CARE_PROVIDER_SITE_OTHER): Payer: 59 | Admitting: Podiatry

## 2018-11-30 ENCOUNTER — Encounter: Payer: Self-pay | Admitting: Podiatry

## 2018-11-30 ENCOUNTER — Ambulatory Visit (INDEPENDENT_AMBULATORY_CARE_PROVIDER_SITE_OTHER): Payer: 59

## 2018-11-30 DIAGNOSIS — M2011 Hallux valgus (acquired), right foot: Secondary | ICD-10-CM

## 2018-11-30 DIAGNOSIS — Z9889 Other specified postprocedural states: Secondary | ICD-10-CM

## 2018-11-30 DIAGNOSIS — M722 Plantar fascial fibromatosis: Secondary | ICD-10-CM

## 2018-11-30 NOTE — Progress Notes (Signed)
She presents today for postop visit #3 date of surgery 11/04/2018 status post [name] with osteotomy and endoscopic fasciotomy.  She states that is better but is sore at times.  She states that she has been walking on a short distances around the house without the boot or the shoe on.  States that she does not like wearing the blue shoe at night because it makes her big toe hurt.  Objective: Vital signs are stable alert and oriented x3.  There is no erythema edema cellulitis drainage or odor she has good range of motion of the first metatarsophalangeal joint though tender.  Radiographic evaluation demonstrates well-healing osteotomy internal fixation is in good position.  Assessment: Well-healing surgical foot.  Plan: Redressed today with her Darco shoe I will follow-up with her in 2 weeks at which time we hope to get into her regular tennis shoe.

## 2018-12-14 ENCOUNTER — Encounter: Payer: 59 | Admitting: Podiatry

## 2018-12-14 ENCOUNTER — Ambulatory Visit (INDEPENDENT_AMBULATORY_CARE_PROVIDER_SITE_OTHER): Payer: 59

## 2018-12-14 ENCOUNTER — Ambulatory Visit (INDEPENDENT_AMBULATORY_CARE_PROVIDER_SITE_OTHER): Payer: 59 | Admitting: Podiatry

## 2018-12-14 DIAGNOSIS — M722 Plantar fascial fibromatosis: Secondary | ICD-10-CM

## 2018-12-14 DIAGNOSIS — M2011 Hallux valgus (acquired), right foot: Secondary | ICD-10-CM

## 2018-12-14 DIAGNOSIS — Z9889 Other specified postprocedural states: Secondary | ICD-10-CM

## 2018-12-15 NOTE — Progress Notes (Signed)
She presents today date of surgery November 04, 2018 status post [name] with bunionectomy with screw and and EPF right foot.  States that the heel is doing terrific however she still has a little bit of tenderness and numbness around the first metatarsophalangeal joint with mild edema.  She states that really over the last couple of days it has started to do very well.  She denies fever chills nausea vomiting muscle aches pains calf pain back pain chest pain shortness of breath.  Objective: Vital signs are stable alert and oriented x3.  Pulses are palpable.  She has great range of motion of the first metatarsophalangeal joint though there is some mild hesitation and tenderness on range of motion.  She has absolutely no pain on palpation of the heel itself.  Radiographs taken today demonstrate a well-healing osteotomy with internal fixation in good position.  Assessment: Well-healing surgical foot.  Plan: We will follow-up with her in 1 month.  Encouraged range of motion exercises.

## 2018-12-24 ENCOUNTER — Other Ambulatory Visit: Payer: Self-pay | Admitting: Family Medicine

## 2019-01-11 ENCOUNTER — Ambulatory Visit (INDEPENDENT_AMBULATORY_CARE_PROVIDER_SITE_OTHER): Payer: 59

## 2019-01-11 ENCOUNTER — Ambulatory Visit (INDEPENDENT_AMBULATORY_CARE_PROVIDER_SITE_OTHER): Payer: 59 | Admitting: Podiatry

## 2019-01-11 ENCOUNTER — Encounter: Payer: Self-pay | Admitting: Podiatry

## 2019-01-11 ENCOUNTER — Encounter

## 2019-01-11 DIAGNOSIS — Z9889 Other specified postprocedural states: Secondary | ICD-10-CM

## 2019-01-11 DIAGNOSIS — M2011 Hallux valgus (acquired), right foot: Secondary | ICD-10-CM

## 2019-01-11 DIAGNOSIS — M722 Plantar fascial fibromatosis: Secondary | ICD-10-CM

## 2019-01-11 NOTE — Progress Notes (Signed)
She presents today date of surgery 11/04/2018 status post [name] with osteotomy with screw fixation and and EPF of the right foot.  States that it is doing better but is still sore to feel fat underneath the toes and is still hurts to walk sometimes.  Objective: Vital signs are stable she is alert and oriented x3.  Pulses are palpable.  Minimal edema no erythema cellulitis drainage odor she is got great range of motion of the first metatarsophalangeal joint.  Radiographs taken today demonstrate a well-healing osteotomy with internal fixation.  Assessment: Well-healing surgical foot.  Plan: We will allow her to get back to her regular routine and follow-up with [place] on an as-needed basis.

## 2019-02-01 ENCOUNTER — Other Ambulatory Visit: Payer: Self-pay | Admitting: Obstetrics and Gynecology

## 2019-02-01 DIAGNOSIS — F3281 Premenstrual dysphoric disorder: Secondary | ICD-10-CM

## 2019-02-01 NOTE — Telephone Encounter (Signed)
Please advise 

## 2019-02-02 ENCOUNTER — Encounter: Payer: 59 | Admitting: Podiatry

## 2019-02-06 ENCOUNTER — Encounter: Payer: 59 | Admitting: Podiatry

## 2019-03-15 ENCOUNTER — Encounter: Payer: Self-pay | Admitting: *Deleted

## 2019-04-28 ENCOUNTER — Telehealth: Payer: Self-pay | Admitting: Family Medicine

## 2019-04-28 ENCOUNTER — Other Ambulatory Visit: Payer: Self-pay | Admitting: Family Medicine

## 2019-04-28 DIAGNOSIS — Z1231 Encounter for screening mammogram for malignant neoplasm of breast: Secondary | ICD-10-CM

## 2019-04-28 MED ORDER — PANTOPRAZOLE SODIUM 40 MG PO TBEC: 40.0000 mg | DELAYED_RELEASE_TABLET | Freq: Every day | ORAL | 0 refills | Status: DC

## 2019-04-28 NOTE — Telephone Encounter (Signed)
Best number [phone] Pt called checking on her rx  protonix She stated [facility] has been trying to get this refill Pt is out of her meds

## 2019-04-28 NOTE — Telephone Encounter (Signed)
We have not received any refill request from [facility] for her Protonix.  I did send in a 90 day supply.  She is past due for her CPE.  Will have Robin call and schedule her physical with fasting labs with Dr. Diona Browner sometime in the next 3 months.

## 2019-05-02 ENCOUNTER — Telehealth: Payer: Self-pay

## 2019-05-02 NOTE — Telephone Encounter (Signed)
Patient is schedule 05/03/19 with [facility]

## 2019-05-02 NOTE — Progress Notes (Deleted)
[name], MD   No chief complaint on file.   HPI:      [name] is a 48 y.o. [id number] who LMP was No LMP recorded. Patient has had a hysterectomy., presents today for ***  Hx of BV in past  Past Medical History:  Diagnosis Date  . Adenomyosis   . Alcohol abuse    last drink one year ago  . Anxiety   . Bipolar 1 disorder ([place])   . Chlamydia    as a teen  . Endometriosis   . MRSA (methicillin resistant staph aureus) culture positive   . PMDD (premenstrual dysphoric disorder)   . SUI (stress urinary incontinence, female)     Past Surgical History:  Procedure Laterality Date  . ABDOMINAL HYSTERECTOMY  2009   total lap hyst, LSO due to adenomyosis/endometriosis  . btl  11/09/1998  . DILATION AND CURETTAGE OF UTERUS  1997   after miscarriage  . LEEP  2000  . misscarriage     x5  . NEUROMA SURGERY     lipoma removal Right labia  . nsvd     3    Family History  Problem Relation Age of Onset  . Hypertension Mother   . Hyperlipidemia Mother   . Depression Mother   . Bipolar disorder Sister   . Depression Maternal Grandmother   . Breast cancer Neg Hx     Social History   Socioeconomic History  . Marital status: Single    Spouse name: Not on file  . Number of children: 3  . Years of education: Not on file  . Highest education level: Not on file  Occupational History    Employer: [employer]  . Financial resource strain: Not on file  . Food insecurity    Worry: Not on file    Inability: Not on file  . Transportation needs    Medical: Not on file    Non-medical: Not on file  Tobacco Use  . Smoking status: Never Smoker  . Smokeless tobacco: Never Used  Substance and Sexual Activity  . Alcohol use: No    Alcohol/week: 0.0 standard drinks  . Drug use: No  . Sexual activity: Yes  Lifestyle  . Physical activity    Days per week: Not on file    Minutes per session: Not on file  . Stress: Not on file  Relationships  .  Social Herbalist on phone: Not on file    Gets together: Not on file    Attends religious service: Not on file    Active member of club or organization: Not on file    Attends meetings of clubs or organizations: Not on file    Relationship status: Not on file  . Intimate partner violence    Fear of current or ex partner: Not on file    Emotionally abused: Not on file    Physically abused: Not on file    Forced sexual activity: Not on file  Other Topics Concern  . Not on file  Social History Narrative  . Not on file    Outpatient Medications Prior to Visit  Medication Sig Dispense Refill  . cetirizine (ZYRTEC) 10 MG tablet Take 10 mg by mouth daily.    . cyclobenzaprine (FLEXERIL) 10 MG tablet Take 1 tablet (10 mg total) by mouth at bedtime as needed for muscle spasms. 30 tablet 0  . diazepam (VALIUM) 5 MG  tablet Take 1 tablet (5 mg total) by mouth every 8 (eight) hours as needed for anxiety. 10 tablet 0  . diclofenac sodium (VOLTAREN) 1 % GEL Apply 2 g topically 4 (four) times daily. 100 g 3  . fluticasone (FLONASE) 50 MCG/ACT nasal spray Place 2 sprays into both nostrils daily. 48 g 3  . LORazepam (ATIVAN) 0.5 MG tablet TAKE 1 TABLET BY MOUTH EVERY 8 HOURS 30 tablet 0  . meclizine (ANTIVERT) 25 MG tablet Take 1 tablet (25 mg total) by mouth 3 (three) times daily as needed for dizziness. 30 tablet 0  . ondansetron (ZOFRAN) 4 MG tablet Take 1 tablet (4 mg total) by mouth every 8 (eight) hours as needed for nausea or vomiting. 20 tablet 0  . pantoprazole (PROTONIX) 40 MG tablet Take 1 tablet (40 mg total) by mouth daily. 90 tablet 0  . polyethylene glycol powder (GLYCOLAX/MIRALAX) powder Take 17 g by mouth 2 (two) times daily as needed. 250 g 1  . ranitidine (ZANTAC) 150 MG tablet Take 150 mg by mouth 2 (two) times daily.    Marland Kitchen triamcinolone cream (KENALOG) 0.1 % Apply 1 application topically 2 (two) times daily. 15 g 1   No facility-administered medications prior to visit.        ROS:  Review of Systems BREAST: No symptoms   OBJECTIVE:   Vitals:  There were no vitals taken for this visit.  Physical Exam  Results: No results found for this or any previous visit (from the past 24 hour(s)).   Assessment/Plan: No diagnosis found.    No orders of the defined types were placed in this encounter.     No follow-ups on file.  Dyasia Firestine B. Alazar Cherian, PA-C 05/02/2019 4:59 PM

## 2019-05-02 NOTE — Telephone Encounter (Signed)
Pt called after hour nurse last night c/o wants to make appt; has been dealing c bacterial vaginosis since the beginning of March. Is requesting.metronidazole/ [phone]  AMS adv to request medication via MyChart and f/u today.

## 2019-05-03 ENCOUNTER — Ambulatory Visit: Payer: 59 | Admitting: Obstetrics and Gynecology

## 2019-05-03 NOTE — Progress Notes (Signed)
Lacey Sanders, MD   Chief Complaint  Patient presents with  . Gynecologic Exam    BV S&S x 3 months    HPI:      Lacey Decker is a 48 y.o. [id number] who LMP was No LMP recorded. Patient has had a hysterectomy., presents today for BV sx of increased d/c and odor, no irritation. Sx intermittent for the past 3 months. Pt is not sex active, no prior abx use. No meds to treat. Hx of BV in past. No urin sx. Not taking probiotics, uses dryer sheets, dove soap.  Annual/pap due.  Past Medical History:  Diagnosis Date  . Adenomyosis   . Alcohol abuse    last drink one year ago  . Anxiety   . Bipolar 1 disorder ([place])   . Chlamydia    as a teen  . Endometriosis   . MRSA (methicillin resistant staph aureus) culture positive   . PMDD (premenstrual dysphoric disorder)   . SUI (stress urinary incontinence, female)     Past Surgical History:  Procedure Laterality Date  . ABDOMINAL HYSTERECTOMY  2009   total lap hyst, LSO due to adenomyosis/endometriosis  . btl  11/09/1998  . DILATION AND CURETTAGE OF UTERUS  1997   after miscarriage  . LEEP  2000  . misscarriage     x5  . NEUROMA SURGERY     lipoma removal Right labia  . nsvd     3    Family History  Problem Relation Age of Onset  . Hypertension Mother   . Hyperlipidemia Mother   . Depression Mother   . Bipolar disorder Sister   . Depression Maternal Grandmother   . Breast cancer Neg Hx     Social History   Socioeconomic History  . Marital status: Single    Spouse name: Not on file  . Number of children: 3  . Years of education: Not on file  . Highest education level: Not on file  Occupational History    Employer: [employer]  . Financial resource strain: Not on file  . Food insecurity    Worry: Not on file    Inability: Not on file  . Transportation needs    Medical: Not on file    Non-medical: Not on file  Tobacco Use  . Smoking status: Never Smoker  . Smokeless tobacco: Never Used   Substance and Sexual Activity  . Alcohol use: No    Alcohol/week: 0.0 standard drinks  . Drug use: No  . Sexual activity: Yes  Lifestyle  . Physical activity    Days per week: Not on file    Minutes per session: Not on file  . Stress: Not on file  Relationships  . Social Herbalist on phone: Not on file    Gets together: Not on file    Attends religious service: Not on file    Active member of club or organization: Not on file    Attends meetings of clubs or organizations: Not on file    Relationship status: Not on file  . Intimate partner violence    Fear of current or ex partner: Not on file    Emotionally abused: Not on file    Physically abused: Not on file    Forced sexual activity: Not on file  Other Topics Concern  . Not on file  Social History Narrative  . Not on file    Outpatient  Medications Prior to Visit  Medication Sig Dispense Refill  . cetirizine (ZYRTEC) 10 MG tablet Take 10 mg by mouth daily.    . fluticasone (FLONASE) 50 MCG/ACT nasal spray Place 2 sprays into both nostrils daily. 48 g 3  . LORazepam (ATIVAN) 0.5 MG tablet TAKE 1 TABLET BY MOUTH EVERY 8 HOURS 30 tablet 0  . pantoprazole (PROTONIX) 40 MG tablet Take 1 tablet (40 mg total) by mouth daily. 90 tablet 0  . cyclobenzaprine (FLEXERIL) 10 MG tablet Take 1 tablet (10 mg total) by mouth at bedtime as needed for muscle spasms. (Patient not taking: Reported on 05/04/2019) 30 tablet 0  . diazepam (VALIUM) 5 MG tablet Take 1 tablet (5 mg total) by mouth every 8 (eight) hours as needed for anxiety. (Patient not taking: Reported on 05/04/2019) 10 tablet 0  . diclofenac sodium (VOLTAREN) 1 % GEL Apply 2 g topically 4 (four) times daily. (Patient not taking: Reported on 05/04/2019) 100 g 3  . meclizine (ANTIVERT) 25 MG tablet Take 1 tablet (25 mg total) by mouth 3 (three) times daily as needed for dizziness. (Patient not taking: Reported on 05/04/2019) 30 tablet 0  . ondansetron (ZOFRAN) 4 MG tablet Take  1 tablet (4 mg total) by mouth every 8 (eight) hours as needed for nausea or vomiting. (Patient not taking: Reported on 05/04/2019) 20 tablet 0  . polyethylene glycol powder (GLYCOLAX/MIRALAX) powder Take 17 g by mouth 2 (two) times daily as needed. (Patient not taking: Reported on 05/04/2019) 250 g 1  . ranitidine (ZANTAC) 150 MG tablet Take 150 mg by mouth 2 (two) times daily.    Marland Kitchen triamcinolone cream (KENALOG) 0.1 % Apply 1 application topically 2 (two) times daily. (Patient not taking: Reported on 05/04/2019) 15 g 1   No facility-administered medications prior to visit.       ROS:  Review of Systems  Constitutional: Negative for fever.  Gastrointestinal: Negative for blood in stool, constipation, diarrhea, nausea and vomiting.  Genitourinary: Positive for vaginal discharge. Negative for dyspareunia, dysuria, flank pain, frequency, hematuria, urgency, vaginal bleeding and vaginal pain.  Musculoskeletal: Negative for back pain.  Skin: Negative for rash.  Psychiatric/Behavioral: Positive for agitation and dysphoric mood.   BREAST: No symptoms   OBJECTIVE:   Vitals:  BP 116/78 (BP Location: Left Arm, Patient Position: Sitting, Cuff Size: Normal)   Pulse 72   Ht 5\' 5"  (1.651 m)   Wt 194 lb (88 kg)   BMI 32.28 kg/m   Physical Exam Vitals signs reviewed.  Constitutional:      Appearance: She is well-developed.  Neck:     Musculoskeletal: Normal range of motion.  Pulmonary:     Effort: Pulmonary effort is normal.  Genitourinary:    General: Normal vulva.     Pubic Area: No rash.      Labia:        Right: No rash, tenderness or lesion.        Left: No rash, tenderness or lesion.      Vagina: Normal. No vaginal discharge, erythema or tenderness.     Cervix: Normal.     Uterus: Normal. Not enlarged and not tender.      Adnexa: Right adnexa normal and left adnexa normal.       Right: No mass or tenderness.         Left: No mass or tenderness.    Musculoskeletal: Normal range  of motion.  Skin:    General: Skin is warm and dry.  Neurological:     General: No focal deficit present.     Mental Status: She is alert and oriented to person, place, and time.  Psychiatric:        Mood and Affect: Mood normal.        Behavior: Behavior normal.        Thought Content: Thought content normal.        Judgment: Judgment normal.     Results: Results for orders placed or performed in visit on 05/04/19 (from the past 24 hour(s))  POCT Wet Prep with KOH     Status: Abnormal   Collection Time: 05/04/19  9:18 AM  Result Value Ref Range   Trichomonas, UA Negative    Clue Cells Wet Prep HPF POC few    Epithelial Wet Prep HPF POC     Yeast Wet Prep HPF POC neg    Bacteria Wet Prep HPF POC     RBC Wet Prep HPF POC     WBC Wet Prep HPF POC     KOH Prep POC Negative Negative     Assessment/Plan: Bacterial vaginosis - Plan: POCT Wet Prep with KOH, metroNIDAZOLE (FLAGYL) 500 MG tablet, Pos sx/wet prep. Rx flagyl. Will RF if sx recur. Line dry underwear/add probiotics. F/u prn.    Meds ordered this encounter  Medications  . metroNIDAZOLE (FLAGYL) 500 MG tablet    Sig: Take 1 tablet (500 mg total) by mouth 2 (two) times daily for 7 days.    Dispense:  14 tablet    Refill:  0    Order Specific Question:   Supervising Provider    Answer:   Gae Dry [[id number]]      Return in about 1 month (around 7/57/9728) for annual.  Lahela Woodin B. Amena Dockham, PA-C 05/04/2019 9:19 AM

## 2019-05-04 ENCOUNTER — Ambulatory Visit (INDEPENDENT_AMBULATORY_CARE_PROVIDER_SITE_OTHER): Payer: 59 | Admitting: Obstetrics and Gynecology

## 2019-05-04 ENCOUNTER — Other Ambulatory Visit: Payer: Self-pay

## 2019-05-04 ENCOUNTER — Encounter: Payer: Self-pay | Admitting: Obstetrics and Gynecology

## 2019-05-04 VITALS — BP 116/78 | HR 72 | Ht 65.0 in | Wt 194.0 lb

## 2019-05-04 DIAGNOSIS — B9689 Other specified bacterial agents as the cause of diseases classified elsewhere: Secondary | ICD-10-CM | POA: Diagnosis not present

## 2019-05-04 DIAGNOSIS — N760 Acute vaginitis: Secondary | ICD-10-CM

## 2019-05-04 LAB — POCT WET PREP WITH KOH
KOH Prep POC: NEGATIVE
Trichomonas, UA: NEGATIVE
Yeast Wet Prep HPF POC: NEGATIVE

## 2019-05-04 MED ORDER — METRONIDAZOLE 500 MG PO TABS: 500.0000 mg | ORAL_TABLET | Freq: Two times a day (BID) | ORAL | 0 refills | Status: AC

## 2019-05-04 NOTE — Telephone Encounter (Signed)
appintment 9/17

## 2019-05-04 NOTE — Patient Instructions (Addendum)
I value your feedback and entrusting [place] with your care. If you get a [facility] patient survey, I would appreciate you taking the time to let us know about your experience today. Thank you!  HEALTHY VAGINAL HYGIENE  AVOID   Panytyhose Synthetic underwear (wear COTTON underwear)  Tight pants/jeans Thongs Pantyliners Scented soaps/shower gels (use Dove Sensitive Skin soap or water to clean) Bubble bath/bath bombs Scented detergents  ALL dryer sheets (line dry underwear if using them on your other clothing) Feminine sprays/douches   FOR RECURRENT BACTERIAL VAGINOSIS (BV) Above recommendations and ADD probiotics daily, USE CONDOMS  Visit [email or web address]

## 2019-05-18 ENCOUNTER — Encounter: Payer: Self-pay | Admitting: Family Medicine

## 2019-05-18 ENCOUNTER — Other Ambulatory Visit: Payer: Self-pay

## 2019-05-18 ENCOUNTER — Ambulatory Visit (INDEPENDENT_AMBULATORY_CARE_PROVIDER_SITE_OTHER): Payer: 59 | Admitting: Family Medicine

## 2019-05-18 VITALS — Ht 65.0 in

## 2019-05-18 DIAGNOSIS — F410 Panic disorder [episodic paroxysmal anxiety] without agoraphobia: Secondary | ICD-10-CM | POA: Diagnosis not present

## 2019-05-18 DIAGNOSIS — F310 Bipolar disorder, current episode hypomanic: Secondary | ICD-10-CM

## 2019-05-18 DIAGNOSIS — Z7189 Other specified counseling: Secondary | ICD-10-CM

## 2019-05-18 DIAGNOSIS — F411 Generalized anxiety disorder: Secondary | ICD-10-CM

## 2019-05-18 MED ORDER — TRAZODONE HCL 50 MG PO TABS: 25.0000 mg | ORAL_TABLET | Freq: Every evening | ORAL | 3 refills | Status: DC | PRN

## 2019-05-18 MED ORDER — ONDANSETRON 4 MG PO TBDP: 4.0000 mg | ORAL_TABLET | Freq: Three times a day (TID) | ORAL | 0 refills | Status: DC | PRN

## 2019-05-18 MED ORDER — LORAZEPAM 0.5 MG PO TABS: 0.5000 mg | ORAL_TABLET | Freq: Two times a day (BID) | ORAL | 0 refills | Status: DC

## 2019-05-18 NOTE — Progress Notes (Signed)
VIRTUAL VISIT Due to national recommendations of social distancing due to Andrew 19, a virtual visit is felt to be most appropriate for this patient at this time.   I connected with the patient on 05/18/19 at  8:40 AM EDT by virtual telehealth platform and verified that I am speaking with the correct person using two identifiers.   I discussed the limitations, risks, security and privacy concerns of performing an evaluation and management service by  virtual telehealth platform and the availability of in person appointments. I also discussed with the patient that there may be a patient responsible charge related to this service. The patient expressed understanding and agreed to proceed.  Patient location: Home Provider Location:  [facility] Participants: Lacey Decker and [name]   Chief Complaint  Patient presents with  . Discuss Covid Concerns with Traveling    History of Present Illness: 48 year old female with bipolar disorder, generalized anxiety and panic attack presents to discuss concerns about travelling during the pandemic. She reports she has been extremely anxious thinking about flying on a plane as planned on 06/12/2019 to [place] with her Mom. Now that she will have to wear a mask on the plane she is even more anxious " I am worried that I will put on the mask , fall asleep and not wake up"   She has trouble sleeping at night and cannot stay asleep due to excessive worry.  She denies any mania symptoms, she denies depression.  She is not on bipolar med as she could not tolerate any of them.  She had been managing her anxiety with lifestyle and relaxation techniques well in last several years.   She is OFBPZ02 risk of complication level 1  GAD 7 : Generalized Anxiety Score 05/18/2019  Nervous, Anxious, on Edge 3  Control/stop worrying 3  Worry too much - different things 3  Trouble relaxing 3  Restless 2  Easily annoyed or irritable 0  Afraid - awful might  happen 3  Total GAD 7 Score 17  Anxiety Difficulty Very difficult    PHQ0    COVID 19 screen No recent travel or known exposure to COVID19 The patient denies respiratory symptoms of COVID 19 at this time.  The importance of social distancing was discussed today.   Review of Systems  Constitutional: Negative for chills and fever.  HENT: Negative for congestion and ear pain.   Eyes: Negative for pain and redness.  Respiratory: Negative for cough and shortness of breath.   Cardiovascular: Negative for chest pain, palpitations and leg swelling.  Gastrointestinal: Negative for abdominal pain, blood in stool, constipation, diarrhea, nausea and vomiting.  Genitourinary: Negative for dysuria.  Musculoskeletal: Negative for falls and myalgias.  Skin: Negative for rash.  Neurological: Negative for dizziness.  Psychiatric/Behavioral: Negative for depression. The patient is not nervous/anxious.       Past Medical History:  Diagnosis Date  . Adenomyosis   . Alcohol abuse    last drink one year ago  . Anxiety   . Bipolar 1 disorder ([place])   . Chlamydia    as a teen  . Endometriosis   . MRSA (methicillin resistant staph aureus) culture positive   . PMDD (premenstrual dysphoric disorder)   . SUI (stress urinary incontinence, female)     reports that she has never smoked. She has never used smokeless tobacco. She reports that she does not drink alcohol or use drugs.   Current Outpatient Medications:  .  cetirizine (  ZYRTEC) 10 MG tablet, Take 10 mg by mouth daily., Disp: , Rfl:  .  diclofenac sodium (VOLTAREN) 1 % GEL, Apply 2 g topically 4 (four) times daily., Disp: 100 g, Rfl: 3 .  fluticasone (FLONASE) 50 MCG/ACT nasal spray, Place 2 sprays into both nostrils daily., Disp: 48 g, Rfl: 3 .  pantoprazole (PROTONIX) 40 MG tablet, Take 1 tablet (40 mg total) by mouth daily., Disp: 90 tablet, Rfl: 0   Observations/Objective: Height 5\' 5"  (1.651 m).  Physical Exam  Physical  Exam Constitutional:      General: The patient is not in acute distress. Pulmonary:     Effort: Pulmonary effort is normal. No respiratory distress.  Neurological:     Mental Status: The patient is alert and oriented to person, place, and time.  Psychiatric:        Mood and Affect: Mood normal.        Behavior: Behavior normal.   ,Assessment and Plan  Total visit time 25 minutes, > 50% spent counseling and cordinating patients care.  Discussed pt concerns about planes, covid 10 risjk and mask wearing.   Cannot use SSRI alone for GAD given bipolar history.  Pt refuses any mood stabilizer. Will start trazodone for sleep.  Pt has called her counselor to set up counseling reguarding her fears.  We will use ativan BID prn panic attacks.   She can use zofran sublingual if motion sickness as this causes less sedation than other meds.    I discussed the assessment and treatment plan with the patient. The patient was provided an opportunity to ask questions and all were answered. The patient agreed with the plan and demonstrated an understanding of the instructions.   The patient was advised to call back or seek an in-person evaluation if the symptoms worsen or if the condition fails to improve as anticipated.     Lacey Lofts, MD

## 2019-06-05 ENCOUNTER — Other Ambulatory Visit: Payer: Self-pay | Admitting: Family Medicine

## 2019-06-21 ENCOUNTER — Other Ambulatory Visit: Payer: Self-pay | Admitting: Family Medicine

## 2019-06-21 ENCOUNTER — Ambulatory Visit (INDEPENDENT_AMBULATORY_CARE_PROVIDER_SITE_OTHER): Payer: 59 | Admitting: Psychology

## 2019-06-21 DIAGNOSIS — F3160 Bipolar disorder, current episode mixed, unspecified: Secondary | ICD-10-CM | POA: Diagnosis not present

## 2019-06-27 ENCOUNTER — Ambulatory Visit
Admission: RE | Admit: 2019-06-27 | Discharge: 2019-06-27 | Disposition: A | Discharge status: Home / Self Care | Payer: 59 | Source: Ambulatory Visit | Attending: Family Medicine | Admitting: Family Medicine

## 2019-06-27 ENCOUNTER — Other Ambulatory Visit: Payer: Self-pay | Admitting: Family Medicine

## 2019-06-27 ENCOUNTER — Other Ambulatory Visit: Payer: Self-pay

## 2019-06-27 DIAGNOSIS — Z1231 Encounter for screening mammogram for malignant neoplasm of breast: Secondary | ICD-10-CM | POA: Insufficient documentation

## 2019-06-27 DIAGNOSIS — R928 Other abnormal and inconclusive findings on diagnostic imaging of breast: Secondary | ICD-10-CM

## 2019-06-27 DIAGNOSIS — N6489 Other specified disorders of breast: Secondary | ICD-10-CM

## 2019-06-27 NOTE — Progress Notes (Signed)
Abnormal Mammogram.  Needs further imaging.  Will be contacted by  imaging office to schedule further imaging per result note.

## 2019-06-29 ENCOUNTER — Ambulatory Visit (INDEPENDENT_AMBULATORY_CARE_PROVIDER_SITE_OTHER): Payer: 59 | Admitting: Psychology

## 2019-06-29 DIAGNOSIS — F3110 Bipolar disorder, current episode manic without psychotic features, unspecified: Secondary | ICD-10-CM

## 2019-07-05 ENCOUNTER — Ambulatory Visit
Admission: RE | Admit: 2019-07-05 | Discharge: 2019-07-05 | Disposition: A | Discharge status: Home / Self Care | Payer: 59 | Source: Ambulatory Visit | Attending: Family Medicine | Admitting: Family Medicine

## 2019-07-05 DIAGNOSIS — R928 Other abnormal and inconclusive findings on diagnostic imaging of breast: Secondary | ICD-10-CM | POA: Insufficient documentation

## 2019-07-05 DIAGNOSIS — N6489 Other specified disorders of breast: Secondary | ICD-10-CM | POA: Insufficient documentation

## 2019-07-12 ENCOUNTER — Ambulatory Visit: Payer: 59 | Admitting: Psychology

## 2019-07-13 ENCOUNTER — Ambulatory Visit (INDEPENDENT_AMBULATORY_CARE_PROVIDER_SITE_OTHER): Payer: 59 | Admitting: Psychology

## 2019-07-13 DIAGNOSIS — F3110 Bipolar disorder, current episode manic without psychotic features, unspecified: Secondary | ICD-10-CM

## 2019-07-18 ENCOUNTER — Telehealth: Payer: Self-pay | Admitting: Family Medicine

## 2019-07-18 ENCOUNTER — Other Ambulatory Visit: Payer: Self-pay | Admitting: Family Medicine

## 2019-07-18 MED ORDER — ESOMEPRAZOLE MAGNESIUM 40 MG PO CPDR: 40.0000 mg | DELAYED_RELEASE_CAPSULE | Freq: Every day | ORAL | 1 refills | Status: DC

## 2019-07-18 MED ORDER — OMEPRAZOLE 40 MG PO CPDR: 40.0000 mg | DELAYED_RELEASE_CAPSULE | Freq: Every day | ORAL | 1 refills | Status: DC

## 2019-07-18 NOTE — Telephone Encounter (Signed)
Can try trial of nexium.Marland Kitchen if not improving should be referred to GI.

## 2019-07-18 NOTE — Addendum Note (Signed)
Addended by: Carter Kitten on: 07/18/2019 05:15 PM   Modules accepted: Orders

## 2019-07-18 NOTE — Telephone Encounter (Signed)
Nexium not covered.  Rx for Prilosec 40 mg sent to [facility] as instructed by Dr. Diona Browner.  Left message for Lacey Decker that we have sent in a prescription for Prilosec for her.  If not improving on the new medication, then Dr. Diona Browner wants to refer her to GI.

## 2019-07-18 NOTE — Telephone Encounter (Signed)
Patient called and stated that [name] does not seem to be helping She stated that her Acid reflux is still pretty bad and the medication doesn't seem to give her any relief.   She would like to know if she could be prescribed prilosec or nexium   [facility] [place]

## 2019-07-27 ENCOUNTER — Encounter: Payer: 59 | Admitting: Family Medicine

## 2019-07-27 ENCOUNTER — Ambulatory Visit (INDEPENDENT_AMBULATORY_CARE_PROVIDER_SITE_OTHER): Payer: 59 | Admitting: Family Medicine

## 2019-07-27 DIAGNOSIS — Z13220 Encounter for screening for lipoid disorders: Secondary | ICD-10-CM

## 2019-07-27 DIAGNOSIS — K2210 Ulcer of esophagus without bleeding: Secondary | ICD-10-CM

## 2019-07-27 DIAGNOSIS — Z1211 Encounter for screening for malignant neoplasm of colon: Secondary | ICD-10-CM

## 2019-07-27 DIAGNOSIS — F411 Generalized anxiety disorder: Secondary | ICD-10-CM

## 2019-07-27 DIAGNOSIS — Z0000 Encounter for general adult medical examination without abnormal findings: Secondary | ICD-10-CM

## 2019-07-27 DIAGNOSIS — R5383 Other fatigue: Secondary | ICD-10-CM | POA: Diagnosis not present

## 2019-07-27 DIAGNOSIS — F310 Bipolar disorder, current episode hypomanic: Secondary | ICD-10-CM

## 2019-07-27 DIAGNOSIS — R0789 Other chest pain: Secondary | ICD-10-CM

## 2019-07-27 MED ORDER — LORAZEPAM 0.5 MG PO TABS: 0.5000 mg | ORAL_TABLET | Freq: Two times a day (BID) | ORAL | 0 refills | Status: DC

## 2019-07-27 MED ORDER — OMEPRAZOLE 40 MG PO CPDR: 40.0000 mg | DELAYED_RELEASE_CAPSULE | Freq: Every day | ORAL | 1 refills | Status: DC

## 2019-07-27 NOTE — Patient Instructions (Addendum)
Call to set up  fasting labs and  set up appt next week for re-evaluation in person for neck pain and shoulder pain

## 2019-07-27 NOTE — Assessment & Plan Note (Signed)
Moderate control.. using ativan BID when worse. Never tried trazodone for sleep.

## 2019-07-27 NOTE — Progress Notes (Signed)
VIRTUAL VISIT Due to national recommendations of social distancing due to [place] 19, a virtual visit is felt to be most appropriate for this patient at this time.   I connected with the patient on 07/27/19 at  8:40 AM EDT by virtual telehealth platform and verified that I am speaking with the correct person using two identifiers.   I discussed the limitations, risks, security and privacy concerns of performing an evaluation and management service by  virtual telehealth platform and the availability of in person appointments. I also discussed with the patient that there may be a patient responsible charge related to this service. The patient expressed understanding and agreed to proceed.  Patient location: Home Provider Location: [facility] Participants: Eliezer Lofts and [name]   Chief Complaint  Patient presents with  . Annual Exam    History of Present Illness: The patient is here for annual wellness exam and preventative care.    Bipolar disorder, GAD: Depression: At last appt on 05/18/2019... Started on trazodone for sleep... she never took any.  Restarted counseling with Dr. Rexene Edison.  Using ativan BID prn panic attacks. She is not doing well with mood overall.. she is now okay with seeing a psychiatrist.   GERD: Poor control  Daily heartburn, daily chest She was worried protonix causes chest pain.  Much better control on prilosec 40 mg BID in last week.  Minimal symptoms now. Hx of erosive esophagitis.   Continued left neck pain.Marland Kitchen tried PT.. cannot afford.  Using heat, stretching.  NSAIDs helped in past...but currently with GERD flaring. Due for re-eval with lipid panel COVID 19 scree No recent travel or known exposure to [place] The patient denies respiratory symptoms of COVID 19 at this time.  The importance of social distancing was discussed today.   Review of Systems  Constitutional: Negative for chills and fever.  HENT: Negative for congestion and ear  pain.   Eyes: Negative for pain and redness.  Respiratory: Negative for cough and shortness of breath.   Cardiovascular: Negative for chest pain, palpitations and leg swelling.  Gastrointestinal: Positive for heartburn and nausea. Negative for abdominal pain, blood in stool, constipation, diarrhea and vomiting.  Genitourinary: Negative for dysuria.  Musculoskeletal: Negative for falls and myalgias.  Skin: Negative for rash.  Neurological: Negative for dizziness.  Psychiatric/Behavioral: Negative for depression and suicidal ideas. The patient is nervous/anxious and has insomnia.       Past Medical History:  Diagnosis Date  . Adenomyosis   . Alcohol abuse    last drink one year ago  . Anxiety   . Bipolar 1 disorder ([place])   . Chlamydia    as a teen  . Endometriosis   . MRSA (methicillin resistant staph aureus) culture positive   . PMDD (premenstrual dysphoric disorder)   . SUI (stress urinary incontinence, female)     reports that she has never smoked. She has never used smokeless tobacco. She reports that she does not drink alcohol or use drugs.   Current Outpatient Medications:  .  cetirizine (ZYRTEC) 10 MG tablet, Take 10 mg by mouth daily., Disp: , Rfl:  .  diclofenac sodium (VOLTAREN) 1 % GEL, Apply 2 g topically 4 (four) times daily., Disp: 100 g, Rfl: 3 .  fluticasone (FLONASE) 50 MCG/ACT nasal spray, Place 2 sprays into both nostrils daily., Disp: 48 g, Rfl: 3 .  LORazepam (ATIVAN) 0.5 MG tablet, Take 1 tablet (0.5 mg total) by mouth every 12 (twelve) hours., Disp: 60  tablet, Rfl: 0 .  omeprazole (PRILOSEC) 40 MG capsule, Take 1 capsule (40 mg total) by mouth daily., Disp: 30 capsule, Rfl: 1 .  ondansetron (ZOFRAN ODT) 4 MG disintegrating tablet, Take 1 tablet (4 mg total) by mouth every 8 (eight) hours as needed for nausea or vomiting., Disp: 10 tablet, Rfl: 0 .  traZODone (DESYREL) 50 MG tablet, Take 0.5-1 tablets (25-50 mg total) by mouth at bedtime as needed for sleep.  (Patient not taking: Reported on 07/27/2019), Disp: 30 tablet, Rfl: 3   Observations/Objective: There were no vitals taken for this visit.  Physical Exam  Physical Exam Constitutional:      General: The patient is not in acute distress. Pulmonary:     Effort: Pulmonary effort is normal. No respiratory distress.  Neurological:     Mental Status: The patient is alert and oriented to person, place, and time.  Psychiatric:        Mood and Affect: Mood normal.        Behavior: Behavior normal.   Assessment and Plan The patient's preventative maintenance and recommended screening tests for an annual wellness exam were reviewed in full today. Brought up to date unless services declined.  Counselled on the importance of diet, exercise, and its role in overall health and mortality. The patient's FH and SH was reviewed, including their home life, tobacco status, and drug and alcohol status.   Vaccines: uptodate with Td, plans to get flu vaccine Pap/DVE:     Per GYN Mammo:   Per GYN, done this year. Bone Density:not indicated Colon: no early family history, due for eval... plans colonoscopy.  Smoking Status: none ETOH/ drug use: minimal HIV screen:  completed in past   I discussed the assessment and treatment plan with the patient. The patient was provided an opportunity to ask questions and all were answered. The patient agreed with the plan and demonstrated an understanding of the instructions.   The patient was advised to call back or seek an in-person evaluation if the symptoms worsen or if the condition fails to improve as anticipated.  Bipolar disorder (HCC)  Inadequate control.  Agree with need of pt to see psychiatry given not doing well.  She is a little more open to mood stabilizers at this time. Encouraged her to move forward with this.   Generalized anxiety disorder  Moderate control.. using ativan BID when worse. Never tried trazodone for sleep.  Anterior chest wall  pain Improved with improvement of GERD.  Erosive esophagitis GERD improved on  prilosec 40 mg BID. '    Eliezer Lofts, MD

## 2019-07-27 NOTE — Assessment & Plan Note (Addendum)
GERD improved on  prilosec 40 mg BID.

## 2019-07-27 NOTE — Assessment & Plan Note (Signed)
Inadequate control.  Agree with need of pt to see psychiatry given not doing well.  She is a little more open to mood stabilizers at this time. Encouraged her to move forward with this.

## 2019-07-27 NOTE — Assessment & Plan Note (Signed)
Improved with improvement of GERD.

## 2019-07-28 ENCOUNTER — Telehealth: Payer: Self-pay | Admitting: Family Medicine

## 2019-07-28 ENCOUNTER — Other Ambulatory Visit (INDEPENDENT_AMBULATORY_CARE_PROVIDER_SITE_OTHER): Payer: 59

## 2019-07-28 DIAGNOSIS — Z13220 Encounter for screening for lipoid disorders: Secondary | ICD-10-CM | POA: Diagnosis not present

## 2019-07-28 DIAGNOSIS — R5383 Other fatigue: Secondary | ICD-10-CM | POA: Diagnosis not present

## 2019-07-28 LAB — LIPID PANEL
Cholesterol: 154 mg/dL (ref 0–200)
HDL: 44.9 mg/dL (ref >39.00)
LDL Cholesterol: 95 mg/dL (ref 0–99)
NonHDL: 109.28
Total CHOL/HDL Ratio: 3
Triglycerides: 69 mg/dL (ref 0.0–149.0)
VLDL: 13.8 mg/dL (ref 0.0–40.0)

## 2019-07-28 LAB — COMPREHENSIVE METABOLIC PANEL
ALT: 11 U/L (ref 0–35)
AST: 13 U/L (ref 0–37)
Albumin: 4.2 g/dL (ref 3.5–5.2)
Alkaline Phosphatase: 53 U/L (ref 39–117)
BUN: 18 mg/dL (ref 6–23)
CO2: 27 mEq/L (ref 19–32)
Calcium: 9.3 mg/dL (ref 8.4–10.5)
Chloride: 106 mEq/L (ref 96–112)
Creatinine, Ser: 0.75 mg/dL (ref 0.40–1.20)
GFR: 81.59 mL/min (ref >60.00)
Glucose, Bld: 103 mg/dL — ABNORMAL HIGH (ref 70–99)
Potassium: 4.3 mEq/L (ref 3.5–5.1)
Sodium: 140 mEq/L (ref 135–145)
Total Bilirubin: 0.3 mg/dL (ref 0.2–1.2)
Total Protein: 6.3 g/dL (ref 6.0–8.3)

## 2019-07-28 LAB — T4, FREE: Free T4: 0.92 ng/dL (ref 0.60–1.60)

## 2019-07-28 LAB — TSH: TSH: 1.26 u[IU]/mL (ref 0.35–4.50)

## 2019-07-28 LAB — T3, FREE: T3, Free: 2.7 pg/mL (ref 2.3–4.2)

## 2019-07-28 MED ORDER — LIDOCAINE 5 % EX PTCH: 1.0000 | MEDICATED_PATCH | CUTANEOUS | 0 refills | Status: DC

## 2019-07-28 NOTE — Telephone Encounter (Signed)
I sent in lidocaine patches.. if she is not improving she needs to make an in office visit for further eval of neck and shoulder pain.

## 2019-07-28 NOTE — Telephone Encounter (Signed)
Lacey Decker notiifed by telephone that her insurance has denied the Lidoderm 5% patch.  I did advise she can buy OTC Lidocaine 4% patches at the pharmacy.  She states she will try that and if things don't improve she will call back for x-rays.

## 2019-07-28 NOTE — Telephone Encounter (Signed)
Pt came in for a lab visit this morning and she states that she had a Doxy with Dr. Diona Browner yesterday, she was supposed to get an X-Ray but she said she doesn't want to do X-Ray just yet. She wants to try the Lidocaine patches first. Will you please send in prescription for the patches?  [facility] [phone]

## 2019-08-01 NOTE — Progress Notes (Deleted)
PCP:  [name], MD   No chief complaint on file.    HPI:      Lacey Decker is a 48 y.o. [id number] who LMP was No LMP recorded. Patient has had a hysterectomy., presents today for her annual examination.  Her menses are absent due to [place] LSO due to endometriosis/adenomyosis in 2009. She has noticed some mild RLQ cramping cyclically over the yr. Sx relieved with NSAIDs. She does not have intermenstrual bleeding. She has anxiety/PMDD sx cyclically and takes ativan sparingly. She needs RF. She also is having increasing vasomotor sx.  Sex activity: single partner, contraception - hysterectomy. Last Pap: September 24, 2015  Results were: no abnormalities /neg HPV DNA  Hx of STDs: HPV on pap  Hx of BV in the past, last treated 6/20  Last mammogram: 07/05/19  Results were: normal--routine follow-up in 12 months There is no FH of breast cancer. There is no FH of ovarian cancer. The patient does not do self-breast exams.  Tobacco use: The patient denies current or previous tobacco use. Alcohol use: none No drug use.  Exercise: moderately active  She does not get adequate calcium and Vitamin D in her diet.  Labs with PCP.  She has had increased urinary incont the past few months. She used to have SUI but now sx are just a few drops of leakage randomly. No urge incont sx. She drinks a little caffeine. She sometimes holds her urine due to her job. She saw urogynecology about 10 yrs ago and was told her bladder had dropped.    Past Medical History:  Diagnosis Date  . Adenomyosis   . Alcohol abuse    last drink one year ago  . Anxiety   . Bipolar 1 disorder ([place])   . Chlamydia    as a teen  . Endometriosis   . MRSA (methicillin resistant staph aureus) culture positive   . PMDD (premenstrual dysphoric disorder)   . SUI (stress urinary incontinence, female)     Past Surgical History:  Procedure Laterality Date  . ABDOMINAL HYSTERECTOMY  2009   total lap hyst, LSO due to  adenomyosis/endometriosis  . btl  11/09/1998  . DILATION AND CURETTAGE OF UTERUS  1997   after miscarriage  . LEEP  2000  . misscarriage     x5  . NEUROMA SURGERY     lipoma removal Right labia  . nsvd     3    Family History  Problem Relation Age of Onset  . Hypertension Mother   . Hyperlipidemia Mother   . Depression Mother   . Basal cell carcinoma Mother   . Colon polyps Mother   . Bipolar disorder Sister   . Depression Maternal Grandmother   . Lung cancer Maternal Grandfather   . Breast cancer Neg Hx     Social History   Socioeconomic History  . Marital status: Single    Spouse name: Not on file  . Number of children: 3  . Years of education: Not on file  . Highest education level: Not on file  Occupational History    Employer: [employer]  . Financial resource strain: Not on file  . Food insecurity    Worry: Not on file    Inability: Not on file  . Transportation needs    Medical: Not on file    Non-medical: Not on file  Tobacco Use  . Smoking status: Never Smoker  . Smokeless tobacco:  Never Used  Substance and Sexual Activity  . Alcohol use: No    Alcohol/week: 0.0 standard drinks  . Drug use: No  . Sexual activity: Yes  Lifestyle  . Physical activity    Days per week: Not on file    Minutes per session: Not on file  . Stress: Not on file  Relationships  . Social Herbalist on phone: Not on file    Gets together: Not on file    Attends religious service: Not on file    Active member of club or organization: Not on file    Attends meetings of clubs or organizations: Not on file    Relationship status: Not on file  . Intimate partner violence    Fear of current or ex partner: Not on file    Emotionally abused: Not on file    Physically abused: Not on file    Forced sexual activity: Not on file  Other Topics Concern  . Not on file  Social History Narrative  . Not on file    Outpatient Medications Prior to Visit   Medication Sig Dispense Refill  . cetirizine (ZYRTEC) 10 MG tablet Take 10 mg by mouth daily.    . diclofenac sodium (VOLTAREN) 1 % GEL Apply 2 g topically 4 (four) times daily. 100 g 3  . fluticasone (FLONASE) 50 MCG/ACT nasal spray Place 2 sprays into both nostrils daily. 48 g 3  . lidocaine (LIDODERM) 5 % Place 1 patch onto the skin daily. Remove & Discard patch within 12 hours or as directed by MD 30 patch 0  . LORazepam (ATIVAN) 0.5 MG tablet Take 1 tablet (0.5 mg total) by mouth every 12 (twelve) hours. 60 tablet 0  . omeprazole (PRILOSEC) 40 MG capsule Take 1 capsule (40 mg total) by mouth daily. 90 capsule 1  . ondansetron (ZOFRAN ODT) 4 MG disintegrating tablet Take 1 tablet (4 mg total) by mouth every 8 (eight) hours as needed for nausea or vomiting. 10 tablet 0  . traZODone (DESYREL) 50 MG tablet Take 0.5-1 tablets (25-50 mg total) by mouth at bedtime as needed for sleep. (Patient not taking: Reported on 07/27/2019) 30 tablet 3   No facility-administered medications prior to visit.       ROS:  Review of Systems  Constitutional: Negative for fatigue, fever and unexpected weight change.  Respiratory: Negative for cough, shortness of breath and wheezing.   Cardiovascular: Negative for chest pain, palpitations and leg swelling.  Gastrointestinal: Positive for constipation. Negative for blood in stool, diarrhea, nausea and vomiting.  Endocrine: Negative for cold intolerance, heat intolerance and polyuria.  Genitourinary: Positive for vaginal discharge. Negative for dyspareunia, dysuria, flank pain, frequency, genital sores, hematuria, menstrual problem, pelvic pain, urgency, vaginal bleeding and vaginal pain.  Musculoskeletal: Negative for back pain, joint swelling and myalgias.  Skin: Negative for rash.  Neurological: Negative for dizziness, syncope, light-headedness, numbness and headaches.  Hematological: Negative for adenopathy.  Psychiatric/Behavioral: Negative for agitation,  confusion, sleep disturbance and suicidal ideas. The patient is not nervous/anxious.    BREAST: No symptoms   Objective: There were no vitals taken for this visit.   Physical Exam Constitutional:      Appearance: She is well-developed.  Genitourinary:     Vagina normal.     No vaginal discharge, erythema or tenderness.     No right or left adnexal mass present.     Right adnexa not tender.     Left adnexa not  tender.     Genitourinary Comments: UTERUS/CX SURG REM; GRADE 1 CYSTOCELE  Neck:     Musculoskeletal: Normal range of motion.     Thyroid: No thyromegaly.  Cardiovascular:     Rate and Rhythm: Normal rate and regular rhythm.     Heart sounds: Normal heart sounds. No murmur.  Pulmonary:     Effort: Pulmonary effort is normal.     Breath sounds: Normal breath sounds.  Chest:     Breasts:        Right: No mass, nipple discharge, skin change or tenderness.        Left: No mass, nipple discharge, skin change or tenderness.  Abdominal:     Palpations: Abdomen is soft.     Tenderness: There is no abdominal tenderness. There is no guarding.  Musculoskeletal: Normal range of motion.  Neurological:     Mental Status: She is alert and oriented to person, place, and time.     Cranial Nerves: No cranial nerve deficit.  Psychiatric:        Behavior: Behavior normal.  Vitals signs reviewed.     Results: No results found for this or any previous visit (from the past 24 hour(s)).  Assessment/Plan: No diagnosis found.  No orders of the defined types were placed in this encounter.            GYN counsel breast self exam, mammography screening, menopause, adequate intake of calcium and vitamin D, diet and exercise     F/U  No follow-ups on file.  Norvin Ohlin B. Heatherly Stenner, PA-C 08/01/2019 4:21 PM

## 2019-08-02 ENCOUNTER — Ambulatory Visit: Payer: 59 | Admitting: Obstetrics and Gynecology

## 2019-08-02 ENCOUNTER — Ambulatory Visit: Payer: 59 | Admitting: Psychology

## 2019-08-16 ENCOUNTER — Ambulatory Visit (INDEPENDENT_AMBULATORY_CARE_PROVIDER_SITE_OTHER): Payer: 59 | Admitting: Psychology

## 2019-08-16 DIAGNOSIS — F3111 Bipolar disorder, current episode manic without psychotic features, mild: Secondary | ICD-10-CM

## 2019-08-18 ENCOUNTER — Telehealth: Payer: Self-pay

## 2019-08-18 NOTE — Telephone Encounter (Signed)
No clear interactions with any med she is taking. Okay to try a trial of evening primrose oil.

## 2019-08-18 NOTE — Telephone Encounter (Signed)
Left message for Lacey Decker ,no clear interactions with any meds she is taking. Okay to try a trial of evening primrose oil per Dr. Diona Browner.

## 2019-08-18 NOTE — Telephone Encounter (Signed)
Pt has a friend that has taken OTC evening primrose oil 500 mg and it helped her anxiety a lot. Pt wants to know if Dr Diona Browner thinks it would be OK for pt to take the OTC evening primrose oil 500 mg (the bottle suggest taking 3 daily) for anxiety. Pt wants to make sure will not interact with her other meds and request cb.last annual exam 07/27/19.Please advise.

## 2019-08-21 NOTE — Progress Notes (Signed)
PCP:  [name], MD   Chief Complaint  Patient presents with  . Gynecologic Exam    flu shot today     HPI:      [name] is a 48 y.o. [id number] who LMP was No LMP recorded. Patient has had a hysterectomy., presents today for her annual examination.  Her menses are absent due to [place] LSO due to endometriosis/adenomyosis in 2009. No pelvic pain. She does not have intermenstrual bleeding. Having terrible night sweats and insomnia this yr. Has taken melatonin with a little improvement. She used to have anxiety/PMDD sx cyclically and took ativan sparingly, but has just felt anxious in general this yr due to covid and other stressors. Did prozac when younger with sx control of anxiety/depression sx, but had side effects when tried it again at a later date. Hx of bipolar. Sees therapist.  Sex activity: not sex active  Last Pap: September 24, 2015  Results were: no abnormalities /neg HPV DNA  Hx of STDs: HPV on pap/LEEP  Hx of BV in the past, last treated 6/20   Last mammogram: 07/05/19  Results were: normal--routine follow-up in 12 months There is no FH of breast cancer. There is no FH of ovarian cancer. The patient does not do self-breast exams.  Tobacco use: The patient denies current or previous tobacco use. Alcohol use: none No drug use.  Exercise: not active  Colonoscopy: scheduled for 1/21.  She does not get adequate calcium and Vitamin D in her diet.  Labs with PCP.   Past Medical History:  Diagnosis Date  . Adenomyosis   . Alcohol abuse    last drink one year ago  . Anxiety   . Bipolar 1 disorder ([place])   . Chlamydia    as a teen  . Endometriosis   . MRSA (methicillin resistant staph aureus) culture positive   . PMDD (premenstrual dysphoric disorder)   . SUI (stress urinary incontinence, female)     Past Surgical History:  Procedure Laterality Date  . ABDOMINAL HYSTERECTOMY  2009   total lap hyst, LSO due to adenomyosis/endometriosis  . btl   11/09/1998  . DILATION AND CURETTAGE OF UTERUS  1997   after miscarriage  . LEEP  2000  . misscarriage     x5  . NEUROMA SURGERY     lipoma removal Right labia  . nsvd     3    Family History  Problem Relation Age of Onset  . Hypertension Mother   . Hyperlipidemia Mother   . Depression Mother   . Basal cell carcinoma Mother   . Colon polyps Mother   . Bipolar disorder Sister   . Depression Maternal Grandmother   . Lung cancer Maternal Grandfather   . Breast cancer Neg Hx     Social History   Socioeconomic History  . Marital status: Single    Spouse name: Not on file  . Number of children: 3  . Years of education: Not on file  . Highest education level: Not on file  Occupational History    Employer: [employer]  . Financial resource strain: Not on file  . Food insecurity    Worry: Not on file    Inability: Not on file  . Transportation needs    Medical: Not on file    Non-medical: Not on file  Tobacco Use  . Smoking status: Never Smoker  . Smokeless tobacco: Never Used  Substance and Sexual  Activity  . Alcohol use: No    Alcohol/week: 0.0 standard drinks  . Drug use: No  . Sexual activity: Not Currently  Lifestyle  . Physical activity    Days per week: Not on file    Minutes per session: Not on file  . Stress: Not on file  Relationships  . Social Herbalist on phone: Not on file    Gets together: Not on file    Attends religious service: Not on file    Active member of club or organization: Not on file    Attends meetings of clubs or organizations: Not on file    Relationship status: Not on file  . Intimate partner violence    Fear of current or ex partner: Not on file    Emotionally abused: Not on file    Physically abused: Not on file    Forced sexual activity: Not on file  Other Topics Concern  . Not on file  Social History Narrative  . Not on file    Outpatient Medications Prior to Visit  Medication Sig Dispense  Refill  . cetirizine (ZYRTEC) 10 MG tablet Take 10 mg by mouth daily.    . diclofenac sodium (VOLTAREN) 1 % GEL Apply 2 g topically 4 (four) times daily. 100 g 3  . fluticasone (FLONASE) 50 MCG/ACT nasal spray Place 2 sprays into both nostrils daily. 48 g 3  . LORazepam (ATIVAN) 0.5 MG tablet Take 1 tablet (0.5 mg total) by mouth every 12 (twelve) hours. 60 tablet 0  . omeprazole (PRILOSEC) 40 MG capsule Take 1 capsule (40 mg total) by mouth daily. 90 capsule 1  . lidocaine (LIDODERM) 5 % Place 1 patch onto the skin daily. Remove & Discard patch within 12 hours or as directed by MD 30 patch 0  . ondansetron (ZOFRAN ODT) 4 MG disintegrating tablet Take 1 tablet (4 mg total) by mouth every 8 (eight) hours as needed for nausea or vomiting. 10 tablet 0  . traZODone (DESYREL) 50 MG tablet Take 0.5-1 tablets (25-50 mg total) by mouth at bedtime as needed for sleep. (Patient not taking: Reported on 07/27/2019) 30 tablet 3   No facility-administered medications prior to visit.       ROS:  Review of Systems  Constitutional: Negative for fatigue, fever and unexpected weight change.  Respiratory: Negative for cough, shortness of breath and wheezing.   Cardiovascular: Negative for chest pain, palpitations and leg swelling.  Gastrointestinal: Negative for blood in stool, constipation, diarrhea, nausea and vomiting.  Endocrine: Negative for cold intolerance, heat intolerance and polyuria.  Genitourinary: Positive for frequency. Negative for dyspareunia, dysuria, flank pain, genital sores, hematuria, menstrual problem, pelvic pain, urgency, vaginal bleeding, vaginal discharge and vaginal pain.  Musculoskeletal: Negative for back pain, joint swelling and myalgias.  Skin: Negative for rash.  Neurological: Negative for dizziness, syncope, light-headedness, numbness and headaches.  Hematological: Negative for adenopathy.  Psychiatric/Behavioral: Positive for agitation and dysphoric mood. Negative for  confusion, sleep disturbance and suicidal ideas. The patient is not nervous/anxious.    BREAST: No symptoms   Objective: BP 106/80   Ht 5' 5.5" (1.664 m)   Wt 196 lb (88.9 kg)   BMI 32.12 kg/m    Physical Exam Constitutional:      Appearance: She is well-developed.  Genitourinary:     Vulva, vagina and right adnexa normal.     No vaginal discharge, erythema or tenderness.     Cervix is absent.  Uterus is absent.     No right or left adnexal mass present.     Right adnexa not tender.     Left adnexa absent.     Left adnexa not tender.     Genitourinary Comments: UTERUS/CX SURG REM  Neck:     Musculoskeletal: Normal range of motion.     Thyroid: No thyromegaly.  Cardiovascular:     Rate and Rhythm: Normal rate and regular rhythm.     Heart sounds: Normal heart sounds. No murmur.  Pulmonary:     Effort: Pulmonary effort is normal.     Breath sounds: Normal breath sounds.  Chest:     Breasts:        Right: No mass, nipple discharge, skin change or tenderness.        Left: No mass, nipple discharge, skin change or tenderness.  Abdominal:     Palpations: Abdomen is soft.     Tenderness: There is no abdominal tenderness. There is no guarding.  Musculoskeletal: Normal range of motion.  Neurological:     General: No focal deficit present.     Mental Status: She is alert and oriented to person, place, and time.     Cranial Nerves: No cranial nerve deficit.  Skin:    General: Skin is warm and dry.  Psychiatric:        Mood and Affect: Mood normal.        Behavior: Behavior normal.        Thought Content: Thought content normal.        Judgment: Judgment normal.  Vitals signs reviewed.     Assessment/Plan: Encounter for annual routine gynecological examination  Cervical cancer screening - Plan: Cytology - PAP  Screening for HPV (human papillomavirus) - Plan: Cytology - PAP  Encounter for screening mammogram for malignant neoplasm of breast--pt current on mammo   Perimenopausal vasomotor symptoms--Discussed trying effexor/prozac or progesterone. Pt to consider and f/u prn. Add exercise.  Anxiety and depression--pt may want to try effexor for hot flashes and sx. Will f/u if desires.   Screening for colon cancer--pt has appt sched 1/21  Needs flu shot - Plan: Flu Vaccine QUAD 36+ mos IM (Fluarix, Quad PF)            GYN counsel breast self exam, mammography screening, menopause, adequate intake of calcium and vitamin D, diet and exercise     F/U  Return in about 1 year (around 08/21/2020).  Zoanne Newill B. Geriann Lafont, PA-C 08/22/2019 9:17 AM

## 2019-08-22 ENCOUNTER — Ambulatory Visit (INDEPENDENT_AMBULATORY_CARE_PROVIDER_SITE_OTHER): Payer: 59 | Admitting: Obstetrics and Gynecology

## 2019-08-22 ENCOUNTER — Other Ambulatory Visit: Payer: Self-pay

## 2019-08-22 ENCOUNTER — Other Ambulatory Visit (HOSPITAL_COMMUNITY)
Admission: RE | Admit: 2019-08-22 | Discharge: 2019-08-22 | Disposition: A | Discharge status: Home / Self Care | Payer: 59 | Source: Ambulatory Visit | Attending: Obstetrics and Gynecology | Admitting: Obstetrics and Gynecology

## 2019-08-22 ENCOUNTER — Encounter: Payer: Self-pay | Admitting: Obstetrics and Gynecology

## 2019-08-22 VITALS — BP 106/80 | Ht 65.5 in | Wt 196.0 lb

## 2019-08-22 DIAGNOSIS — F32A Depression, unspecified: Secondary | ICD-10-CM | POA: Insufficient documentation

## 2019-08-22 DIAGNOSIS — Z124 Encounter for screening for malignant neoplasm of cervix: Secondary | ICD-10-CM | POA: Diagnosis not present

## 2019-08-22 DIAGNOSIS — Z1211 Encounter for screening for malignant neoplasm of colon: Secondary | ICD-10-CM

## 2019-08-22 DIAGNOSIS — Z23 Encounter for immunization: Secondary | ICD-10-CM | POA: Diagnosis not present

## 2019-08-22 DIAGNOSIS — F329 Major depressive disorder, single episode, unspecified: Secondary | ICD-10-CM | POA: Insufficient documentation

## 2019-08-22 DIAGNOSIS — Z01419 Encounter for gynecological examination (general) (routine) without abnormal findings: Secondary | ICD-10-CM | POA: Diagnosis not present

## 2019-08-22 DIAGNOSIS — N951 Menopausal and female climacteric states: Secondary | ICD-10-CM | POA: Insufficient documentation

## 2019-08-22 DIAGNOSIS — Z1151 Encounter for screening for human papillomavirus (HPV): Secondary | ICD-10-CM | POA: Insufficient documentation

## 2019-08-22 DIAGNOSIS — F419 Anxiety disorder, unspecified: Secondary | ICD-10-CM

## 2019-08-22 DIAGNOSIS — Z1231 Encounter for screening mammogram for malignant neoplasm of breast: Secondary | ICD-10-CM

## 2019-08-22 NOTE — Patient Instructions (Signed)
I value your feedback and entrusting us with your care. If you get a [facility] patient survey, I would appreciate you taking the time to let us know about your experience today. Thank you! 

## 2019-08-23 ENCOUNTER — Encounter: Payer: Self-pay | Admitting: *Deleted

## 2019-08-29 LAB — CYTOLOGY - PAP
Adequacy: ABSENT
Comment: NEGATIVE
Diagnosis: NEGATIVE
High risk HPV: NEGATIVE

## 2019-09-13 ENCOUNTER — Ambulatory Visit: Payer: 59 | Admitting: Psychology

## 2019-10-16 ENCOUNTER — Other Ambulatory Visit: Payer: Self-pay | Admitting: Family Medicine

## 2019-11-22 ENCOUNTER — Encounter: Payer: 59 | Admitting: Podiatry

## 2019-11-27 ENCOUNTER — Telehealth: Payer: Self-pay | Admitting: Gastroenterology

## 2019-11-27 NOTE — Telephone Encounter (Signed)
Pt left vm regarding a call and a letter she received to schedule a colonoscopy

## 2019-11-27 NOTE — Telephone Encounter (Signed)
Contacted patient to schedule her for screening colonoscopy.  During conversation, she stated that she has been experiencing some reflux-she has been taking omeprazole.  Reviewed her procedures, and offered an appt.  Office visit has been scheduled with Dr. Marius Ditch for a new patient virtual visit.  Patients last EGD was on 10/25/13 at [facility] noted: Normal esophagus, single 73mm polyp behind pylorus-which biopsy was taken to check for h-pylori.  This is in patients chart.  She is due for her screening colonoscopy.  Thanks,  Sharyn Lull

## 2019-11-29 ENCOUNTER — Ambulatory Visit (INDEPENDENT_AMBULATORY_CARE_PROVIDER_SITE_OTHER): Payer: No Typology Code available for payment source | Admitting: Podiatry

## 2019-11-29 ENCOUNTER — Ambulatory Visit (INDEPENDENT_AMBULATORY_CARE_PROVIDER_SITE_OTHER): Payer: No Typology Code available for payment source

## 2019-11-29 ENCOUNTER — Encounter: Payer: Self-pay | Admitting: Podiatry

## 2019-11-29 ENCOUNTER — Other Ambulatory Visit: Payer: Self-pay

## 2019-11-29 DIAGNOSIS — M7989 Other specified soft tissue disorders: Secondary | ICD-10-CM | POA: Diagnosis not present

## 2019-11-29 DIAGNOSIS — M778 Other enthesopathies, not elsewhere classified: Secondary | ICD-10-CM | POA: Diagnosis not present

## 2019-11-29 NOTE — Progress Notes (Signed)
She presents today for follow-up of her surgical foot.  She is also complete concerned about swelling of the bilateral lower extremities.  She states that she has been wearing compression hose but has not been wearing them lately because of the swelling.  Objective: Vital signs are stable she is alert oriented x3 this is a postsurgical foot right with mild swelling in the first intermetatarsal space radiographically it appears to be perfectly normal everything is normal swelling is gone down to its normal level presurgical.  The toe works great she has no loosening of the internal fixation.  Assessment: Mild edema to the bilateral lower extremity well-healed surgical foot right.  Plan: Wrote a prescription for midlevel compression knee-high.  Follow-up with her as needed.

## 2019-11-30 ENCOUNTER — Telehealth: Payer: Self-pay | Admitting: Family Medicine

## 2019-11-30 NOTE — Telephone Encounter (Signed)
Left message that is looks like Dr. Milinda Pointer ordered her a 20-86mmHg - knee high - black,  yesterday per patient's chart. Dr. Danise Mina gave her a script back in 08/2015 for script for knee high 10-58mmHg strength. I ask that she call me back if she has any further questions.

## 2019-11-30 NOTE — Telephone Encounter (Signed)
Spoke with [place].  She is concerned about the 20-30 mmHG script for compression stockings that she got from [facility].  She states they told her they normally don't order compression stockings and since we had prescribed 10-23mmHG in the past, she is concerned what Dr. Milinda Pointer wrote for will be too tight.  She is asking if Dr. Diona Browner can write her a new Rx for the 10-20 mmHG knee high/black compression stockings.  Tonnya is aware that Dr. Diona Browner will not be back in the office until Friday.

## 2019-11-30 NOTE — Telephone Encounter (Signed)
Patient stated she had previously been prescribed compression socks. She wanted to know if we could tell what type of socks she was prescribed/ Patient is requesting a call back

## 2019-12-01 NOTE — Telephone Encounter (Signed)
Rx in [employer]

## 2019-12-01 NOTE — Telephone Encounter (Signed)
Lacey Decker notified by telephone that her prescription for her compression stockings is ready to be picked up at the front desk

## 2019-12-11 ENCOUNTER — Telehealth: Payer: Self-pay

## 2019-12-11 NOTE — Telephone Encounter (Signed)
I spoke with pt; pt has had lt breast pain or soreness for awhile; pt has had mammogram and pt has already seen Dr Diona Browner about this. No SOB and no other covid symptoms, No real CP pt thinks breast pain.no travel and no known exposure to covid. Pt rescheduled in office appt with Dr Diona Browner for 12/12/19 at 11 AM. FYI to Dr Diona Browner. UC & ED precautions given and pt voiced understanding. FYI to Dr Diona Browner.

## 2019-12-12 ENCOUNTER — Ambulatory Visit (INDEPENDENT_AMBULATORY_CARE_PROVIDER_SITE_OTHER): Payer: No Typology Code available for payment source | Admitting: Family Medicine

## 2019-12-12 ENCOUNTER — Other Ambulatory Visit: Payer: Self-pay

## 2019-12-12 ENCOUNTER — Encounter: Payer: Self-pay | Admitting: Family Medicine

## 2019-12-12 VITALS — BP 132/82 | HR 92 | Temp 98.0°F | Ht 65.5 in | Wt 200.8 lb

## 2019-12-12 DIAGNOSIS — R0789 Other chest pain: Secondary | ICD-10-CM | POA: Diagnosis not present

## 2019-12-12 DIAGNOSIS — F410 Panic disorder [episodic paroxysmal anxiety] without agoraphobia: Secondary | ICD-10-CM

## 2019-12-12 DIAGNOSIS — F411 Generalized anxiety disorder: Secondary | ICD-10-CM

## 2019-12-12 DIAGNOSIS — K2210 Ulcer of esophagus without bleeding: Secondary | ICD-10-CM | POA: Diagnosis not present

## 2019-12-12 MED ORDER — GABAPENTIN 100 MG PO CAPS: 100.0000 mg | ORAL_CAPSULE | Freq: Every day | ORAL | 0 refills | Status: DC

## 2019-12-12 MED ORDER — LORAZEPAM 0.5 MG PO TABS: 0.5000 mg | ORAL_TABLET | Freq: Two times a day (BID) | ORAL | 0 refills | Status: DC

## 2019-12-12 NOTE — Patient Instructions (Addendum)
Keep appt with Pervis Hocking.  Can use Ativan twice daily as needed for anxiety.  Can try gabapentin 100 mg for chest wall pain/anxiety... use first time at night as may make you feel sleepy. Keep appt with GI for further treatment and eval of reflux.  Follow up if not improving as expected.

## 2019-12-12 NOTE — Telephone Encounter (Signed)
Noted  

## 2019-12-12 NOTE — Progress Notes (Signed)
Chief Complaint  Patient presents with  . chest/breast pain    on going issue , unsure if it is from grief or her anxiety     History of Present Illness: HPI  49 year old female with history of GAD, bipolar disorder presents with continued chrnic breat/chest wall pain off and on since 2019.   Mammogram to look into issue  9.2019 unremarkable and again in  06/2019  asymmetry noted... diagnostic performed.. unremarkable overlapping fatty tissue seen. 1 year recall recommended  She continues counseling with Dr. Rexene Edison. Has appt upcoming in 2 weeks, cannot get sooner.  She reports she is more worried about the chest pain now.. I cannot differnetiate between physical and emotional pain. Work in [place] more stressful.   She feels very stressed and grieveing... loss of her first child's father 2 months ago, her best friend. He overdose on drugs.  He was the only one who never " abandoned her"  She took  Ativan  0.5 mg when severely anxious with his death. Calmed her down some. Depakote helped her bipolar.  GERD: previously she had noted chest pain improved some with PPI... now has worsened again despite prilosec. Has appt with GI upcoming in 1 week.  This visit occurred during the SARS-CoV-2 public health emergency.  Safety protocols were in place, including screening questions prior to the visit, additional usage of staff PPE, and extensive cleaning of exam room while observing appropriate contact time as indicated for disinfecting solutions.   COVID 19 screen:  No recent travel or known exposure to COVID19 The patient denies respiratory symptoms of COVID 19 at this time. The importance of social distancing was discussed today.     Review of Systems  Constitutional: Negative for chills and fever.  HENT: Negative for congestion and ear pain.   Eyes: Negative for pain and redness.  Respiratory: Negative for cough and shortness of breath.   Cardiovascular: Positive for chest pain.  Negative for palpitations and leg swelling.  Gastrointestinal: Negative for abdominal pain, blood in stool, constipation, diarrhea, nausea and vomiting.  Genitourinary: Negative for dysuria.  Musculoskeletal: Positive for myalgias. Negative for falls.  Skin: Negative for rash.  Neurological: Negative for dizziness.  Psychiatric/Behavioral: Positive for depression. Negative for hallucinations, substance abuse and suicidal ideas. The patient is nervous/anxious.       Past Medical History:  Diagnosis Date  . Adenomyosis   . Alcohol abuse    last drink one year ago  . Anxiety   . Bipolar 1 disorder ([place])   . Chlamydia    as a teen  . Endometriosis   . MRSA (methicillin resistant staph aureus) culture positive   . PMDD (premenstrual dysphoric disorder)   . SUI (stress urinary incontinence, female)     reports that she has never smoked. She has never used smokeless tobacco. She reports that she does not drink alcohol or use drugs.   Current Outpatient Medications:  .  cetirizine (ZYRTEC) 10 MG tablet, Take 10 mg by mouth daily., Disp: , Rfl:  .  diclofenac sodium (VOLTAREN) 1 % GEL, Apply 2 g topically 4 (four) times daily., Disp: 100 g, Rfl: 3 .  fluticasone (FLONASE) 50 MCG/ACT nasal spray, USE 2 SPRAYS IN EACH  NOSTRIL DAILY, Disp: 48 g, Rfl: 3 .  omeprazole (PRILOSEC) 40 MG capsule, Take 1 capsule (40 mg total) by mouth daily., Disp: 90 capsule, Rfl: 1 .  LORazepam (ATIVAN) 0.5 MG tablet, Take 1 tablet (0.5 mg total) by mouth every  12 (twelve) hours., Disp: 60 tablet, Rfl: 0   Observations/Objective: Blood pressure 132/82, pulse 92, temperature 98 F (36.7 C), height 5' 5.5" (1.664 m), weight 200 lb 12.8 oz (91.1 kg), SpO2 98 %.  Physical Exam Constitutional:      General: She is not in acute distress.    Appearance: Normal appearance. She is well-developed. She is not ill-appearing or toxic-appearing.  HENT:     Head: Normocephalic.     Right Ear: Hearing, tympanic membrane,  ear canal and external ear normal. Tympanic membrane is not erythematous, retracted or bulging.     Left Ear: Hearing, tympanic membrane, ear canal and external ear normal. Tympanic membrane is not erythematous, retracted or bulging.     Nose: No mucosal edema or rhinorrhea.     Right Sinus: No maxillary sinus tenderness or frontal sinus tenderness.     Left Sinus: No maxillary sinus tenderness or frontal sinus tenderness.     Mouth/Throat:     Pharynx: Uvula midline.  Eyes:     General: Lids are normal. Lids are everted, no foreign bodies appreciated.     Conjunctiva/sclera: Conjunctivae normal.     Pupils: Pupils are equal, round, and reactive to light.  Neck:     Thyroid: No thyroid mass or thyromegaly.     Vascular: No carotid bruit.     Trachea: Trachea normal.  Cardiovascular:     Rate and Rhythm: Normal rate and regular rhythm.     Pulses: Normal pulses.     Heart sounds: Normal heart sounds, S1 normal and S2 normal. No murmur. No friction rub. No gallop.   Pulmonary:     Effort: Pulmonary effort is normal. No tachypnea or respiratory distress.     Breath sounds: Normal breath sounds. No decreased breath sounds, wheezing, rhonchi or rales.  Chest:     Chest wall: Tenderness present. No mass.     Comments: ttp over left costochondral junction Abdominal:     General: Bowel sounds are normal.     Palpations: Abdomen is soft.     Tenderness: There is no abdominal tenderness.  Musculoskeletal:     Cervical back: Normal range of motion and neck supple.  Skin:    General: Skin is warm and dry.     Findings: No rash.  Neurological:     Mental Status: She is alert.  Psychiatric:        Mood and Affect: Mood is not anxious or depressed.        Speech: Speech normal.        Behavior: Behavior normal. Behavior is cooperative.        Thought Content: Thought content normal.        Judgment: Judgment normal.      Assessment and Plan   Generalized anxiety disorder She has poor  control of her anxiety and is perseverating on her chest wall pain.  Discussed treatment options.. has not tolerated many meds in past, has fear of meds. Cannot start SSRI alone given bipolar and she does not want to restart mood stabilizer.   A lot of current issue is situation with loss of loved one. Will continue ativan but BID regularly. Follow up if not improving and needing continued ativan rx. She will restart counseling as planned. She will look into grief support group.    Erosive esophagitis Poor control on PPI.. may be contributing to chest pain. Keep follow up with GI as planned.  Anterior chest wall pain Most likely  costochondritis and anxiety associated pain. Possible contribution from GERD pain. Start mood treatment and chest wall stretches.   reassure pt, apin is atypical for cardia or pulmonary source, Exam wnl.     Eliezer Lofts, MD

## 2019-12-13 NOTE — Assessment & Plan Note (Addendum)
Most likely costochondritis and anxiety associated pain. Possible contribution from GERD pain. Start mood treatment and chest wall stretches.   reassure pt, apin is atypical for cardia or pulmonary source, Exam wnl.

## 2019-12-13 NOTE — Assessment & Plan Note (Addendum)
Poor control on PPI.. may be contributing to chest pain. Keep follow up with GI as planned.

## 2019-12-13 NOTE — Assessment & Plan Note (Signed)
She has poor control of her anxiety and is perseverating on her chest wall pain.  Discussed treatment options.. has not tolerated many meds in past, has fear of meds. Cannot start SSRI alone given bipolar and she does not want to restart mood stabilizer.   A lot of current issue is situation with loss of loved one. Will continue ativan but BID regularly. Follow up if not improving and needing continued ativan rx. She will restart counseling as planned. She will look into grief support group.

## 2019-12-14 ENCOUNTER — Ambulatory Visit: Payer: Self-pay | Admitting: Family Medicine

## 2019-12-20 ENCOUNTER — Encounter: Payer: Self-pay | Admitting: Gastroenterology

## 2019-12-20 ENCOUNTER — Telehealth: Payer: Self-pay

## 2019-12-20 ENCOUNTER — Other Ambulatory Visit: Payer: Self-pay

## 2019-12-20 ENCOUNTER — Telehealth: Payer: Self-pay | Admitting: Family Medicine

## 2019-12-20 ENCOUNTER — Ambulatory Visit (INDEPENDENT_AMBULATORY_CARE_PROVIDER_SITE_OTHER): Payer: No Typology Code available for payment source | Admitting: Gastroenterology

## 2019-12-20 DIAGNOSIS — R1310 Dysphagia, unspecified: Secondary | ICD-10-CM

## 2019-12-20 DIAGNOSIS — K2100 Gastro-esophageal reflux disease with esophagitis, without bleeding: Secondary | ICD-10-CM | POA: Diagnosis not present

## 2019-12-20 DIAGNOSIS — R1319 Other dysphagia: Secondary | ICD-10-CM

## 2019-12-20 DIAGNOSIS — D539 Nutritional anemia, unspecified: Secondary | ICD-10-CM

## 2019-12-20 DIAGNOSIS — Z1211 Encounter for screening for malignant neoplasm of colon: Secondary | ICD-10-CM

## 2019-12-20 MED ORDER — ESOMEPRAZOLE MAGNESIUM 40 MG PO PACK: 40.0000 mg | PACK | Freq: Two times a day (BID) | ORAL | 0 refills | Status: DC

## 2019-12-20 MED ORDER — SUCRALFATE 1 GM/10ML PO SUSP: 1.0000 g | Freq: Four times a day (QID) | ORAL | 1 refills | Status: DC

## 2019-12-20 NOTE — Progress Notes (Signed)
Sherri Sear, MD [address]  [place]  [place], [place] [address]  Main: [phone]  Fax: [phone]    Gastroenterology Consultation Video Visit  Referring Provider:     [name], MD Primary Care Physician:  [name], MD Primary Gastroenterologist:  Dr. [name] Reason for Consultation:     Dysphagia, GERD        HPI:   Lacey Decker is a 49 y.o. female referred by Dr. [name], MD  for consultation & management of dysphagia, GERD  Virtual Visit Video Note  I connected with [name] on 12/20/19 at 10:45 AM EST by video and verified that I am speaking with the correct person using two identifiers.   I discussed the limitations, risks, security and privacy concerns of performing an evaluation and management service by video and the availability of in person appointments. I also discussed with the patient that there may be a patient responsible charge related to this service. The patient expressed understanding and agreed to proceed.  Location of the Patient: Home  Location of the provider: Office  Persons participating in the visit: Patient and provider only   History of Present Illness:   Lacey Decker is a 49 year old female with history of anxiety, bipolar, s/p hysterectomy secondary to endometriosis is seen in consultation for chronic reflux, dysphagia.  Patient has been experiencing severe heartburn, difficulty swallowing solid food, food getting stuck in her chest especially hard meats, nocturnal regurgitation as well as burning in her chest.  The symptoms have been going on for several years, who was previously seen and evaluated by cardiology clinic gastroenterology.  Patient underwent 3 endoscopies within last 10 years, found to have erosive esophagitis.  Unable to obtain pathology report and whether esophageal biopsies were performed.  Patient was previously taking Nexium which helped but her insurance did not cover.  Then  switched to omeprazole 40 mg 2 times a day since then.  She thinks the omeprazole has stopped working lately because she is having more flareups.  She also underwent pH study 3 to 4 years ago and she says her chest pain started after the study.  She was recommended to undergo antireflux surgery which she was reluctant to at that time.  She does notice hoarseness in her voice.  She tends to have late dinners due to her work.  She does not drink coffee.  She could identify triggers such as greasy foods, chocolate and sometimes cannot stop avoiding them.  She thinks her life is miserable and has nothing else left to eat if she follows antireflux lifestyle.  She is asking if she can try different acid suppressive medication.  She says she may have tried Protonix or Dexilant in the past that may have caused back pain, could not confirm which one.  Review of her labs also revealed macrocytic anemia from 2019.  Unknown B12 and folate levels since then She does not smoke or drink alcohol  NSAIDs: None  Antiplts/Anticoagulants/Anti thrombotics: None  GI Procedures: EGD in 2014, normal EGD in 2012, erosive esophagitis and duodenal polyp removed EGD in 2011  Past Medical History:  Diagnosis Date  . Adenomyosis   . Alcohol abuse    last drink one year ago  . Anxiety   . Bipolar 1 disorder ([place])   . Chlamydia    as a teen  . Endometriosis   . MRSA (methicillin resistant staph aureus) culture positive   . PMDD (  premenstrual dysphoric disorder)   . SUI (stress urinary incontinence, female)     Past Surgical History:  Procedure Laterality Date  . ABDOMINAL HYSTERECTOMY  2009   total lap hyst, LSO due to adenomyosis/endometriosis  . btl  11/09/1998  . DILATION AND CURETTAGE OF UTERUS  1997   after miscarriage  . LEEP  2000  . misscarriage     x5  . NEUROMA SURGERY     lipoma removal Right labia  . nsvd     3    Current Outpatient Medications:  .  cetirizine (ZYRTEC) 10 MG tablet, Take 10  mg by mouth daily., Disp: , Rfl:  .  diclofenac sodium (VOLTAREN) 1 % GEL, Apply 2 g topically 4 (four) times daily., Disp: 100 g, Rfl: 3 .  fluticasone (FLONASE) 50 MCG/ACT nasal spray, USE 2 SPRAYS IN EACH  NOSTRIL DAILY, Disp: 48 g, Rfl: 3 .  LORazepam (ATIVAN) 0.5 MG tablet, Take 1 tablet (0.5 mg total) by mouth every 12 (twelve) hours., Disp: 60 tablet, Rfl: 0 .  omeprazole (PRILOSEC) 40 MG capsule, Take 1 capsule (40 mg total) by mouth daily., Disp: 90 capsule, Rfl: 1 .  esomeprazole (NEXIUM) 40 MG packet, Take 40 mg by mouth 2 (two) times daily before a meal., Disp: 60 each, Rfl: 0 .  gabapentin (NEURONTIN) 100 MG capsule, Take 1 capsule (100 mg total) by mouth at bedtime. (Patient not taking: Reported on 12/20/2019), Disp: 30 capsule, Rfl: 0 .  sucralfate (CARAFATE) 1 GM/10ML suspension, Take 10 mLs (1 g total) by mouth 4 (four) times daily., Disp: 420 mL, Rfl: 1    Family History  Problem Relation Age of Onset  . Hypertension Mother   . Hyperlipidemia Mother   . Depression Mother   . Basal cell carcinoma Mother   . Colon polyps Mother   . Bipolar disorder Sister   . Depression Maternal Grandmother   . Lung cancer Maternal Grandfather   . Breast cancer Neg Hx      Social History   Tobacco Use  . Smoking status: Never Smoker  . Smokeless tobacco: Never Used  Substance Use Topics  . Alcohol use: No    Alcohol/week: 0.0 standard drinks  . Drug use: No    Allergies as of 12/20/2019 - Review Complete 12/20/2019  Allergen Reaction Noted  . Codeine    . Morphine    . Augmentin [amoxicillin-pot clavulanate]  11/06/2015  . Clarithromycin  07/05/2010  . Erythromycin base Other (See Comments) 07/22/2012  . Sulfamethoxazole-trimethoprim       Imaging Studies: Reviewed  Assessment and Plan:   TYONNA MINETTI is a 49 y.o. female with obesity, chronic GERD, dysphagia history of erosive esophagitis, maintained on long-term PPI with uncontrolled GERD.  Also, has macrocytic  anemia  Poorly controlled GERD/dysphagia Recommend EGD with esophageal biopsies for further evaluation, also rule out peptic stricture, screen for Barrett's given chronicity of symptoms Switch to Nexium 40 mg twice daily before meals Reiterated about antireflux lifestyle Also, discussed with her that she might benefit from antireflux surgery based on the EGD findings She will need repeat pH impedance study and esophageal manometry if she is willing to proceed with antireflux surgery  Macrocytic anemia Recheck CBC, iron studies, B12 and folate panel  Colon cancer screening, average risk Schedule colonoscopy along with EGD   Follow Up Instructions:   I discussed the assessment and treatment plan with the patient. The patient was provided an opportunity to ask questions and all were  answered. The patient agreed with the plan and demonstrated an understanding of the instructions.   The patient was advised to call back or seek an in-person evaluation if the symptoms worsen or if the condition fails to improve as anticipated.  I provided 25 minutes of face-to-face time during this encounter.   Follow up in 4 to 6 weeks   [name], MD

## 2019-12-20 NOTE — Telephone Encounter (Signed)
Thank you for calming her down and reassuring her. I am not sure what hemoglobin she was referring to that was " perilously' low It has not be checked since 2019.  And then was only slightly low at 11.7. Not checked in 07/2019. Did she mention what the number was and who checked it ? Did you mean perilously or previously? ( looks like GI labs are pending)

## 2019-12-20 NOTE — Telephone Encounter (Signed)
Patient has been scheduled her Colon w/EGD on Wed March 3rd at [facility]  She has been advised of mandatory COVID test on Monday 01/08/20 at [facility] between 10:30am and 12:30pm.  Instructions will be sent via mychart and mail.  Thanks,  [place], [place]

## 2019-12-20 NOTE — Telephone Encounter (Signed)
Patient was transferred to triage because she was very upset. Patient stated that she went to see Dr. Marius Ditch today and she told her that she needed additional lab work done because her hemoglobin had been low perilously. Patient stated that she does not understand because Dr. Diona Browner has told her all along her lab work has looked fine. Advised patient that she has not had any lab work by Dr. Diona Browner for several months. Advised patient that the doctor is doing a work up that she feels is needed to best care for her. Patient stated that she is afraid that something is wrong with her and she is scared. Patient stated that she has not taken her Ativan today, but she will take it now. Patient stated that she has an appointment next with with Dr. Sharla Kidney, but will call and see if she can get on a cancellation list to try to get in sooner. Offered patient an appointment with Dr. Diona Browner which patient declined stating that she will be fine. Patient stated that she would not harm herself or anyone else. Patient stated that she is scheduled to have the additional lab work at 1:00 pm today and will go for the blood work. Patient was calmer when we finished the phone call. Advised patient if she changes her mind about an appointment with Dr. Diona Browner to call back and schedule it. ER precautions were given to patient and she verbalized understanding.Marland Kitchen

## 2019-12-20 NOTE — Telephone Encounter (Signed)
Sorry, I meant previously. Patient did not have any particular dates of lab work that was done previously. . Patient was advised  the last time Dr. Diona Browner had checked it was back in 2019 and it was slightly low. Patient stated that she was going for the lab work today and would wait for a call from GI with the results.

## 2019-12-20 NOTE — Telephone Encounter (Signed)
Pt is wanting someone to call her regarding She went to GI dr today and dr mention to her about labs hemiglobian and wanted to order more labs.  She stated dr Diona Browner told her everything on her labs was ok.  Pt was very upset and crying fear and anxiety

## 2019-12-21 ENCOUNTER — Telehealth: Payer: Self-pay | Admitting: Gastroenterology

## 2019-12-21 LAB — CBC
Hematocrit: 38.1 % (ref 34.0–46.6)
Hemoglobin: 12.9 g/dL (ref 11.1–15.9)
MCH: 33.2 pg — ABNORMAL HIGH (ref 26.6–33.0)
MCHC: 33.9 g/dL (ref 31.5–35.7)
MCV: 98 fL — ABNORMAL HIGH (ref 79–97)
Platelets: 294 10*3/uL (ref 150–450)
RBC: 3.89 x10E6/uL (ref 3.77–5.28)
RDW: 12.3 % (ref 11.7–15.4)
WBC: 7.3 10*3/uL (ref 3.4–10.8)

## 2019-12-21 LAB — IRON,TIBC AND FERRITIN PANEL
Ferritin: 172 ng/mL — ABNORMAL HIGH (ref 15–150)
Iron Saturation: 29 % (ref 15–55)
Iron: 86 ug/dL (ref 27–159)
Total Iron Binding Capacity: 297 ug/dL (ref 250–450)
UIBC: 211 ug/dL (ref 131–425)

## 2019-12-21 LAB — B12 AND FOLATE PANEL
Folate: 9.7 ng/mL (ref >3.0)
Vitamin B-12: 474 pg/mL (ref 232–1245)

## 2019-12-21 NOTE — Telephone Encounter (Signed)
Patient called & is up set!! She has received her results in My Chart & would like to speak with someone ASAP.Please call & advise

## 2019-12-21 NOTE — Telephone Encounter (Signed)
Called patient and discussed results with her and reassured her that no further testing is needed with regards to history of anemia.  Her hemoglobin in fact is now within normal limits.  Normal B12, folate and iron panel  Patient felt reassured and relieved  [name], MD [address]  [place]  [place], [place] [address]  Main: [phone]  Fax: [phone] Pager: [phone]

## 2019-12-25 ENCOUNTER — Ambulatory Visit (INDEPENDENT_AMBULATORY_CARE_PROVIDER_SITE_OTHER): Payer: No Typology Code available for payment source | Admitting: Psychology

## 2019-12-25 DIAGNOSIS — F331 Major depressive disorder, recurrent, moderate: Secondary | ICD-10-CM

## 2020-01-01 ENCOUNTER — Telehealth: Payer: Self-pay

## 2020-01-01 NOTE — Telephone Encounter (Signed)
Patient is calling because she states her pharmacy states she needs a PA on patient Nexium. Advised patient that we have not got a request from pharmacy stating this. Informed patient I would do PA right now and as soon as I heard from insurance company would give patient a call to inform patient.  Sent PA through cover my meds to [facility]

## 2020-01-02 NOTE — Telephone Encounter (Signed)
Called to check the status of the PA. Insurance company states it is still waiting on them to review the case

## 2020-01-04 ENCOUNTER — Telehealth: Payer: Self-pay

## 2020-01-04 NOTE — Telephone Encounter (Signed)
Patient insurance denied the Nexium. Please advised what medication you will want her to be on now

## 2020-01-05 MED ORDER — OMEPRAZOLE 40 MG PO CPDR: 40.0000 mg | DELAYED_RELEASE_CAPSULE | Freq: Two times a day (BID) | ORAL | 0 refills | Status: DC

## 2020-01-05 NOTE — Addendum Note (Signed)
Addended by: Ulyess Blossom L on: 01/05/2020 11:05 AM   Modules accepted: Orders

## 2020-01-05 NOTE — Addendum Note (Signed)
Addended by: Ulyess Blossom L on: 01/05/2020 10:58 AM   Modules accepted: Orders

## 2020-01-05 NOTE — Telephone Encounter (Signed)
Sent medication to pharmacy.   

## 2020-01-05 NOTE — Telephone Encounter (Signed)
Added on EGD to procedure. Called patient to inform patient of this information and the change in medication. Patient states Prevacid does not work for her and she will not take the medication. Patient states she wants to take Omeprazole again and wants a 90 day supply sent to [facility]

## 2020-01-05 NOTE — Telephone Encounter (Signed)
She needs EGD In the meantime try prevacid 30mg  daily before meals  RV

## 2020-01-05 NOTE — Telephone Encounter (Signed)
Ok, go ahead and send it in  RV

## 2020-01-08 ENCOUNTER — Telehealth: Payer: Self-pay

## 2020-01-08 ENCOUNTER — Other Ambulatory Visit
Admission: RE | Admit: 2020-01-08 | Discharge: 2020-01-08 | Disposition: A | Discharge status: Home / Self Care | Payer: No Typology Code available for payment source | Source: Ambulatory Visit | Attending: Gastroenterology | Admitting: Gastroenterology

## 2020-01-08 ENCOUNTER — Other Ambulatory Visit: Payer: Self-pay

## 2020-01-08 DIAGNOSIS — Z01812 Encounter for preprocedural laboratory examination: Secondary | ICD-10-CM | POA: Insufficient documentation

## 2020-01-08 DIAGNOSIS — Z20822 Contact with and (suspected) exposure to covid-19: Secondary | ICD-10-CM | POA: Insufficient documentation

## 2020-01-08 MED ORDER — RABEPRAZOLE SODIUM 20 MG PO TBEC: 20.0000 mg | DELAYED_RELEASE_TABLET | Freq: Every day | ORAL | 1 refills | Status: DC

## 2020-01-08 MED ORDER — GOLYTELY 236 G PO SOLR: 4000.0000 mL | Freq: Once | ORAL | 0 refills | Status: AC

## 2020-01-08 NOTE — Telephone Encounter (Signed)
Patient is calling because the Suprep s a 100 dollars. She states she would like something cheaper. Called in [place] to the pharmacy. Informed patient that she would drank 8oz every 20-30 minutes till it is gone

## 2020-01-08 NOTE — Telephone Encounter (Signed)
Per Dr. Marius Ditch she wants her to try Axiphex 30mg  BID. Called this in to the pharmacy

## 2020-01-09 LAB — SARS CORONAVIRUS 2 (TAT 6-24 HRS): SARS Coronavirus 2: NEGATIVE

## 2020-01-10 ENCOUNTER — Ambulatory Visit: Payer: No Typology Code available for payment source | Admitting: Certified Registered"

## 2020-01-10 ENCOUNTER — Encounter
Admission: RE | Disposition: A | Discharge status: Home / Self Care | Payer: Self-pay | Source: Home / Self Care | Attending: Gastroenterology

## 2020-01-10 ENCOUNTER — Ambulatory Visit
Admission: RE | Admit: 2020-01-10 | Discharge: 2020-01-10 | Disposition: A | Discharge status: Home / Self Care | Payer: No Typology Code available for payment source | Attending: Gastroenterology | Admitting: Gastroenterology

## 2020-01-10 ENCOUNTER — Encounter: Payer: Self-pay | Admitting: Gastroenterology

## 2020-01-10 ENCOUNTER — Other Ambulatory Visit: Payer: Self-pay

## 2020-01-10 DIAGNOSIS — R1310 Dysphagia, unspecified: Secondary | ICD-10-CM | POA: Diagnosis not present

## 2020-01-10 DIAGNOSIS — Z1211 Encounter for screening for malignant neoplasm of colon: Secondary | ICD-10-CM

## 2020-01-10 DIAGNOSIS — F319 Bipolar disorder, unspecified: Secondary | ICD-10-CM | POA: Diagnosis not present

## 2020-01-10 DIAGNOSIS — K635 Polyp of colon: Secondary | ICD-10-CM | POA: Diagnosis not present

## 2020-01-10 DIAGNOSIS — R1319 Other dysphagia: Secondary | ICD-10-CM

## 2020-01-10 DIAGNOSIS — K3189 Other diseases of stomach and duodenum: Secondary | ICD-10-CM | POA: Diagnosis not present

## 2020-01-10 DIAGNOSIS — K2100 Gastro-esophageal reflux disease with esophagitis, without bleeding: Secondary | ICD-10-CM

## 2020-01-10 DIAGNOSIS — K219 Gastro-esophageal reflux disease without esophagitis: Secondary | ICD-10-CM | POA: Diagnosis not present

## 2020-01-10 DIAGNOSIS — M1990 Unspecified osteoarthritis, unspecified site: Secondary | ICD-10-CM | POA: Diagnosis not present

## 2020-01-10 DIAGNOSIS — Z79899 Other long term (current) drug therapy: Secondary | ICD-10-CM | POA: Insufficient documentation

## 2020-01-10 DIAGNOSIS — D120 Benign neoplasm of cecum: Secondary | ICD-10-CM | POA: Diagnosis not present

## 2020-01-10 DIAGNOSIS — Z791 Long term (current) use of non-steroidal anti-inflammatories (NSAID): Secondary | ICD-10-CM | POA: Diagnosis not present

## 2020-01-10 HISTORY — DX: Gastro-esophageal reflux disease without esophagitis: K21.9

## 2020-01-10 HISTORY — PX: ESOPHAGOGASTRODUODENOSCOPY (EGD) WITH PROPOFOL: SHX5813

## 2020-01-10 HISTORY — PX: COLONOSCOPY WITH PROPOFOL: SHX5780

## 2020-01-10 SURGERY — COLONOSCOPY WITH PROPOFOL
Anesthesia: General

## 2020-01-10 MED ORDER — LIDOCAINE HCL (CARDIAC) PF 100 MG/5ML IV SOSY
PREFILLED_SYRINGE | INTRAVENOUS | Status: DC | PRN
  Administered 2020-01-10: 100 mg via INTRATRACHEAL

## 2020-01-10 MED ORDER — SODIUM CHLORIDE 0.9 % IV SOLN: INTRAVENOUS | Status: DC

## 2020-01-10 MED ORDER — GLYCOPYRROLATE 0.2 MG/ML IJ SOLN
INTRAMUSCULAR | Status: DC | PRN
  Administered 2020-01-10: .2 mg via INTRAVENOUS

## 2020-01-10 MED ORDER — PROPOFOL 10 MG/ML IV BOLUS
INTRAVENOUS | Status: AC
  Filled 2020-01-10: qty 20

## 2020-01-10 MED ORDER — PROPOFOL 500 MG/50ML IV EMUL
INTRAVENOUS | Status: DC | PRN
  Administered 2020-01-10: 165 ug/kg/min via INTRAVENOUS

## 2020-01-10 MED ORDER — PROPOFOL 10 MG/ML IV BOLUS
INTRAVENOUS | Status: DC | PRN
  Administered 2020-01-10: 10 mg via INTRAVENOUS
  Administered 2020-01-10: 70 mg via INTRAVENOUS
  Administered 2020-01-10: 10 mg via INTRAVENOUS

## 2020-01-10 MED ORDER — PHENYLEPHRINE HCL (PRESSORS) 10 MG/ML IV SOLN
INTRAVENOUS | Status: DC | PRN
  Administered 2020-01-10 (×2): 100 ug via INTRAVENOUS

## 2020-01-10 MED ORDER — MIDAZOLAM HCL 2 MG/2ML IJ SOLN
INTRAMUSCULAR | Status: AC
  Filled 2020-01-10: qty 2

## 2020-01-10 NOTE — Op Note (Addendum)
[facility] Gastroenterology Patient Name: Lacey Decker Procedure Date: 01/10/2020 11:27 AM MRN: [id number] Account #: [id number] Date of Birth: 1971-02-26 Admit Type: Outpatient Age: 49 Room: [facility] ENDO ROOM 3 Gender: Female Note Status: Finalized Procedure:             Colonoscopy Indications:           Screening for colorectal malignant neoplasm Providers:             Lin Landsman MD, MD Referring MD:          [name] MD, MD (Referring MD) Medicines:             Monitored Anesthesia Care Complications:         No immediate complications. Estimated blood loss: None. Procedure:             Pre-Anesthesia Assessment:                        - Prior to the procedure, a History and Physical was                         performed, and patient medications and allergies were                         reviewed. The patient is competent. The risks and                         benefits of the procedure and the sedation options and                         risks were discussed with the patient. All questions                         were answered and informed consent was obtained.                         Patient identification and proposed procedure were                         verified by the physician, the nurse, the                         anesthesiologist, the anesthetist and the technician                         in the pre-procedure area in the procedure room in the                         endoscopy suite. Mental Status Examination: alert and                         oriented. Airway Examination: normal oropharyngeal                         airway and neck mobility. Respiratory Examination:                         clear to auscultation. CV Examination: normal.  Prophylactic Antibiotics: The patient does not require                         prophylactic antibiotics. Prior Anticoagulants: The                         patient has taken no previous  anticoagulant or                         antiplatelet agents. ASA Grade Assessment: III - A                         patient with severe systemic disease. After reviewing                         the risks and benefits, the patient was deemed in                         satisfactory condition to undergo the procedure. The                         anesthesia plan was to use monitored anesthesia care                         (MAC). Immediately prior to administration of                         medications, the patient was re-assessed for adequacy                         to receive sedatives. The heart rate, respiratory                         rate, oxygen saturations, blood pressure, adequacy of                         pulmonary ventilation, and response to care were                         monitored throughout the procedure. The physical                         status of the patient was re-assessed after the                         procedure.                        After obtaining informed consent, the colonoscope was                         passed under direct vision. Throughout the procedure,                         the patient's blood pressure, pulse, and oxygen                         saturations were monitored continuously. The  Colonoscope was introduced through the anus and                         advanced to the the cecum, identified by appendiceal                         orifice and ileocecal valve. The colonoscopy was                         performed without difficulty. The patient tolerated                         the procedure well. The quality of the bowel                         preparation was good. Findings:      The perianal and digital rectal examinations were normal. Pertinent       negatives include normal sphincter tone and no palpable rectal lesions.      A diminutive polyp was found in the cecum. The polyp was sessile. The       polyp was removed  with a cold snare. Resection and retrieval were       complete.      The retroflexed view of the distal rectum and anal verge was normal and       showed no anal or rectal abnormalities.      The exam was otherwise without abnormality. Impression:            - One diminutive polyp in the cecum, removed with a                         cold snare. Resected and retrieved.                        - The distal rectum and anal verge are normal on                         retroflexion view.                        - The examination was otherwise normal. Recommendation:        - Discharge patient to home (with escort).                        - Resume previous diet today.                        - Continue present medications.                        - Await pathology results.                        - Repeat colonoscopy in 7-10 years for surveillance                         based on pathology results.                        - Return to my office as previously scheduled.  Procedure Code(s):     --- Professional ---                        [phone], Colonoscopy, flexible; with removal of                         tumor(s), polyp(s), or other lesion(s) by snare                         technique Diagnosis Code(s):     --- Professional ---                        Z12.11, Encounter for screening for malignant neoplasm                         of colon                        K63.5, Polyp of colon CPT copyright 2019 American Medical Association. All rights reserved. The codes documented in this report are preliminary and upon coder review may  be revised to meet current compliance requirements. Dr. Ulyess Mort Lin Landsman MD, MD 01/10/2020 12:04:03 PM This report has been signed electronically. Number of Addenda: 0 Note Initiated On: 01/10/2020 11:27 AM Scope Withdrawal Time: 0 hours 7 minutes 29 seconds  Total Procedure Duration: 0 hours 11 minutes 11 seconds  Estimated Blood Loss:  Estimated blood loss:  none.      [facility]

## 2020-01-10 NOTE — Anesthesia Preprocedure Evaluation (Signed)
Anesthesia Evaluation  Patient identified by MRN, date of birth, ID band Patient awake    Reviewed: Allergy & Precautions, H&P , NPO status , Patient's Chart, lab work & pertinent test results  History of Anesthesia Complications Negative for: history of anesthetic complications  Airway Mallampati: III  TM Distance: >3 FB Neck ROM: full    Dental  (+) Chipped, Poor Dentition   Pulmonary neg shortness of breath, sleep apnea ,           Cardiovascular Exercise Tolerance: Good (-) angina(-) DOE negative cardio ROS       Neuro/Psych  Headaches, PSYCHIATRIC DISORDERS  Neuromuscular disease    GI/Hepatic Neg liver ROS, PUD, GERD  Medicated and Controlled,  Endo/Other  negative endocrine ROS  Renal/GU negative Renal ROS  negative genitourinary   Musculoskeletal  (+) Arthritis ,   Abdominal   Peds  Hematology negative hematology ROS (+)   Anesthesia Other Findings Past Medical History: No date: Adenomyosis No date: Alcohol abuse     Comment:  last drink one year ago No date: Anxiety No date: Bipolar 1 disorder (HCC) No date: Chlamydia     Comment:  as a teen No date: Endometriosis No date: GERD (gastroesophageal reflux disease) No date: MRSA (methicillin resistant staph aureus) culture positive No date: PMDD (premenstrual dysphoric disorder) No date: SUI (stress urinary incontinence, female)  Past Surgical History: 2009: ABDOMINAL HYSTERECTOMY     Comment:  total lap hyst, LSO due to adenomyosis/endometriosis 11/09/1998: btl 1997: DILATION AND CURETTAGE OF UTERUS     Comment:  after miscarriage 2000: LEEP No date: misscarriage     Comment:  x5 No date: NEUROMA SURGERY     Comment:  lipoma removal Right labia No date: nsvd     Comment:  3  BMI    Body Mass Index: 32.45 kg/m      Reproductive/Obstetrics negative OB ROS                             Anesthesia  Physical Anesthesia Plan  ASA: III  Anesthesia Plan: General   Post-op Pain Management:    Induction: Intravenous  PONV Risk Score and Plan: Propofol infusion and TIVA  Airway Management Planned: Natural Airway and Nasal Cannula  Additional Equipment:   Intra-op Plan:   Post-operative Plan:   Informed Consent: I have reviewed the patients History and Physical, chart, labs and discussed the procedure including the risks, benefits and alternatives for the proposed anesthesia with the patient or authorized representative who has indicated his/her understanding and acceptance.     Dental Advisory Given  Plan Discussed with: Anesthesiologist, CRNA and Surgeon  Anesthesia Plan Comments: (Patient consented for risks of anesthesia including but not limited to:  - adverse reactions to medications - risk of intubation if required - damage to teeth, lips or other oral mucosa - sore throat or hoarseness - Damage to heart, brain, lungs or loss of life  Patient voiced understanding.)        Anesthesia Quick Evaluation

## 2020-01-10 NOTE — Anesthesia Postprocedure Evaluation (Signed)
Anesthesia Post Note  Patient: Lacey Decker  Procedure(s) Performed: COLONOSCOPY WITH PROPOFOL (N/A ) ESOPHAGOGASTRODUODENOSCOPY (EGD) WITH PROPOFOL (N/A )  Patient location during evaluation: Endoscopy Anesthesia Type: General Level of consciousness: awake and alert and oriented Pain management: pain level controlled Vital Signs Assessment: post-procedure vital signs reviewed and stable Respiratory status: spontaneous breathing, nonlabored ventilation and respiratory function stable Cardiovascular status: blood pressure returned to baseline and stable Postop Assessment: no signs of nausea or vomiting Anesthetic complications: no     Last Vitals:  Vitals:   01/10/20 1215 01/10/20 1225  BP: 106/61 111/70  Pulse: 82 70  Resp: (!) 22 14  Temp:    SpO2: 100% 100%    Last Pain:  Vitals:   01/10/20 1225  TempSrc:   PainSc: 0-No pain                 Kohan Azizi

## 2020-01-10 NOTE — Transfer of Care (Signed)
Immediate Anesthesia Transfer of Care Note  Patient: Lacey Decker  Procedure(s) Performed: COLONOSCOPY WITH PROPOFOL (N/A ) ESOPHAGOGASTRODUODENOSCOPY (EGD) WITH PROPOFOL (N/A )  Patient Location: Endoscopy Unit  Anesthesia Type:General  Level of Consciousness: drowsy, patient cooperative and responds to stimulation  Airway & Oxygen Therapy: Patient Spontanous Breathing and Patient connected to face mask oxygen  Post-op Assessment: Report given to RN and Post -op Vital signs reviewed and stable  Post vital signs: Reviewed and stable  Last Vitals:  Vitals Value Taken Time  BP    Temp    Pulse 82 01/10/20 1206  Resp 12 01/10/20 1206  SpO2 99 % 01/10/20 1206  Vitals shown include unvalidated device data.  Last Pain:  Vitals:   01/10/20 0949  TempSrc: Temporal  PainSc: 0-No pain         Complications: No apparent anesthesia complications

## 2020-01-10 NOTE — Op Note (Signed)
[facility] Gastroenterology Patient Name: Lacey Decker Procedure Date: 01/10/2020 11:28 AM MRN: [id number] Account #: [id number] Date of Birth: 1971-09-17 Admit Type: Outpatient Age: 49 Room: [facility] ENDO ROOM 3 Gender: Female Note Status: Finalized Procedure:             Upper GI endoscopy Indications:           Dysphagia, Esophageal reflux symptoms that persist                         despite appropriate therapy Providers:             Lin Landsman MD, MD Referring MD:          [name] MD, MD (Referring MD) Medicines:             Monitored Anesthesia Care Complications:         No immediate complications. Estimated blood loss:                         Minimal. Procedure:             Pre-Anesthesia Assessment:                        - Prior to the procedure, a History and Physical was                         performed, and patient medications and allergies were                         reviewed. The patient is competent. The risks and                         benefits of the procedure and the sedation options and                         risks were discussed with the patient. All questions                         were answered and informed consent was obtained.                         Patient identification and proposed procedure were                         verified by the physician, the nurse, the                         anesthesiologist, the anesthetist and the technician                         in the pre-procedure area in the procedure room in the                         endoscopy suite. Mental Status Examination: alert and                         oriented. Airway Examination: normal oropharyngeal  airway and neck mobility. Respiratory Examination:                         clear to auscultation. CV Examination: normal.                         Prophylactic Antibiotics: The patient does not require   prophylactic antibiotics. Prior Anticoagulants: The                         patient has taken no previous anticoagulant or                         antiplatelet agents. ASA Grade Assessment: III - A                         patient with severe systemic disease. After reviewing                         the risks and benefits, the patient was deemed in                         satisfactory condition to undergo the procedure. The                         anesthesia plan was to use monitored anesthesia care                         (MAC). Immediately prior to administration of                         medications, the patient was re-assessed for adequacy                         to receive sedatives. The heart rate, respiratory                         rate, oxygen saturations, blood pressure, adequacy of                         pulmonary ventilation, and response to care were                         monitored throughout the procedure. The physical                         status of the patient was re-assessed after the                         procedure.                        After obtaining informed consent, the endoscope was                         passed under direct vision. Throughout the procedure,                         the patient's blood pressure, pulse, and oxygen  saturations were monitored continuously. The Endoscope                         was introduced through the mouth, and advanced to the                         second part of duodenum. The upper GI endoscopy was                         accomplished without difficulty. The patient tolerated                         the procedure well. Findings:      An extrinsic deformity/raised lesion was found at the major papilla.       Biopsies were taken with a cold forceps for histology.      The duodenal bulb and second portion of the duodenum were normal.      The entire examined stomach was normal. Biopsies were taken  with a cold       forceps for Helicobacter pylori testing.      The cardia and gastric fundus were normal on retroflexion.      Esophagogastric landmarks were identified: the gastroesophageal junction       was found at 38 cm from the incisors.      The gastroesophageal junction and examined esophagus were normal.       Biopsies were taken with a cold forceps for histology. Impression:            - Duodenal deformity. Biopsied.                        - Normal duodenal bulb and second portion of the                         duodenum.                        - Normal stomach. Biopsied.                        - Esophagogastric landmarks identified.                        - Normal gastroesophageal junction and esophagus.                         Biopsied. Recommendation:        - Await pathology results.                        - Continue present medications.                        - Return to my office as previously scheduled.                        - Proceed with colonoscopy as scheduled                        See colonoscopy report Procedure Code(s):     --- Professional ---  73567, Esophagogastroduodenoscopy, flexible,                         transoral; with biopsy, single or multiple Diagnosis Code(s):     --- Professional ---                        K31.89, Other diseases of stomach and duodenum                        R13.10, Dysphagia, unspecified                        K21.9, Gastro-esophageal reflux disease without                         esophagitis CPT copyright 2019 American Medical Association. All rights reserved. The codes documented in this report are preliminary and upon coder review may  be revised to meet current compliance requirements. Dr. Ulyess Mort Lin Landsman MD, MD 01/10/2020 11:49:12 AM This report has been signed electronically. Number of Addenda: 0 Note Initiated On: 01/10/2020 11:28 AM Estimated Blood Loss:  Estimated blood loss was  minimal.      [facility]

## 2020-01-10 NOTE — H&P (Signed)
[name], MD [address], [place] [address]  Main: [phone]  Fax: [phone] Pager: [phone]  Primary Care Physician:  [name], MD Primary Gastroenterologist:  Dr. [name]  Pre-Procedure History & Physical: HPI:  Lacey Decker is a 50 y.o. female is here for an endoscopy and colonoscopy.   Past Medical History:  Diagnosis Date  . Adenomyosis   . Alcohol abuse    last drink one year ago  . Anxiety   . Bipolar 1 disorder ([place])   . Chlamydia    as a teen  . Endometriosis   . GERD (gastroesophageal reflux disease)   . MRSA (methicillin resistant staph aureus) culture positive   . PMDD (premenstrual dysphoric disorder)   . SUI (stress urinary incontinence, female)     Past Surgical History:  Procedure Laterality Date  . ABDOMINAL HYSTERECTOMY  2009   total lap hyst, LSO due to adenomyosis/endometriosis  . btl  11/09/1998  . DILATION AND CURETTAGE OF UTERUS  1997   after miscarriage  . LEEP  2000  . misscarriage     x5  . NEUROMA SURGERY     lipoma removal Right labia  . nsvd     3    Prior to Admission medications   Medication Sig Start Date End Date Taking? Authorizing Provider  cetirizine (ZYRTEC) 10 MG tablet Take 10 mg by mouth daily.    [provider]  diclofenac sodium (VOLTAREN) 1 % GEL Apply 2 g topically 4 (four) times daily. 09/02/18   Bedsole, Amy E, MD  esomeprazole (NEXIUM) 40 MG packet Take 40 mg by mouth 2 (two) times daily before a meal. 12/20/19 01/19/20  Shealynn Saulnier, Tally Due, MD  fluticasone (FLONASE) 50 MCG/ACT nasal spray USE 2 SPRAYS IN EACH  NOSTRIL DAILY 10/17/19   Bedsole, Amy E, MD  gabapentin (NEURONTIN) 100 MG capsule Take 1 capsule (100 mg total) by mouth at bedtime. Patient not taking: Reported on 12/20/2019 12/12/19   [name], MD  LORazepam (ATIVAN) 0.5 MG tablet Take 1 tablet (0.5 mg total) by mouth every 12 (twelve) hours. 12/12/19   Bedsole, Amy E, MD  omeprazole  (PRILOSEC) 40 MG capsule Take 1 capsule (40 mg total) by mouth daily. 07/27/19   Bedsole, Amy E, MD  omeprazole (PRILOSEC) 40 MG capsule Take 1 capsule (40 mg total) by mouth 2 (two) times daily before a meal. 01/05/20 04/04/20  Keyundra Fant, Tally Due, MD  RABEprazole (ACIPHEX) 20 MG tablet Take 1 tablet (20 mg total) by mouth daily. 01/08/20   Lin Landsman, MD  sucralfate (CARAFATE) 1 GM/10ML suspension Take 10 mLs (1 g total) by mouth 4 (four) times daily. 12/20/19   Lin Landsman, MD    Allergies as of 12/20/2019 - Review Complete 12/20/2019  Allergen Reaction Noted  . Codeine    . Morphine    . Augmentin [amoxicillin-pot clavulanate]  11/06/2015  . Clarithromycin  07/05/2010  . Erythromycin base Other (See Comments) 07/22/2012  . Sulfamethoxazole-trimethoprim      Family History  Problem Relation Age of Onset  . Hypertension Mother   . Hyperlipidemia Mother   . Depression Mother   . Basal cell carcinoma Mother   . Colon polyps Mother   . Bipolar disorder Sister   . Depression Maternal Grandmother   . Lung cancer Maternal Grandfather   . Breast cancer Neg Hx     Social History   Socioeconomic History  .  Marital status: Single    Spouse name: Not on file  . Number of children: 3  . Years of education: Not on file  . Highest education level: Not on file  Occupational History    Employer: [employer]  Tobacco Use  . Smoking status: Never Smoker  . Smokeless tobacco: Never Used  Substance and Sexual Activity  . Alcohol use: No    Alcohol/week: 0.0 standard drinks  . Drug use: No  . Sexual activity: Not Currently  Other Topics Concern  . Not on file  Social History Narrative  . Not on file   Social Determinants of Health   Financial Resource Strain:   . Difficulty of Paying Living Expenses: Not on file  Food Insecurity:   . Worried About Charity fundraiser in the Last Year: Not on file  . Ran Out of Food in the Last Year: Not on file  Transportation Needs:     . Lack of Transportation (Medical): Not on file  . Lack of Transportation (Non-Medical): Not on file  Physical Activity:   . Days of Exercise per Week: Not on file  . Minutes of Exercise per Session: Not on file  Stress:   . Feeling of Stress : Not on file  Social Connections:   . Frequency of Communication with Friends and Family: Not on file  . Frequency of Social Gatherings with Friends and Family: Not on file  . Attends Religious Services: Not on file  . Active Member of Clubs or Organizations: Not on file  . Attends Archivist Meetings: Not on file  . Marital Status: Not on file  Intimate Partner Violence:   . Fear of Current or Ex-Partner: Not on file  . Emotionally Abused: Not on file  . Physically Abused: Not on file  . Sexually Abused: Not on file    Review of Systems: See HPI, otherwise negative ROS  Physical Exam: BP 117/81   Pulse 84   Temp 97.6 F (36.4 C) (Temporal)   Resp 20   Ht 5\' 5"  (1.651 m)   Wt 88.5 kg   SpO2 100%   BMI 32.45 kg/m  General:   Alert,  pleasant and cooperative in NAD Head:  Normocephalic and atraumatic. Neck:  Supple; no masses or thyromegaly. Lungs:  Clear throughout to auscultation.    Heart:  Regular rate and rhythm. Abdomen:  Soft, nontender and nondistended. Normal bowel sounds, without guarding, and without rebound.   Neurologic:  Alert and  oriented x4;  grossly normal neurologically.  Impression/Plan: [name] is here for an endoscopy and colonoscopy to be performed for chronic GERD, dysphagia, colon cancer screening  Risks, benefits, limitations, and alternatives regarding  endoscopy and colonoscopy have been reviewed with the patient.  Questions have been answered.  All parties agreeable.   Sherri Sear, MD  01/10/2020, 10:37 AM

## 2020-01-11 ENCOUNTER — Encounter: Payer: Self-pay | Admitting: *Deleted

## 2020-01-11 LAB — SURGICAL PATHOLOGY

## 2020-01-12 ENCOUNTER — Telehealth: Payer: Self-pay | Admitting: Gastroenterology

## 2020-01-12 NOTE — Telephone Encounter (Signed)
Pt is calling  Returning a call for results

## 2020-01-12 NOTE — Telephone Encounter (Signed)
Patient is calling about her path results.

## 2020-01-16 NOTE — Telephone Encounter (Signed)
Called her and left message to call back  RV

## 2020-01-25 ENCOUNTER — Ambulatory Visit (INDEPENDENT_AMBULATORY_CARE_PROVIDER_SITE_OTHER): Payer: 59 | Admitting: Psychology

## 2020-01-25 DIAGNOSIS — F331 Major depressive disorder, recurrent, moderate: Secondary | ICD-10-CM

## 2020-02-07 ENCOUNTER — Telehealth: Payer: Self-pay | Admitting: Gastroenterology

## 2020-02-07 NOTE — Telephone Encounter (Signed)
Patient called & states she received a call back for her results and missed it. Please call again

## 2020-02-08 ENCOUNTER — Ambulatory Visit (INDEPENDENT_AMBULATORY_CARE_PROVIDER_SITE_OTHER): Payer: No Typology Code available for payment source | Admitting: Psychology

## 2020-02-08 DIAGNOSIS — F3111 Bipolar disorder, current episode manic without psychotic features, mild: Secondary | ICD-10-CM | POA: Diagnosis not present

## 2020-02-13 ENCOUNTER — Telehealth: Payer: Self-pay | Admitting: Gastroenterology

## 2020-02-13 NOTE — Telephone Encounter (Signed)
Patient called stating she had spoken to DR Marius Ditch about her results. She would like to discuss them further with her since reading in my chart.

## 2020-02-16 NOTE — Telephone Encounter (Signed)
Called patient and discussed pathology results of ampullary area. Reassured her that no further endoscopy is needed at this time. Patient expressed understanding  RV

## 2020-02-22 ENCOUNTER — Ambulatory Visit: Payer: No Typology Code available for payment source | Admitting: Psychology

## 2020-03-04 ENCOUNTER — Telehealth (INDEPENDENT_AMBULATORY_CARE_PROVIDER_SITE_OTHER): Payer: No Typology Code available for payment source | Admitting: Internal Medicine

## 2020-03-04 ENCOUNTER — Encounter: Payer: Self-pay | Admitting: Internal Medicine

## 2020-03-04 ENCOUNTER — Other Ambulatory Visit: Payer: Self-pay

## 2020-03-04 DIAGNOSIS — J329 Chronic sinusitis, unspecified: Secondary | ICD-10-CM

## 2020-03-04 DIAGNOSIS — B9789 Other viral agents as the cause of diseases classified elsewhere: Secondary | ICD-10-CM | POA: Diagnosis not present

## 2020-03-04 MED ORDER — PREDNISONE 10 MG PO TABS: ORAL_TABLET | ORAL | 0 refills | Status: DC

## 2020-03-04 NOTE — Patient Instructions (Signed)

## 2020-03-04 NOTE — Progress Notes (Signed)
Virtual Visit via Video Note  I connected with Lacey Decker on 03/04/20 at 11:15 AM EDT by a video enabled telemedicine application and verified that I am speaking with the correct person using two identifiers.  Location: Patient: [place] Provider: Office   I discussed the limitations of evaluation and management by telemedicine and the availability of in person appointments. The patient expressed understanding and agreed to proceed.  HPI  Pt reports facial pain and pressure, runny nose. This started 1 week ago. She is blowing clear mucous out of her nose. She does have some post nasal drip. She denies headache, itchy, watery eyes, eye redness, nasal congestion, ear pain, sore throat, loss of taste/smell, cough or SOB. She denies fever, chills or body aches. She has tried Tylenol Sinus, Zyrtec and Flonase OTC with minimal relief. She has not had sick contacts that she is aware of.   Review of Systems     Past Medical History:  Diagnosis Date  . Adenomyosis   . Alcohol abuse    last drink one year ago  . Anxiety   . Bipolar 1 disorder ([place])   . Chlamydia    as a teen  . Endometriosis   . GERD (gastroesophageal reflux disease)   . MRSA (methicillin resistant staph aureus) culture positive   . PMDD (premenstrual dysphoric disorder)   . SUI (stress urinary incontinence, female)     Family History  Problem Relation Age of Onset  . Hypertension Mother   . Hyperlipidemia Mother   . Depression Mother   . Basal cell carcinoma Mother   . Colon polyps Mother   . Bipolar disorder Sister   . Depression Maternal Grandmother   . Lung cancer Maternal Grandfather   . Breast cancer Neg Hx     Social History   Socioeconomic History  . Marital status: Single    Spouse name: Not on file  . Number of children: 3  . Years of education: Not on file  . Highest education level: Not on file  Occupational History    Employer: [employer]  Tobacco Use  . Smoking status: Never Smoker  .  Smokeless tobacco: Never Used  Substance and Sexual Activity  . Alcohol use: No    Alcohol/week: 0.0 standard drinks  . Drug use: No  . Sexual activity: Not Currently  Other Topics Concern  . Not on file  Social History Narrative  . Not on file   Social Determinants of Health   Financial Resource Strain:   . Difficulty of Paying Living Expenses:   Food Insecurity:   . Worried About Charity fundraiser in the Last Year:   . Arboriculturist in the Last Year:   Transportation Needs:   . Film/video editor (Medical):   Marland Kitchen Lack of Transportation (Non-Medical):   Physical Activity:   . Days of Exercise per Week:   . Minutes of Exercise per Session:   Stress:   . Feeling of Stress :   Social Connections:   . Frequency of Communication with Friends and Family:   . Frequency of Social Gatherings with Friends and Family:   . Attends Religious Services:   . Active Member of Clubs or Organizations:   . Attends Archivist Meetings:   Marland Kitchen Marital Status:   Intimate Partner Violence:   . Fear of Current or Ex-Partner:   . Emotionally Abused:   Marland Kitchen Physically Abused:   . Sexually Abused:     Allergies  Allergen Reactions  . Codeine     REACTION: anaphylactic shock  . Morphine     REACTION: swelling  . Augmentin [Amoxicillin-Pot Clavulanate]   . Clarithromycin     REACTION: Trouble breathing - stomach upset - bad taste in mouth  . Erythromycin Base Other (See Comments)    Eye ointment - worsened conjunctivitis and swelling  . Sulfamethoxazole-Trimethoprim     REACTION: N \\T \ V, rash     Constitutional:  Denies headache, fatigue, fever or abrupt weight changes.  HEENT:  Positive runny nose. Denies eye redness, ear pain, ringing in the ears, wax buildup, nasal congestion or bloody nose. Respiratory: Denies cough, difficulty breathing or shortness of breath.  Cardiovascular: Denies chest pain, chest tightness, palpitations or swelling in the hands or feet.   No other  specific complaints in a complete review of systems (except as listed in HPI above).  Objective:   General: Appears her stated age, well developed, well nourished in NAD. HEENT: Head: normal shape and size, maxillary sinus tenderness noted;  Nose: no congestion noted; Throat/Mouth: no hoarseness noted Neck:  No adenopathy noted.  Pulmonary/Chest: Normal effort and positive vesicular breath sounds. No respiratory distress. No wheezes, rales or ronchi noted.       Assessment & Plan:   Viral Sinusitis  Can use a Neti Pot which can be purchased from your local drug store. Flonase 2 sprays each nostril for 3 days and then as needed. Continue Zyrtec OTC RX for Pred Taper x 6 days If no improvement by Thursday, consider RX for Doxycycline 100 mg BID x 10 days as she is leaving for [place] on Friday  RTC as needed or if symptoms persist. [name], NP  Follow Up Instructions:    I discussed the assessment and treatment plan with the patient. The patient was provided an opportunity to ask questions and all were answered. The patient agreed with the plan and demonstrated an understanding of the instructions.   The patient was advised to call back or seek an in-person evaluation if the symptoms worsen or if the condition fails to improve as anticipated.     [name], NP

## 2020-03-12 ENCOUNTER — Other Ambulatory Visit: Payer: Self-pay | Admitting: Gastroenterology

## 2020-03-12 NOTE — Telephone Encounter (Signed)
Last office visit 12/20/2019 gErd  Last refill 07/27/2019 1 refills

## 2020-06-28 ENCOUNTER — Encounter: Payer: Self-pay | Admitting: Family Medicine

## 2020-06-28 ENCOUNTER — Ambulatory Visit (INDEPENDENT_AMBULATORY_CARE_PROVIDER_SITE_OTHER): Payer: No Typology Code available for payment source | Admitting: Family Medicine

## 2020-06-28 ENCOUNTER — Other Ambulatory Visit: Payer: Self-pay

## 2020-06-28 VITALS — BP 126/72 | HR 72 | Temp 98.4°F | Ht 65.0 in | Wt 203.8 lb

## 2020-06-28 DIAGNOSIS — R253 Fasciculation: Secondary | ICD-10-CM

## 2020-06-28 DIAGNOSIS — Z7189 Other specified counseling: Secondary | ICD-10-CM

## 2020-06-28 MED ORDER — CYCLOBENZAPRINE HCL 5 MG PO TABS: 2.5000 mg | ORAL_TABLET | Freq: Every day | ORAL | 0 refills | Status: DC

## 2020-06-28 MED ORDER — CYCLOBENZAPRINE HCL 5 MG PO TABS: 5.0000 mg | ORAL_TABLET | Freq: Three times a day (TID) | ORAL | 1 refills | Status: DC | PRN

## 2020-06-28 NOTE — Progress Notes (Signed)
Chief Complaint  Patient presents with  . Neck Pain    jumping twitching , no injury, started about 1 week ago no pain in arms     History of Present Illness: HPI   49 year old female patient with bipolar disorder, GAD, MDD presents for new onset neck twitching.  She denies any injury or fall.  Ongoing x 1 week. In  Left anterior neck. No lightheadedness, no vision changes. Lasts few seconds a t a time then recurs.  She denies headache, no neck pain.   She is under stress. Using arms at work a lot.  She has chronic stress and health anxiety, ie hypochondria tendencies.     This visit occurred during the SARS-CoV-2 public health emergency.  Safety protocols were in place, including screening questions prior to the visit, additional usage of staff PPE, and extensive cleaning of exam room while observing appropriate contact time as indicated for disinfecting solutions.   COVID 19 screen:  No recent travel or known exposure to COVID19 The patient denies respiratory symptoms of COVID 19 at this time. The importance of social distancing was discussed today.     Review of Systems  Constitutional: Negative for chills and fever.  HENT: Negative for congestion and ear pain.   Eyes: Negative for pain and redness.  Respiratory: Negative for cough and shortness of breath.   Cardiovascular: Negative for chest pain, palpitations and leg swelling.  Gastrointestinal: Negative for abdominal pain, blood in stool, constipation, diarrhea, nausea and vomiting.  Genitourinary: Negative for dysuria.  Musculoskeletal: Negative for falls and myalgias.  Skin: Negative for rash.  Neurological: Negative for dizziness.  Psychiatric/Behavioral: Negative for depression. The patient is not nervous/anxious.       Past Medical History:  Diagnosis Date  . Adenomyosis   . Alcohol abuse    last drink one year ago  . Anxiety   . Bipolar 1 disorder ([place])   . Chlamydia    as a teen  . Endometriosis    . GERD (gastroesophageal reflux disease)   . MRSA (methicillin resistant staph aureus) culture positive   . PMDD (premenstrual dysphoric disorder)   . SUI (stress urinary incontinence, female)     reports that she has never smoked. She has never used smokeless tobacco. She reports that she does not drink alcohol and does not use drugs.   Current Outpatient Medications:  .  cetirizine (ZYRTEC) 10 MG tablet, Take 10 mg by mouth daily., Disp: , Rfl:  .  fluticasone (FLONASE) 50 MCG/ACT nasal spray, USE 2 SPRAYS IN EACH  NOSTRIL DAILY, Disp: 48 g, Rfl: 3 .  omeprazole (PRILOSEC) 40 MG capsule, TAKE 1 CAPSULE BY MOUTH  TWICE DAILY BEFORE A MEAL, Disp: 180 capsule, Rfl: 0 .  LORazepam (ATIVAN) 0.5 MG tablet, Take 1 tablet (0.5 mg total) by mouth every 12 (twelve) hours. (Patient not taking: Reported on 03/04/2020), Disp: 60 tablet, Rfl: 0 .  predniSONE (DELTASONE) 10 MG tablet, Take 6 tabs day 1, 5 tabs day 2, 4 tabs day 3, 3 tabs day 4, 2 tabs day 5, 1 tab day 6 (Patient not taking: Reported on 06/28/2020), Disp: 21 tablet, Rfl: 0   Observations/Objective: Blood pressure 126/72, pulse 72, temperature 98.4 F (36.9 C), temperature source Temporal, height 5\' 5"  (1.651 m), weight 203 lb 12.8 oz (92.4 kg), SpO2 97 %.  Physical Exam Constitutional:      General: She is not in acute distress.    Appearance: Normal appearance. She is well-developed.  She is not ill-appearing or toxic-appearing.  HENT:     Head: Normocephalic.     Right Ear: Hearing, tympanic membrane, ear canal and external ear normal. Tympanic membrane is not erythematous, retracted or bulging.     Left Ear: Hearing, tympanic membrane, ear canal and external ear normal. Tympanic membrane is not erythematous, retracted or bulging.     Nose: No mucosal edema or rhinorrhea.     Right Sinus: No maxillary sinus tenderness or frontal sinus tenderness.     Left Sinus: No maxillary sinus tenderness or frontal sinus tenderness.      Mouth/Throat:     Pharynx: Uvula midline.  Eyes:     General: Lids are normal. Lids are everted, no foreign bodies appreciated.     Conjunctiva/sclera: Conjunctivae normal.     Pupils: Pupils are equal, round, and reactive to light.  Neck:     Thyroid: No thyroid mass or thyromegaly.     Vascular: No carotid bruit.     Trachea: Trachea normal.  Cardiovascular:     Rate and Rhythm: Normal rate and regular rhythm.     Pulses: Normal pulses.     Heart sounds: Normal heart sounds, S1 normal and S2 normal. No murmur heard.  No friction rub. No gallop.   Pulmonary:     Effort: Pulmonary effort is normal. No tachypnea or respiratory distress.     Breath sounds: Normal breath sounds. No decreased breath sounds, wheezing, rhonchi or rales.  Abdominal:     General: Bowel sounds are normal.     Palpations: Abdomen is soft.     Tenderness: There is no abdominal tenderness.  Musculoskeletal:     Cervical back: Normal range of motion and neck supple.  Skin:    General: Skin is warm and dry.     Findings: No rash.  Neurological:     Mental Status: She is alert.  Psychiatric:        Mood and Affect: Mood is not anxious or depressed.        Speech: Speech normal.        Behavior: Behavior normal. Behavior is cooperative.        Thought Content: Thought content normal.        Judgment: Judgment normal.      Assessment and Plan   Muscle twitching Normal neck exam. No other symptoms.  Normal carotid, equal.  most likely muscle strain and irritation likely anxiety and stress contributing.   Advice given about COVID-19 virus infection Counseled on risks and benefit of COVID 19 vaccine. 20 min spent in discussion and patient care.     Eliezer Lofts, MD

## 2020-06-28 NOTE — Assessment & Plan Note (Signed)
Counseled on risks and benefit of COVID 19 vaccine. 20 min spent in discussion and patient care.

## 2020-06-28 NOTE — Assessment & Plan Note (Signed)
Normal neck exam. No other symptoms.  Normal carotid, equal.  most likely muscle strain and irritation likely anxiety and stress contributing.

## 2020-06-28 NOTE — Patient Instructions (Signed)
Can try neck stretching and muscle relaxant as discussed.  Work on [employer], stress relief and regular exercise.

## 2020-09-16 ENCOUNTER — Ambulatory Visit (INDEPENDENT_AMBULATORY_CARE_PROVIDER_SITE_OTHER): Payer: No Typology Code available for payment source

## 2020-09-16 ENCOUNTER — Other Ambulatory Visit: Payer: Self-pay

## 2020-09-16 ENCOUNTER — Ambulatory Visit (INDEPENDENT_AMBULATORY_CARE_PROVIDER_SITE_OTHER): Payer: No Typology Code available for payment source | Admitting: Podiatry

## 2020-09-16 ENCOUNTER — Encounter: Payer: Self-pay | Admitting: Podiatry

## 2020-09-16 DIAGNOSIS — M722 Plantar fascial fibromatosis: Secondary | ICD-10-CM | POA: Diagnosis not present

## 2020-09-16 MED ORDER — MELOXICAM 15 MG PO TABS: 15.0000 mg | ORAL_TABLET | Freq: Every day | ORAL | 3 refills | Status: DC

## 2020-09-16 MED ORDER — METHYLPREDNISOLONE 4 MG PO TBPK: ORAL_TABLET | ORAL | 0 refills | Status: DC

## 2020-09-16 NOTE — Progress Notes (Signed)
She presents today chief complaint of pain to the left heel.  States is been bothering her for couple of months mornings particularly bad she still wears her orthotics for plantar fasciitis of the right.  Objective: Vital signs are stable alert and oriented x3.  Pulses are palpable.  She has pain on palpation medial calcaneal tubercle of the left heel.  Radiographs taken today demonstrate soft tissue increase in density plantar fascial cannula insertion site.  Radiographs taken today demonstrate soft tissue increase in density at the plantar fascial insertion site of the left heel with no acute findings are noted.  Assessment: Plantar fasciitis left.  Plan: This point start her on a Medrol dose pack to be followed by meloxicam injected left heel 20 mg Kenalog 5 mg Marcaine and I also got her a plantar fascial brace.  Follow-up with her in 1 month

## 2020-09-16 NOTE — Patient Instructions (Signed)

## 2020-10-14 ENCOUNTER — Telehealth: Payer: Self-pay | Admitting: *Deleted

## 2020-10-14 NOTE — Telephone Encounter (Signed)
Agreed -

## 2020-10-14 NOTE — Telephone Encounter (Signed)
Patient called stating that her daughter tested positive for covid on 10/08/20. Patient stated that she test for covid on 10/11/20 at [facility] and the results were negative. Patient stated that her daughter's boyfriend just tested positive for covid a couple of days ago. Patient stated that covid is in her house. Patient stated that she started with diarrhea about 5 days ago, but denies any other covid symptoms. Patient stated that she may have been tested too soon. Patient was given informations on testing sites. Patient was advised with her symptoms she should test again. Patient stated that she is drinking lots of fluids and urinating without any problems. Patient was advised if her symptoms get worse she should go to the ER to be evaluated and she verbalized understanding. Patient was advised of signs of dehydration. Patient stated that she will get tested again and let Dr. Diona Browner know the results. Patient was advised to quarantine until she gets her results back. Patient has not been vaccinated.

## 2020-10-21 ENCOUNTER — Telehealth: Payer: Self-pay

## 2020-10-21 ENCOUNTER — Encounter: Payer: Self-pay | Admitting: Internal Medicine

## 2020-10-21 ENCOUNTER — Telehealth (INDEPENDENT_AMBULATORY_CARE_PROVIDER_SITE_OTHER): Payer: No Typology Code available for payment source | Admitting: Internal Medicine

## 2020-10-21 DIAGNOSIS — U071 COVID-19: Secondary | ICD-10-CM

## 2020-10-21 MED ORDER — ONDANSETRON HCL 4 MG PO TABS: 4.0000 mg | ORAL_TABLET | Freq: Three times a day (TID) | ORAL | 0 refills | Status: DC | PRN

## 2020-10-21 MED ORDER — IBUPROFEN 600 MG PO TABS: 600.0000 mg | ORAL_TABLET | Freq: Two times a day (BID) | ORAL | 0 refills | Status: DC | PRN

## 2020-10-21 NOTE — Progress Notes (Signed)
Virtual Visit via Video Note  I connected with Lacey Decker on 10/21/20 at 11:15 AM EST by a video enabled telemedicine application and verified that I am speaking with the correct person using two identifiers.  Location: Patient: Home Provider: Office  Person's participating in this call: [name] and [name].  I discussed the limitations of evaluation and management by telemedicine and the availability of in person appointments. The patient expressed understanding and agreed to proceed.  History of Present Illness:  Pt reports sinus pressure, nasal congestion, loss of smell, nausea, lack of appetite and diarrhea. She reports this started 4 days ago. She is not blowing any mucous out of her nose. She denies headache, runny nose, ear pain, loss of taste, sore throat, cough or SOB. She denies vomiting. She reports low grade fever and chills but denies body aches. She reports she was diagnosed with Covid on Friday, exposure from 2 of her children who are also positive.    Past Medical History:  Diagnosis Date  . Adenomyosis   . Alcohol abuse    last drink one year ago  . Anxiety   . Bipolar 1 disorder ([place])   . Chlamydia    as a teen  . Endometriosis   . GERD (gastroesophageal reflux disease)   . MRSA (methicillin resistant staph aureus) culture positive   . PMDD (premenstrual dysphoric disorder)   . SUI (stress urinary incontinence, female)     Current Outpatient Medications  Medication Sig Dispense Refill  . cetirizine (ZYRTEC) 10 MG tablet Take 10 mg by mouth daily.    . cyclobenzaprine (FLEXERIL) 5 MG tablet Take 0.5-1 tablets (2.5-5 mg total) by mouth at bedtime. 15 tablet 0  . fluticasone (FLONASE) 50 MCG/ACT nasal spray USE 2 SPRAYS IN EACH  NOSTRIL DAILY 48 g 3  . meloxicam (MOBIC) 15 MG tablet Take 1 tablet (15 mg total) by mouth daily. 30 tablet 3  . omeprazole (PRILOSEC) 40 MG capsule TAKE 1 CAPSULE BY MOUTH  TWICE DAILY BEFORE A MEAL 180 capsule 0  .  ibuprofen (ADVIL) 600 MG tablet Take 1 tablet (600 mg total) by mouth 2 (two) times daily as needed. 20 tablet 0  . ondansetron (ZOFRAN) 4 MG tablet Take 1 tablet (4 mg total) by mouth every 8 (eight) hours as needed. 30 tablet 0   No current facility-administered medications for this visit.    Allergies  Allergen Reactions  . Codeine     REACTION: anaphylactic shock  . Morphine     REACTION: swelling  . Augmentin [Amoxicillin-Pot Clavulanate]   . Clarithromycin     REACTION: Trouble breathing - stomach upset - bad taste in mouth  . Erythromycin Base Other (See Comments)    Eye ointment - worsened conjunctivitis and swelling  . Sulfamethoxazole-Trimethoprim     REACTION: N \\T \ V, rash    Family History  Problem Relation Age of Onset  . Hypertension Mother   . Hyperlipidemia Mother   . Depression Mother   . Basal cell carcinoma Mother   . Colon polyps Mother   . Bipolar disorder Sister   . Depression Maternal Grandmother   . Lung cancer Maternal Grandfather   . Breast cancer Neg Hx     Social History   Socioeconomic History  . Marital status: Single    Spouse name: Not on file  . Number of children: 3  . Years of education: Not on file  . Highest education level: Not on file  Occupational  History    Employer: [employer]  Tobacco Use  . Smoking status: Never Smoker  . Smokeless tobacco: Never Used  Vaping Use  . Vaping Use: Never used  Substance and Sexual Activity  . Alcohol use: No    Alcohol/week: 0.0 standard drinks  . Drug use: No  . Sexual activity: Not Currently  Other Topics Concern  . Not on file  Social History Narrative  . Not on file   Social Determinants of Health   Financial Resource Strain: Not on file  Food Insecurity: Not on file  Transportation Needs: Not on file  Physical Activity: Not on file  Stress: Not on file  Social Connections: Not on file  Intimate Partner Violence: Not on file     Constitutional: Pt reports malaise, fever,  chills. Denies fever, fatigue, headache or abrupt weight changes.  HEENT: Pt reports facial pressure, nasal congestion and loss of smell. Denies eye pain, eye redness, ear pain, ringing in the ears, wax buildup, runny nose, bloody nose, or sore throat. Respiratory: Denies difficulty breathing, shortness of breath, cough or sputum production.   Cardiovascular: Denies chest pain, chest tightness, palpitations or swelling in the hands or feet.  Gastrointestinal: Pt reports diarrhea. Denies abdominal pain, bloating, constipation, or blood in the stool.  GU: Denies urgency, frequency, pain with urination, burning sensation, blood in urine, odor or discharge.  No other specific complaints in a complete review of systems (except as listed in HPI above).  Observations/Objective:    Wt Readings from Last 3 Encounters:  06/28/20 203 lb 12.8 oz (92.4 kg)  01/10/20 195 lb (88.5 kg)  12/12/19 200 lb 12.8 oz (91.1 kg)    General: Appears her stated age, obese, in NAD. HEENT: Head: normal shape and size, maxillary sinus pressure noted; Nose: congestion noted; Throat/Mouth: hoarseness noted.  Pulmonary/Chest: Normal effort. No respiratory distress.  Neurological: Alert and oriented.  Psychiatric: Anxious and tearful.   BMET    Component Value Date/Time   NA 140 07/28/2019 0932   NA 141 08/22/2013 1719   NA 141 04/21/2012 1642   K 4.3 07/28/2019 0932   K 4.0 04/21/2012 1642   CL 106 07/28/2019 0932   CL 106 04/21/2012 1642   CO2 27 07/28/2019 0932   CO2 27 04/21/2012 1642   GLUCOSE 103 (H) 07/28/2019 0932   GLUCOSE 100 (H) 04/21/2012 1642   BUN 18 07/28/2019 0932   BUN 14 08/22/2013 1719   BUN 12 04/21/2012 1642   CREATININE 0.75 07/28/2019 0932   CREATININE 0.63 04/21/2012 1642   CALCIUM 9.3 07/28/2019 0932   CALCIUM 8.8 04/21/2012 1642   GFRNONAA >60 11/06/2018 1232   GFRNONAA >60 04/21/2012 1642   GFRAA >60 11/06/2018 1232   GFRAA >60 04/21/2012 1642    Lipid Panel      Component Value Date/Time   CHOL 154 07/28/2019 0932   TRIG 69.0 07/28/2019 0932   HDL 44.90 07/28/2019 0932   CHOLHDL 3 07/28/2019 0932   VLDL 13.8 07/28/2019 0932   LDLCALC 95 07/28/2019 0932    CBC    Component Value Date/Time   WBC 7.3 12/20/2019 1325   WBC 8.3 11/06/2018 1232   RBC 3.89 12/20/2019 1325   RBC 3.58 (L) 11/06/2018 1232   HGB 12.9 12/20/2019 1325   HCT 38.1 12/20/2019 1325   PLT 294 12/20/2019 1325   MCV 98 (H) 12/20/2019 1325   MCV 102 (H) 04/21/2012 2012   MCH 33.2 (H) 12/20/2019 1325   MCH 32.1  11/06/2018 1232   MCHC 33.9 12/20/2019 1325   MCHC 31.2 11/06/2018 1232   RDW 12.3 12/20/2019 1325   RDW 13.4 04/21/2012 2012   LYMPHSABS 1.6 11/06/2018 1232   LYMPHSABS 2.2 08/22/2013 1719   MONOABS 0.6 11/06/2018 1232   EOSABS 0.1 11/06/2018 1232   EOSABS 0.2 08/22/2013 1719   BASOSABS 0.0 11/06/2018 1232   BASOSABS 0.0 08/22/2013 1719    Hgb A1C No results found for: HGBA1C     Assessment and Plan:  Covid 19:  Start Flonase BID RX for Ibuprofen 600 mg BID prn with food RX for Zofran 4 mg TID prn No indication for antibiotics, would not qualify for monoclonal antibody infusion ER precautions discussed Discussed the importance of masking, social distancing, self quarantine and frequent handwashing  Return precautions discussed  Follow Up Instructions:    I discussed the assessment and treatment plan with the patient. The patient was provided an opportunity to ask questions and all were answered. The patient agreed with the plan and demonstrated an understanding of the instructions.   The patient was advised to call back or seek an in-person evaluation if the symptoms worsen or if the condition fails to improve as anticipated.    [name], NP

## 2020-10-21 NOTE — Patient Instructions (Signed)
COVID-19 COVID-19 is a respiratory infection that is caused by a virus called severe acute respiratory syndrome coronavirus 2 (SARS-CoV-2). The disease is also known as coronavirus disease or novel coronavirus. In some people, the virus may not cause any symptoms. In others, it may cause a serious infection. The infection can get worse quickly and can lead to complications, such as:  Pneumonia, or infection of the lungs.  Acute respiratory distress syndrome or ARDS. This is a condition in which fluid build-up in the lungs prevents the lungs from filling with air and passing oxygen into the blood.  Acute respiratory failure. This is a condition in which there is not enough oxygen passing from the lungs to the body or when carbon dioxide is not passing from the lungs out of the body.  Sepsis or septic shock. This is a serious bodily reaction to an infection.  Blood clotting problems.  Secondary infections due to bacteria or fungus.  Organ failure. This is when your body's organs stop working. The virus that causes COVID-19 is contagious. This means that it can spread from person to person through droplets from coughs and sneezes (respiratory secretions). What are the causes? This illness is caused by a virus. You may catch the virus by:  Breathing in droplets from an infected person. Droplets can be spread by a person breathing, speaking, singing, coughing, or sneezing.  Touching something, like a table or a doorknob, that was exposed to the virus (contaminated) and then touching your mouth, nose, or eyes. What increases the risk? Risk for infection You are more likely to be infected with this virus if you:  Are within 6 feet (2 meters) of a person with COVID-19.  Provide care for or live with a person who is infected with COVID-19.  Spend time in crowded indoor spaces or live in shared housing. Risk for serious illness You are more likely to become seriously ill from the virus if you:   Are 50 years of age or older. The higher your age, the more you are at risk for serious illness.  Live in a nursing home or long-term care facility.  Have cancer.  Have a long-term (chronic) disease such as: ? Chronic lung disease, including chronic obstructive pulmonary disease or asthma. ? A long-term disease that lowers your body's ability to fight infection (immunocompromised). ? Heart disease, including heart failure, a condition in which the arteries that lead to the heart become narrow or blocked (coronary artery disease), a disease which makes the heart muscle thick, weak, or stiff (cardiomyopathy). ? Diabetes. ? Chronic kidney disease. ? Sickle cell disease, a condition in which red blood cells have an abnormal "sickle" shape. ? Liver disease.  Are obese. What are the signs or symptoms? Symptoms of this condition can range from mild to severe. Symptoms may appear any time from 2 to 14 days after being exposed to the virus. They include:  A fever or chills.  A cough.  Difficulty breathing.  Headaches, body aches, or muscle aches.  Runny or stuffy (congested) nose.  A sore throat.  New loss of taste or smell. Some people may also have stomach problems, such as nausea, vomiting, or diarrhea. Other people may not have any symptoms of COVID-19. How is this diagnosed? This condition may be diagnosed based on:  Your signs and symptoms, especially if: ? You live in an area with a COVID-19 outbreak. ? You recently traveled to or from an area where the virus is common. ? You   provide care for or live with a person who was diagnosed with COVID-19. ? You were exposed to a person who was diagnosed with COVID-19.  A physical exam.  Lab tests, which may include: ? Taking a sample of fluid from the back of your nose and throat (nasopharyngeal fluid), your nose, or your throat using a swab. ? A sample of mucus from your lungs (sputum). ? Blood tests.  Imaging tests, which  may include, X-rays, CT scan, or ultrasound. How is this treated? At present, there is no medicine to treat COVID-19. Medicines that treat other diseases are being used on a trial basis to see if they are effective against COVID-19. Your health care provider will talk with you about ways to treat your symptoms. For most people, the infection is mild and can be managed at home with rest, fluids, and over-the-counter medicines. Treatment for a serious infection usually takes places in a hospital intensive care unit (ICU). It may include one or more of the following treatments. These treatments are given until your symptoms improve.  Receiving fluids and medicines through an IV.  Supplemental oxygen. Extra oxygen is given through a tube in the nose, a face mask, or a hood.  Positioning you to lie on your stomach (prone position). This makes it easier for oxygen to get into the lungs.  Continuous positive airway pressure (CPAP) or bi-level positive airway pressure (BPAP) machine. This treatment uses mild air pressure to keep the airways open. A tube that is connected to a motor delivers oxygen to the body.  Ventilator. This treatment moves air into and out of the lungs by using a tube that is placed in your windpipe.  Tracheostomy. This is a procedure to create a hole in the neck so that a breathing tube can be inserted.  Extracorporeal membrane oxygenation (ECMO). This procedure gives the lungs a chance to recover by taking over the functions of the heart and lungs. It supplies oxygen to the body and removes carbon dioxide. Follow these instructions at home: Lifestyle  If you are sick, stay home except to get medical care. Your health care provider will tell you how long to stay home. Call your health care provider before you go for medical care.  Rest at home as told by your health care provider.  Do not use any products that contain nicotine or tobacco, such as cigarettes, e-cigarettes, and  chewing tobacco. If you need help quitting, ask your health care provider.  Return to your normal activities as told by your health care provider. Ask your health care provider what activities are safe for you. General instructions  Take over-the-counter and prescription medicines only as told by your health care provider.  Drink enough fluid to keep your urine pale yellow.  Keep all follow-up visits as told by your health care provider. This is important. How is this prevented?  There is no vaccine to help prevent COVID-19 infection. However, there are steps you can take to protect yourself and others from this virus. To protect yourself:   Do not travel to areas where COVID-19 is a risk. The areas where COVID-19 is reported change often. To identify high-risk areas and travel restrictions, check the CDC travel website: wwwnc.cdc.gov/travel/notices  If you live in, or must travel to, an area where COVID-19 is a risk, take precautions to avoid infection. ? Stay away from people who are sick. ? Wash your hands often with soap and water for 20 seconds. If soap and water   are not available, use an alcohol-based hand sanitizer. ? Avoid touching your mouth, face, eyes, or nose. ? Avoid going out in public, follow guidance from your state and local health authorities. ? If you must go out in public, wear a cloth face covering or face mask. Make sure your mask covers your nose and mouth. ? Avoid crowded indoor spaces. Stay at least 6 feet (2 meters) away from others. ? Disinfect objects and surfaces that are frequently touched every day. This may include:  Counters and tables.  Doorknobs and light switches.  Sinks and faucets.  Electronics, such as phones, remote controls, keyboards, computers, and tablets. To protect others: If you have symptoms of COVID-19, take steps to prevent the virus from spreading to others.  If you think you have a COVID-19 infection, contact your health care  provider right away. Tell your health care team that you think you may have a COVID-19 infection.  Stay home. Leave your house only to seek medical care. Do not use public transport.  Do not travel while you are sick.  Wash your hands often with soap and water for 20 seconds. If soap and water are not available, use alcohol-based hand sanitizer.  Stay away from other members of your household. Let healthy household members care for children and pets, if possible. If you have to care for children or pets, wash your hands often and wear a mask. If possible, stay in your own room, separate from others. Use a different bathroom.  Make sure that all people in your household wash their hands well and often.  Cough or sneeze into a tissue or your sleeve or elbow. Do not cough or sneeze into your hand or into the air.  Wear a cloth face covering or face mask. Make sure your mask covers your nose and mouth. Where to find more information  Centers for Disease Control and Prevention: www.cdc.gov/coronavirus/2019-ncov/index.html  World Health Organization: www.who.int/health-topics/coronavirus Contact a health care provider if:  You live in or have traveled to an area where COVID-19 is a risk and you have symptoms of the infection.  You have had contact with someone who has COVID-19 and you have symptoms of the infection. Get help right away if:  You have trouble breathing.  You have pain or pressure in your chest.  You have confusion.  You have bluish lips and fingernails.  You have difficulty waking from sleep.  You have symptoms that get worse. These symptoms may represent a serious problem that is an emergency. Do not wait to see if the symptoms will go away. Get medical help right away. Call your local emergency services (911 in the [place] Do not drive yourself to the hospital. Let the emergency medical personnel know if you think you have COVID-19. Summary  COVID-19 is a  respiratory infection that is caused by a virus. It is also known as coronavirus disease or novel coronavirus. It can cause serious infections, such as pneumonia, acute respiratory distress syndrome, acute respiratory failure, or sepsis.  The virus that causes COVID-19 is contagious. This means that it can spread from person to person through droplets from breathing, speaking, singing, coughing, or sneezing.  You are more likely to develop a serious illness if you are 50 years of age or older, have a weak immune system, live in a nursing home, or have chronic disease.  There is no medicine to treat COVID-19. Your health care provider will talk with you about ways to treat your symptoms.    Take steps to protect yourself and others from infection. Wash your hands often and disinfect objects and surfaces that are frequently touched every day. Stay away from people who are sick and wear a mask if you are sick. This information is not intended to replace advice given to you by your health care provider. Make sure you discuss any questions you have with your health care provider. Document Revised: 08/25/2019 Document Reviewed: 12/01/2018 Elsevier Patient Education  2020 [employer] Inc.  

## 2020-10-21 NOTE — Telephone Encounter (Signed)
Per chart review tab pt had appt earlier today with Avie Echevaria NP.

## 2020-10-21 NOTE — Telephone Encounter (Signed)
Lacey Decker - Client TELEPHONE ADVICE RECORD [facility] Patient Name: Lacey Decker Gender: Female DOB: [birth year] Age: 49 Y 10 M 14 D Return Phone Number: [phone] (Primary) [place] [address] Client [facility] - Client Client Site [facility] Physician Eliezer Lofts - MD Contact Type Call Who Is Calling Patient / Member / Family / Caregiver Call Type Triage / Clinical Relationship To Patient Self Return Phone Number [phone] (Primary) Chief Complaint Abdominal Pain Reason for Call Symptomatic / Request for Lacey Decker states she tested positive for Covid. Temp is 99.7. Nausea. Face pain. Nose feels like it's burning. Fatigue. Abdominal pain, diarrhea. Translation No Nurse Assessment Nurse: Mancel Bale, RN, Butch Penny Date/Time Eilene Ghazi Time): 10/21/2020 10:19:47 AM Confirm and document reason for call. If symptomatic, describe symptoms. ---Caller states she tested positive for Covid 19. She is having multiple symptoms including severe sinus pain and lack of appetite and nausea. Temp: 99.8 Does the patient have any new or worsening symptoms? ---Yes Will a triage be completed? ---Yes Related visit to physician within the last 2 weeks? ---No Does the PT have any chronic conditions? (i.e. diabetes, asthma, this includes High risk factors for pregnancy, etc.) ---No Is the patient pregnant or possibly pregnant? (Ask all females between the ages of 84-55) ---No Is this a behavioral health or substance abuse call? ---No Guidelines Guideline Title Affirmed Question Affirmed Notes Nurse Date/Time Eilene Ghazi Time) Sinus Pain or Congestion [1] SEVERE pain AND [2] not improved 2 hours after pain medicine Mancel Bale, RN, Butch Penny 10/21/2020 10:23:04 AM Disp. Time Eilene Ghazi Time) Disposition Final User 10/21/2020 10:32:50 AM See HCP within 4 Hours (or  PCP triage) Yes Mancel Bale, RN, Butch Penny PLEASE NOTE: All timestamps contained within this report are represented as [place] Standard Time. CONFIDENTIALTY NOTICE: This fax transmission is intended only for the addressee. It contains information that is legally privileged, confidential or otherwise protected from use or disclosure. If you are not the intended recipient, you are strictly prohibited from reviewing, disclosing, copying using or disseminating any of this information or taking any action in reliance on or regarding this information. If you have received this fax in error, please notify us immediately by telephone so that we can arrange for its return to [place]. Phone: [phone], Toll-Free: [phone], Fax: [phone] Page: 2 of 2 Call Id: [id number] Lacey Decker Disagree/Comply Comply Caller Understands Yes PreDisposition Call Doctor Care Advice Given Per Guideline * IF OFFICE WILL BE OPEN: You need to be seen within the next 3 or 4 hours. Call your doctor (or NP/PA) now or as soon as the office opens. * For pain relief, you can take either acetaminophen, ibuprofen, or naproxen. CALL BACK IF: * You become worse Comments User: Lacey Decker Date/Time (Eastern Time): 10/21/2020 10:12:13 AM Transferred from the office. User: Lacey Conception, RN Date/Time Eilene Ghazi Time): 10/21/2020 10:25:31 AM Caller described her pain as 6-7/10 but pain is severe because it is so constant. User: Lacey Conception, RN Date/Time Eilene Ghazi Time): 10/21/2020 10:34:01 AM Spoke with Lacey Decker, on the [place], [phone], and the caller was warm transferred to her to make an appt for today. Referrals Warm transfer to backline

## 2020-10-22 ENCOUNTER — Telehealth: Payer: Self-pay | Admitting: Internal Medicine

## 2020-10-22 ENCOUNTER — Telehealth: Payer: No Typology Code available for payment source | Admitting: Family Medicine

## 2020-10-22 NOTE — Telephone Encounter (Signed)
Patient has called in wanting to know if we can put her in for the nuclear antibodies for covid. She is wanting to know if we can write for her to get it. Please advise patient EM

## 2020-10-22 NOTE — Telephone Encounter (Signed)
Based on your age and your chronic health conditions, you would not qualify for the monoclonal antibody infusion.

## 2020-10-23 ENCOUNTER — Telehealth: Payer: Self-pay | Admitting: Family Medicine

## 2020-10-23 MED ORDER — PREDNISONE 10 MG PO TABS: ORAL_TABLET | ORAL | 0 refills | Status: DC

## 2020-10-23 NOTE — Addendum Note (Signed)
Addended by: Jearld Fenton on: 10/23/2020 01:48 PM   Modules accepted: Orders

## 2020-10-23 NOTE — Telephone Encounter (Signed)
Error

## 2020-10-23 NOTE — Telephone Encounter (Signed)
[facility] sent to [address]

## 2020-10-23 NOTE — Telephone Encounter (Signed)
Pt called back and wanted to get the medication Prednisone and to send it to [facility] on [phone]

## 2020-10-23 NOTE — Telephone Encounter (Signed)
[facility] - Client TELEPHONE ADVICE RECORD [facility] Patient Name: Lacey Decker Gender: Female DOB: September 28, 1971 Age: 49 Y 10 M 16 D Return Phone Number: [phone] (Primary) Address[place] [address] Client [facility] - Client Client Site [facility]ician Eliezer Lofts - MD Contact Type Call Who Is Calling Patient / Member / Family / Caregiver Call Type Triage / Clinical Caller Name Raquel Sarna Relationship To Patient Other Return Phone Number [phone] (Primary) Chief Complaint BREATHING - shortness of breath or sounds breathless Reason for Call Symptomatic / Request for [place] states her patient has a severe case of COVID. Prednisone was offered, but she refused. The patient wants the antibody infusion. She said the patient sounds lethargic, and short of breath. She is confused. The patient states she does not have shortness of breath. Translation No Nurse Assessment Nurse: Sumner Boast, RN, Enid Derry Date/Time Eilene Ghazi Time): 10/23/2020 12:14:55 PM Confirm and document reason for call. If symptomatic, describe symptoms. ---Caller states she tested positive for COVID 5 days ago. Taking Zofran and Ibuprofen. No fever. Prednisone was offered, but she refused because it makes her anxious because she has Bipolar. The patient wants the antibody infusion. She says she has a sinus infection. The patient states she does not have shortness of breath. Her face hurts. Mild cough. Does the patient have any new or worsening symptoms? ---Yes Will a triage be completed? ---Yes Related visit to physician within the last 2 weeks? ---No Does the PT have any chronic conditions? (i.e. diabetes, asthma, this includes High risk factors for pregnancy, etc.) ---Yes List chronic conditions. ---Bipolar Pre Diabetic Overweight Is the patient pregnant or possibly  pregnant? (Ask all females between the ages of 56-55) ---No Is this a behavioral health or substance abuse call? ---No Guidelines Guideline Title Affirmed Question Affirmed Notes Nurse Date/Time ([place] Time) COVID-19 - Diagnosed or Suspected [1] COVID-19 diagnosed by positive lab test Bur[place], RNEnid Derry 10/23/2020 12:19:30 PM PLEASE NOTE: All timestamps contained within this report are represented as [place] Standard Time. CONFIDENTIALTY NOTICE: This fax transmission is intended only for the addressee. It contains information that is legally privileged, confidential or otherwise protected from use or disclosure. If you are not the intended recipient, you are strictly prohibited from reviewing, disclosing, copying using or disseminating any of this information or taking any action in reliance on or regarding this information. If you have received this fax in error, please notify us immediately by telephone so that we can arrange for its return to [place]. Phone: [phone], Toll-Free: [phone], Fax: [phone] Page: 2 of 2 Call Id: [id number] Guidelines Guideline Title Affirmed Question Affirmed Notes Nurse Date/Time Eilene Ghazi Time) (e.g., PCR, rapid selftest kit) AND [2] mild symptoms (e.g., cough, fever, others) AND [0] no complications or SOB Disp. Time Eilene Ghazi Time) Disposition Final User 10/23/2020 12:11:35 PM Send to Urgent Queue Izola Price 10/23/2020 12:25:35 PM [place], RN, Teola Bradley Disagree/Comply Comply Caller Understands Yes PreDisposition Call Doctor Care Advice Given Per Guideline HOME CARE: CALL BACK IF: * Chest pain or difficulty breathing occurs * You become worse CARE ADVICE given per COVID-19 - DIAGNOSED OR SUSPECTED (Adult) guideline.

## 2020-10-23 NOTE — Telephone Encounter (Signed)
I spoke with pt; pt said she has already spoken with someone that Avie Echevaria NP advised that she does not qualify for the monoclonal antibody infusion and pts daughter is going to pick up prednisone from pharmacy this afternoon and pt is going to start the prednisone and see how she feels. Pt will cb with update on how she is feeling and UC & ED precautions given and pt voiced understanding. Pt said sometimes prednisone causes her to feel more anxious and if pt needs something for anxiety she will cb and request med for anxiety from Dr Diona Browner. FYI to Avie Echevaria NP who recently sent prednisone in for pt and Dr Diona Browner as PCP.

## 2020-10-23 NOTE — Telephone Encounter (Addendum)
Error

## 2020-10-24 ENCOUNTER — Other Ambulatory Visit: Payer: Self-pay

## 2020-10-24 ENCOUNTER — Encounter (HOSPITAL_COMMUNITY): Payer: Self-pay

## 2020-10-24 ENCOUNTER — Ambulatory Visit (HOSPITAL_COMMUNITY)
Admission: EM | Admit: 2020-10-24 | Discharge: 2020-10-24 | Disposition: A | Discharge status: Home / Self Care | Payer: No Typology Code available for payment source

## 2020-10-24 DIAGNOSIS — U071 COVID-19: Secondary | ICD-10-CM | POA: Diagnosis not present

## 2020-10-24 DIAGNOSIS — M791 Myalgia, unspecified site: Secondary | ICD-10-CM | POA: Diagnosis not present

## 2020-10-24 NOTE — Discharge Instructions (Addendum)
Rest,push fluids, follow up with PCP. If interested call [phone] for monoclonal abx infusion. Quarantine as instructed.

## 2020-10-24 NOTE — ED Provider Notes (Signed)
[place]    CSN: [id number] Arrival date & time: 10/24/20  1211      History   Chief Complaint Chief Complaint  Patient presents with  . Covid + 10/18/2020    HPI Lacey Decker is a 49 y.o. female.   49 year old Caucasian female presents to urgent care with chief complaint of being diagnosed with Covid 10/18/2020, patient reports she is on steroids and has facial redness, also complaining of swishing sound in ears.  Patient states she has no problems breathing and is just worried with diagnosis.  Patient has prescription for Zofran for nausea as well  The history is provided by the patient. No language interpreter was used.    Past Medical History:  Diagnosis Date  . Adenomyosis   . Alcohol abuse    last drink one year ago  . Anxiety   . Bipolar 1 disorder ([place])   . Chlamydia    as a teen  . Endometriosis   . GERD (gastroesophageal reflux disease)   . MRSA (methicillin resistant staph aureus) culture positive   . PMDD (premenstrual dysphoric disorder)   . SUI (stress urinary incontinence, female)     Patient Active Problem List   Diagnosis Date Noted  . COVID-19 10/24/2020  . Myalgia 10/24/2020  . Muscle twitching 06/28/2020  . Advice given about COVID-19 virus infection 06/28/2020  . Gastroesophageal reflux disease with esophagitis without hemorrhage   . Esophageal dysphagia   . Colon cancer screening   . Anxiety and depression 08/22/2019  . Perimenopausal vasomotor symptoms 08/22/2019  . Neck pain on left side 09/02/2018  . Anterior chest wall pain 09/02/2018  . Urinary frequency 05/08/2018  . Urinary incontinence without sensory awareness 02/08/2018  . PMDD (premenstrual dysphoric disorder) 02/08/2018  . SUI (stress urinary incontinence, female) 02/08/2018  . Urinary incontinence 01/27/2018  . Trapezius strain, left, initial encounter 01/18/2018  . Right elbow pain 10/15/2016  . Hot flashes 08/22/2013  . Other malaise and fatigue  08/22/2013  . Neck pain on right side 07/17/2013  . Medial epicondylitis of left elbow 07/17/2013  . Right sided abdominal pain 01/02/2013  . Rash 12/02/2011  . OBESITY, UNSPECIFIED 11/21/2010  . Allergic rhinitis 11/21/2010  . MIGRAINE, COMMON, INTRACTABLE 01/29/2009  . MRSA 11/24/2008  . VERTEBROBASILAR INSUFFICIENCY 07/08/2007  . Bipolar disorder ([place]) 01/26/2007  . Generalized anxiety disorder 01/06/2007  . PANIC ATTACKS 01/06/2007  . Erosive esophagitis 01/06/2007  . DYSFUNCTIONAL UTERINE BLEEDING 01/06/2007  . ROSACEA 01/06/2007    Past Surgical History:  Procedure Laterality Date  . ABDOMINAL HYSTERECTOMY  2009   total lap hyst, LSO due to adenomyosis/endometriosis  . btl  11/09/1998  . COLONOSCOPY WITH PROPOFOL N/A 01/10/2020   Procedure: COLONOSCOPY WITH PROPOFOL;  Surgeon: Lin Landsman, MD;  Location: [facility] ENDOSCOPY;  Service: Gastroenterology;  Laterality: N/A;  . DILATION AND CURETTAGE OF UTERUS  1997   after miscarriage  . ESOPHAGOGASTRODUODENOSCOPY (EGD) WITH PROPOFOL N/A 01/10/2020   Procedure: ESOPHAGOGASTRODUODENOSCOPY (EGD) WITH PROPOFOL;  Surgeon: Lin Landsman, MD;  Location: [facility];  Service: Gastroenterology;  Laterality: N/A;  . LEEP  2000  . misscarriage     x5  . NEUROMA SURGERY     lipoma removal Right labia  . nsvd     3    OB History    Gravida  10   Para  3   Term  3   Preterm      AB  5   Living  3  SAB  5   IAB      Ectopic      Multiple      Live Births               Home Medications    Prior to Admission medications   Medication Sig Start Date End Date Taking? Authorizing Provider  cetirizine (ZYRTEC) 10 MG tablet Take 10 mg by mouth daily.    [provider]  cyclobenzaprine (FLEXERIL) 5 MG tablet Take 0.5-1 tablets (2.5-5 mg total) by mouth at bedtime. 06/28/20   Bedsole, Amy E, MD  fluticasone (FLONASE) 50 MCG/ACT nasal spray USE 2 SPRAYS IN EACH  NOSTRIL DAILY 10/17/19   Bedsole,  Amy E, MD  ibuprofen (ADVIL) 600 MG tablet Take 1 tablet (600 mg total) by mouth 2 (two) times daily as needed. 10/21/20   Jearld Fenton, NP  meloxicam (MOBIC) 15 MG tablet Take 1 tablet (15 mg total) by mouth daily. 09/16/20   Hyatt, Max T, DPM  omeprazole (PRILOSEC) 40 MG capsule TAKE 1 CAPSULE BY MOUTH  TWICE DAILY BEFORE A MEAL 03/12/20   Vanga, Tally Due, MD  ondansetron (ZOFRAN) 4 MG tablet Take 1 tablet (4 mg total) by mouth every 8 (eight) hours as needed. 10/21/20   Jearld Fenton, NP  predniSONE (DELTASONE) 10 MG tablet Take 6 tabs day 1, 5 tabs day 2, 4 tabs day 3, 3 tabs day 4, 2 tabs day 5, 1 tab day 6 10/23/20   Jearld Fenton, NP    Family History Family History  Problem Relation Age of Onset  . Hypertension Mother   . Hyperlipidemia Mother   . Depression Mother   . Basal cell carcinoma Mother   . Colon polyps Mother   . Bipolar disorder Sister   . Depression Maternal Grandmother   . Lung cancer Maternal Grandfather   . Breast cancer Neg Hx     Social History Social History   Tobacco Use  . Smoking status: Never Smoker  . Smokeless tobacco: Never Used  Vaping Use  . Vaping Use: Never used  Substance Use Topics  . Alcohol use: No    Alcohol/week: 0.0 standard drinks  . Drug use: No     Allergies   Codeine, Morphine, Augmentin [amoxicillin-pot clavulanate], Clarithromycin, Erythromycin base, and Sulfamethoxazole-trimethoprim   Review of Systems Review of Systems  Constitutional: Positive for appetite change. Negative for chills and fever.  HENT: Positive for congestion. Negative for ear pain, rhinorrhea and sore throat.   Eyes: Negative.   Respiratory: Negative for cough.   Gastrointestinal: Positive for nausea. Negative for vomiting.  Endocrine: Negative.   Genitourinary: Negative for dysuria.  Musculoskeletal: Negative for myalgias.  Skin: Negative for rash.  Allergic/Immunologic: Negative.   Neurological: Negative for headaches.   Hematological: Negative.   Psychiatric/Behavioral: Negative.   All other systems reviewed and are negative.    Physical Exam Triage Vital Signs ED Triage Vitals  Enc Vitals Group     BP 10/24/20 1256 130/72     Pulse Rate 10/24/20 1256 85     Resp 10/24/20 1256 20     Temp 10/24/20 1256 98.7 F (37.1 C)     Temp Source 10/24/20 1256 Oral     SpO2 10/24/20 1256 100 %     Weight --      Height --      Head Circumference --      Peak Flow --      Pain Score 10/24/20  1301 6     Pain Loc --      Pain Edu? --      Excl. in [place]? --    No data found.  Updated Vital Signs BP 130/72 (BP Location: Right Arm)   Pulse 85   Temp 98.7 F (37.1 C) (Oral)   Resp 20   SpO2 100%   Visual Acuity Right Eye Distance:   Left Eye Distance:   Bilateral Distance:    Right Eye Near:   Left Eye Near:    Bilateral Near:     Physical Exam Vitals and nursing note reviewed.  Constitutional:      General: She is active. She is not in acute distress.    Appearance: She is well-developed and well-nourished.  HENT:     Head: Normocephalic.     Right Ear: Tympanic membrane is retracted.     Left Ear: Tympanic membrane is retracted.     Mouth/Throat:     Mouth: Oropharynx is clear and moist.  Eyes:     General: Lids are normal.     Extraocular Movements: EOM normal.     Conjunctiva/sclera: Conjunctivae normal.     Pupils: Pupils are equal, round, and reactive to light.  Neck:     Trachea: No tracheal deviation.  Cardiovascular:     Rate and Rhythm: Regular rhythm.     Pulses: Normal pulses.     Heart sounds: Normal heart sounds. No murmur heard.   Pulmonary:     Effort: Pulmonary effort is normal.     Breath sounds: Normal breath sounds and air entry.  Abdominal:     General: Bowel sounds are normal.     Palpations: Abdomen is soft.     Tenderness: There is no abdominal tenderness.  Musculoskeletal:        General: Normal range of motion.     Cervical back: Normal range of  motion.  Lymphadenopathy:     Cervical: No cervical adenopathy.  Skin:    General: Skin is warm and dry.     Findings: No rash.  Neurological:     General: No focal deficit present.     Mental Status: She is alert and oriented to person, place, and time.     GCS: GCS eye subscore is 4. GCS verbal subscore is 5. GCS motor subscore is 6.  Psychiatric:        Mood and Affect: Mood and affect normal.        Speech: Speech normal.        Behavior: Behavior normal. Behavior is cooperative.      UC Treatments / Results  Labs (all labs ordered are listed, but only abnormal results are displayed) Labs Reviewed - No data to display  EKG   Radiology No results found.  Procedures Procedures (including critical care time)  Medications Ordered in UC Medications - No data to display  Initial Impression / Assessment and Plan / UC Course  I have reviewed the triage vital signs and the nursing notes.  Pertinent labs & imaging results that were available during my care of the patient were reviewed by me and considered in my medical decision making (see chart for details).     Final Clinical Impressions(s) / UC Diagnoses   Final diagnoses:  COVID-19  Myalgia     Discharge Instructions     Rest,push fluids, follow up with PCP. If interested call [phone] for monoclonal abx infusion. Quarantine as instructed.    ED  Prescriptions    None     PDMP not reviewed this encounter.   Tori Milks, NP 66/06/00 [phone]

## 2020-10-24 NOTE — ED Triage Notes (Signed)
Pt presents covid + as of 10/18/2020, Pt states she came in to be seen because she has a swooshing sound in both ears & back pain that is unrelieved with prescribed pain medication.

## 2020-10-25 ENCOUNTER — Other Ambulatory Visit (HOSPITAL_COMMUNITY): Payer: Self-pay

## 2020-10-25 ENCOUNTER — Telehealth (HOSPITAL_COMMUNITY): Payer: Self-pay

## 2020-10-25 ENCOUNTER — Telehealth (HOSPITAL_COMMUNITY): Payer: Self-pay | Admitting: Family

## 2020-10-25 DIAGNOSIS — U071 COVID-19: Secondary | ICD-10-CM

## 2020-10-25 NOTE — Telephone Encounter (Signed)
Called to discuss with [name] about Covid symptoms and potential candidacy for the use of sotrovimab, a combination monoclonal antibody infusion for those with mild to moderate Covid symptoms and at a high risk of hospitalization.     Pt is qualified for this infusion at the infusion center due to co-morbid conditions and/or a member of an at-risk group, however she states insurance will not cover the infusion and she does not want it. Discussed symptoms to call 911 or seek emergency care.   Denzil Mceachron,NP

## 2020-10-25 NOTE — Telephone Encounter (Signed)
Called to Discuss with patient about Covid symptoms and the use of the monoclonal antibody infusion for those with mild to moderate Covid symptoms and at a high risk of hospitalization.     Pt appears to qualify for this infusion due to co-morbid conditions and/or a member of an at-risk group in accordance with the FDA Emergency Use Authorization.    Pt stated that her symptoms started on 12/10, tested positive on 12/10 with a home test, and tested again on Monday, 12/13, results scanned into the media tab. Pt c/o body aches, headache, decreased appetite. Pt qualifies based on BMI and mental health history. CPT code given to patient so she could call her insurance to find out if treatment is covered. Pt informed an APP will be calling to verify information and if she qualifies will set up an appointment.

## 2020-11-13 ENCOUNTER — Telehealth: Payer: Self-pay

## 2020-11-13 NOTE — Telephone Encounter (Signed)
[facility] Day - Client TELEPHONE ADVICE RECORD Access[employer]urse Patient Name: Lacey Decker Gender: Female DOB: Nov 26, 1970 Age: 50 Y 11 M 7 D Return Phone Number: [phone] (Primary) Address: City/State/Zip: [place] [address] Client [facility] Day - Client Client Site [facility] - Day Physician [name] - MD Contact Type Call Who Is Calling Patient / Member / Family / Caregiver Call Type Triage / Clinical Relationship To Patient Self Return Phone Number [phone] (Primary) Chief Complaint Heart palpitations or irregular heartbeat Reason for Call Symptomatic / Request for Health Information Initial Comment Caller states she is having heart fluttering. She had COVID about a month ago. Translation No Nurse Assessment Nurse: Anner Crete, RN, Olegario Messier Date/Time (Eastern Time): 11/13/2020 1:15:14 PM Confirm and document reason for call. If symptomatic, describe symptoms. ---Caller states she is having heart fluttering. She had COVID about a month ago. Does the patient have any new or worsening symptoms? ---Yes Will a triage be completed? ---Yes Related visit to physician within the last 2 weeks? ---No Does the PT have any chronic conditions? (i.e. diabetes, asthma, this includes High risk factors for pregnancy, etc.) ---Yes List chronic conditions. ---anxiety, panic attacks and bi-polar disorder Is the patient pregnant or possibly pregnant? (Ask all females between the ages of 102-55) ---No Is this a behavioral health or substance abuse call? ---No Guidelines Guideline Title Affirmed Question Affirmed Notes Nurse Date/Time (Eastern Time) Heart Rate and Heartbeat Questions Problems with anxiety or stress Wells, RN, Olegario Messier 11/13/2020 1:16:09 PM Disp. Time Lamount Cohen Time) Disposition Final User 11/13/2020 1:21:49 PM See PCP within 2 Weeks Yes Anner Crete, RN, Sammuel Bailiff Disagree/Comply Comply Caller Understands  Yes PLEASE NOTE: All timestamps contained within this report are represented as [place] Standard Time. CONFIDENTIALTY NOTICE: This fax transmission is intended only for the addressee. It contains information that is legally privileged, confidential or otherwise protected from use or disclosure. If you are not the intended recipient, you are strictly prohibited from reviewing, disclosing, copying using or disseminating any of this information or taking any action in reliance on or regarding this information. If you have received this fax in error, please notify us immediately by telephone so that we can arrange for its return to [place]. Phone: [phone], Toll-Free: [phone], Fax: [phone] Page: 2 of 2 Call Id: [id number] PreDisposition Call Doctor Care Advice Given Per Guideline SEE PCP WITHIN 2 WEEKS: * You need to be seen for this ongoing problem within the next 2 weeks. * People with anxiety or stress may describe a 'rapid heartbeat' or 'pounding' in their chest from their heart beating. HEALTH BASICS: * Sleep: Try to get a sufficient amount of sleep. Lack of sleep can aggravate palpitations. Most people need 7 to 8 hours of sleep each night. CALL BACK IF: * Chest pain, lightheadedness or difficulty breathing occurs * You become worse CARE ADVICE given per Heart Rate and Heartbeat Questions (Adult) guideline. REASSURANCE AND EDUCATION EXTRA HEARTBEATS AND PALPITATIONS: * Diet: Eat a balanced healthy diet. * Liquid Intake: Drink adequate liquids, 6 to 8 glasses of water daily. AVOID CAFFEINE: * Avoid caffeine-containing beverages (Reason: caffeine is a stimulant and can aggravate palpitations). * Examples include coffee, tea, colas, [address] [employer], and some 'energy drinks'. Referrals REFERRED TO PCP OFFICE

## 2020-11-13 NOTE — Telephone Encounter (Signed)
Contacted pt who denies any other symptoms. No chest pain or SOB. Pt does report being positive for covid on 10/22/20. Pt is concerned her "heart flutters" are from having covid and wanted to know if it is from covid. Advised this would have to be discussed with provider. Pt reports she had flutters before covid but that she is having them more frequently now, daily, since she has had covid. Scheduled pt for 11/19/20 which is the apt pt requested but not the soonest available. Advised if she developed any new symptoms to contact the office. Advised of ER precautions. Pt verbalized understanding.

## 2020-11-14 NOTE — Telephone Encounter (Signed)
Noted  

## 2020-11-19 ENCOUNTER — Ambulatory Visit: Payer: No Typology Code available for payment source | Admitting: Family Medicine

## 2020-11-19 ENCOUNTER — Encounter: Payer: Self-pay | Admitting: Family Medicine

## 2020-11-19 ENCOUNTER — Other Ambulatory Visit: Payer: Self-pay

## 2020-11-19 ENCOUNTER — Ambulatory Visit (INDEPENDENT_AMBULATORY_CARE_PROVIDER_SITE_OTHER): Payer: No Typology Code available for payment source | Admitting: Family Medicine

## 2020-11-19 VITALS — BP 128/68 | HR 75 | Temp 96.9°F | Ht 65.0 in | Wt 214.6 lb

## 2020-11-19 DIAGNOSIS — R002 Palpitations: Secondary | ICD-10-CM | POA: Diagnosis not present

## 2020-11-19 DIAGNOSIS — F430 Acute stress reaction: Secondary | ICD-10-CM | POA: Insufficient documentation

## 2020-11-19 LAB — CBC WITH DIFFERENTIAL/PLATELET
Basophils Absolute: 0 10*3/uL (ref 0.0–0.1)
Basophils Relative: 0.7 % (ref 0.0–3.0)
Eosinophils Absolute: 0.2 10*3/uL (ref 0.0–0.7)
Eosinophils Relative: 3.3 % (ref 0.0–5.0)
HCT: 40.1 % (ref 36.0–46.0)
Hemoglobin: 13.4 g/dL (ref 12.0–15.0)
Lymphocytes Relative: 29.8 % (ref 12.0–46.0)
Lymphs Abs: 2.2 10*3/uL (ref 0.7–4.0)
MCHC: 33.3 g/dL (ref 30.0–36.0)
MCV: 97.4 fl (ref 78.0–100.0)
Monocytes Absolute: 0.5 10*3/uL (ref 0.1–1.0)
Monocytes Relative: 6.7 % (ref 3.0–12.0)
Neutro Abs: 4.3 10*3/uL (ref 1.4–7.7)
Neutrophils Relative %: 59.5 % (ref 43.0–77.0)
Platelets: 320 10*3/uL (ref 150.0–400.0)
RBC: 4.12 Mil/uL (ref 3.87–5.11)
RDW: 14.1 % (ref 11.5–15.5)
WBC: 7.3 10*3/uL (ref 4.0–10.5)

## 2020-11-19 LAB — COMPREHENSIVE METABOLIC PANEL
ALT: 20 U/L (ref 0–35)
AST: 16 U/L (ref 0–37)
Albumin: 4.5 g/dL (ref 3.5–5.2)
Alkaline Phosphatase: 57 U/L (ref 39–117)
BUN: 23 mg/dL (ref 6–23)
CO2: 30 mEq/L (ref 19–32)
Calcium: 9.4 mg/dL (ref 8.4–10.5)
Chloride: 103 mEq/L (ref 96–112)
Creatinine, Ser: 0.76 mg/dL (ref 0.40–1.20)
GFR: 90.39 mL/min (ref >60.00)
Glucose, Bld: 109 mg/dL — ABNORMAL HIGH (ref 70–99)
Potassium: 4.1 mEq/L (ref 3.5–5.1)
Sodium: 138 mEq/L (ref 135–145)
Total Bilirubin: 0.4 mg/dL (ref 0.2–1.2)
Total Protein: 6.7 g/dL (ref 6.0–8.3)

## 2020-11-19 LAB — TSH: TSH: 0.88 u[IU]/mL (ref 0.35–4.50)

## 2020-11-19 MED ORDER — LORAZEPAM 0.5 MG PO TABS: 0.5000 mg | ORAL_TABLET | Freq: Two times a day (BID) | ORAL | 0 refills | Status: DC | PRN

## 2020-11-19 NOTE — Patient Instructions (Addendum)
Drink lots of fluids   EKG looks good  Labs today  Also please stop all caffeine   Try ativan -short term as needed for anxiety and see if this helps the palpitations   Increase flonase to twice daily for 5-7 days and see if this helps ears

## 2020-11-19 NOTE — Progress Notes (Signed)
Subjective:    Patient ID: Lacey Decker, female    DOB: 03/24/1971, 50 y.o.   MRN: [id number]  This visit occurred during the SARS-CoV-2 public health emergency.  Safety protocols were in place, including screening questions prior to the visit, additional usage of staff PPE, and extensive cleaning of exam room while observing appropriate contact time as indicated for disinfecting solutions.    HPI 50 yo pt of Dr Diona Browner presents for palpitations   Wt Readings from Last 3 Encounters:  11/19/20 214 lb 9 oz (97.3 kg)  06/28/20 203 lb 12.8 oz (92.4 kg)  01/10/20 195 lb (88.5 kg)   35.71 kg/m   She had covid in mid December  Noted she is having more heart flutters lately   (also more tired also)  Was home for 5 weeks (for family as well)   Last few days- is better than it was  Feels very anxious  Of note-had several rounds of steroids on covid    Face is broken out also (like acne) This is not normal for her   She drinks one soda per day   BP Readings from Last 3 Encounters:  11/19/20 128/68  10/24/20 130/72  06/28/20 126/72   Pulse Readings from Last 3 Encounters:  11/19/20 75  10/24/20 85  06/28/20 72    She has h/o bipolar  Not on medicine but lately much increased anxiety (since covid) Takes occ ativan for severe anxiety   Feels sob with mask on (did not feel this way before covid) - anxious    EKG today  NSR rate of 66   Patient Active Problem List   Diagnosis Date Noted  . Palpitation 11/19/2020  . Stress reaction 11/19/2020  . COVID-19 10/24/2020  . Myalgia 10/24/2020  . Muscle twitching 06/28/2020  . Advice given about COVID-19 virus infection 06/28/2020  . Gastroesophageal reflux disease with esophagitis without hemorrhage   . Esophageal dysphagia   . Colon cancer screening   . Anxiety and depression 08/22/2019  . Perimenopausal vasomotor symptoms 08/22/2019  . Neck pain on left side 09/02/2018  . Anterior chest wall pain 09/02/2018  .  Urinary frequency 05/08/2018  . Urinary incontinence without sensory awareness 02/08/2018  . PMDD (premenstrual dysphoric disorder) 02/08/2018  . SUI (stress urinary incontinence, female) 02/08/2018  . Urinary incontinence 01/27/2018  . Trapezius strain, left, initial encounter 01/18/2018  . Right elbow pain 10/15/2016  . Hot flashes 08/22/2013  . Other malaise and fatigue 08/22/2013  . Neck pain on right side 07/17/2013  . Medial epicondylitis of left elbow 07/17/2013  . Right sided abdominal pain 01/02/2013  . Rash 12/02/2011  . OBESITY, UNSPECIFIED 11/21/2010  . Allergic rhinitis 11/21/2010  . MIGRAINE, COMMON, INTRACTABLE 01/29/2009  . MRSA 11/24/2008  . VERTEBROBASILAR INSUFFICIENCY 07/08/2007  . Bipolar disorder ([place]) 01/26/2007  . Generalized anxiety disorder 01/06/2007  . PANIC ATTACKS 01/06/2007  . Erosive esophagitis 01/06/2007  . DYSFUNCTIONAL UTERINE BLEEDING 01/06/2007  . ROSACEA 01/06/2007   Past Medical History:  Diagnosis Date  . Adenomyosis   . Alcohol abuse    last drink one year ago  . Anxiety   . Bipolar 1 disorder ([place])   . Chlamydia    as a teen  . Endometriosis   . GERD (gastroesophageal reflux disease)   . MRSA (methicillin resistant staph aureus) culture positive   . PMDD (premenstrual dysphoric disorder)   . SUI (stress urinary incontinence, female)    Past Surgical History:  Procedure Laterality Date  .  ABDOMINAL HYSTERECTOMY  2009   total lap hyst, LSO due to adenomyosis/endometriosis  . btl  11/09/1998  . COLONOSCOPY WITH PROPOFOL N/A 01/10/2020   Procedure: COLONOSCOPY WITH PROPOFOL;  Surgeon: Lin Landsman, MD;  Location[facility] ENDOSCOPY;  Service: Gastroenterology;  Laterality: N/A;  . DILATION AND CURETTAGE OF UTERUS  1997   after miscarriage  . ESOPHAGOGASTRODUODENOSCOPY (EGD) WITH PROPOFOL N/A 01/10/2020   Procedure: ESOPHAGOGASTRODUODENOSCOPY (EGD) WITH PROPOFOL;  Surgeon: Lin Landsman, MD;  Location: [facility];   Service: Gastroenterology;  Laterality: N/A;  . LEEP  2000  . misscarriage     x5  . NEUROMA SURGERY     lipoma removal Right labia  . nsvd     3   Social History   Tobacco Use  . Smoking status: Never Smoker  . Smokeless tobacco: Never Used  Vaping Use  . Vaping Use: Never used  Substance Use Topics  . Alcohol use: No    Alcohol/week: 0.0 standard drinks  . Drug use: No   Family History  Problem Relation Age of Onset  . Hypertension Mother   . Hyperlipidemia Mother   . Depression Mother   . Basal cell carcinoma Mother   . Colon polyps Mother   . Bipolar disorder Sister   . Depression Maternal Grandmother   . Lung cancer Maternal Grandfather   . Breast cancer Neg Hx    Allergies  Allergen Reactions  . Codeine     REACTION: anaphylactic shock  . Morphine     REACTION: swelling  . Augmentin [Amoxicillin-Pot Clavulanate]   . Clarithromycin     REACTION: Trouble breathing - stomach upset - bad taste in mouth  . Erythromycin Base Other (See Comments)    Eye ointment - worsened conjunctivitis and swelling  . Sulfamethoxazole-Trimethoprim     REACTION: N \\T \ V, rash   Current Outpatient Medications on File Prior to Visit  Medication Sig Dispense Refill  . cetirizine (ZYRTEC) 10 MG tablet Take 10 mg by mouth daily.    . fluticasone (FLONASE) 50 MCG/ACT nasal spray USE 2 SPRAYS IN EACH  NOSTRIL DAILY 48 g 3  . omeprazole (PRILOSEC) 40 MG capsule TAKE 1 CAPSULE BY MOUTH  TWICE DAILY BEFORE A MEAL 180 capsule 0   No current facility-administered medications on file prior to visit.     Review of Systems  Constitutional: Positive for fatigue. Negative for activity change, appetite change, fever and unexpected weight change.  HENT: Negative for congestion, ear pain, rhinorrhea, sinus pressure and sore throat.   Eyes: Negative for pain, redness and visual disturbance.  Respiratory: Negative for cough, shortness of breath and wheezing.   Cardiovascular: Positive for  palpitations. Negative for chest pain and leg swelling.       Occ chest tightness-no pain or sob   Gastrointestinal: Negative for abdominal pain, blood in stool, constipation and diarrhea.  Endocrine: Negative for polydipsia and polyuria.  Genitourinary: Negative for dysuria, frequency and urgency.  Musculoskeletal: Negative for arthralgias, back pain and myalgias.  Skin: Negative for pallor and rash.       acne  Allergic/Immunologic: Negative for environmental allergies.  Neurological: Negative for dizziness, syncope and headaches.  Hematological: Negative for adenopathy. Does not bruise/bleed easily.  Psychiatric/Behavioral: Negative for decreased concentration and dysphoric mood. The patient is nervous/anxious.        Objective:   Physical Exam Constitutional:      General: She is not in acute distress.    Appearance: Normal appearance. She is  obese. She is not ill-appearing.  Eyes:     General: No scleral icterus.    Conjunctiva/sclera: Conjunctivae normal.     Pupils: Pupils are equal, round, and reactive to light.  Neck:     Vascular: No carotid bruit.  Cardiovascular:     Rate and Rhythm: Normal rate.     Heart sounds: Normal heart sounds. No murmur heard.   Pulmonary:     Effort: Pulmonary effort is normal. No respiratory distress.     Breath sounds: No wheezing, rhonchi or rales.  Chest:     Chest wall: No tenderness.  Abdominal:     General: Abdomen is flat. There is no distension.     Palpations: There is no mass.     Tenderness: There is no abdominal tenderness.  Musculoskeletal:     Cervical back: Normal range of motion and neck supple. No tenderness.     Right lower leg: No edema.     Left lower leg: No edema.     Comments: No LE tenderness  Lymphadenopathy:     Cervical: No cervical adenopathy.  Skin:    Coloration: Skin is not pale.     Findings: No erythema or rash.     Comments: Break out on face (primarily cheeks) - papules/comedones resembling  acne  Neurological:     Mental Status: She is alert.     Cranial Nerves: No cranial nerve deficit.     Motor: No weakness.     Comments: No notable tremor   Psychiatric:        Mood and Affect: Mood is anxious. Affect is tearful.        Speech: Speech normal.     Comments: Very anxious  Sometimes tearful             Assessment & Plan:   Problem List Items Addressed This Visit      Other   Palpitation - Primary    Feels fluttering in chest -more since having covid Some tightness (also with anxiety) No particular triggers Has 1 caff bev per day  EKG-nl today (unchanged)  Reassuring exam and vitals  Suspect anxiety may be trigger   Labs drawn  Asked pt to stop caffeine entirely  Px ativan for short term to see if this helps  Will update  Lab Orders     CBC with Differential/Platelet     Comprehensive metabolic panel     TSH       Relevant Orders   EKG 12-Lead (Completed)   CBC with Differential/Platelet   Comprehensive metabolic panel   TSH   Stress reaction    In the setting of known bipolar dz (not treated) pt has experienced a big inc in anxiety since having covid  Also was on 2 rounds of steroids (per pt) which may have inc anxiety  She is afraid to take the vaccine  Per pt - intol to many psych meds  Can take ativan   In light of severity of symptoms - agreeable to short ativan supply to see if this helps with palpitations along with anxiety  Will update       Relevant Medications   LORazepam (ATIVAN) 0.5 MG tablet

## 2020-11-19 NOTE — Assessment & Plan Note (Addendum)
In the setting of known bipolar dz (not treated) pt has experienced a big inc in anxiety since having covid  Also was on 2 rounds of steroids (per pt) which may have inc anxiety  She is afraid to take the vaccine  Per pt - intol to many psych meds  Can take ativan   In light of severity of symptoms - agreeable to short ativan supply to see if this helps with palpitations along with anxiety  Will update

## 2020-11-19 NOTE — Assessment & Plan Note (Signed)
Feels fluttering in chest -more since having covid Some tightness (also with anxiety) No particular triggers Has 1 caff bev per day  EKG-nl today (unchanged)  Reassuring exam and vitals  Suspect anxiety may be trigger   Labs drawn  Asked pt to stop caffeine entirely  Px ativan for short term to see if this helps  Will update  Lab Orders     CBC with Differential/Platelet     Comprehensive metabolic panel     TSH

## 2020-11-22 ENCOUNTER — Ambulatory Visit: Payer: No Typology Code available for payment source | Admitting: Family Medicine

## 2020-12-17 ENCOUNTER — Ambulatory Visit (INDEPENDENT_AMBULATORY_CARE_PROVIDER_SITE_OTHER): Payer: No Typology Code available for payment source | Admitting: Psychology

## 2020-12-17 DIAGNOSIS — F3111 Bipolar disorder, current episode manic without psychotic features, mild: Secondary | ICD-10-CM

## 2021-01-21 ENCOUNTER — Telehealth: Payer: Self-pay

## 2021-01-21 DIAGNOSIS — S93692A Other sprain of left foot, initial encounter: Secondary | ICD-10-CM

## 2021-01-21 NOTE — Telephone Encounter (Signed)
Pt called and stated that she has completed the steroids that were prescribed and that the PF in her left foot is now so bad that she can barely walk. Pt called and requested that the previously discussed MRI be ordered. Please advise.

## 2021-01-21 NOTE — Telephone Encounter (Signed)
MRI order placed and facility will contact patient for scheduling. Lacey Decker will contact patient to see when would be the best time for patient to be fitted for a boot to reduce pain

## 2021-01-24 ENCOUNTER — Other Ambulatory Visit: Payer: Self-pay | Admitting: Podiatry

## 2021-01-24 DIAGNOSIS — S93692A Other sprain of left foot, initial encounter: Secondary | ICD-10-CM

## 2021-02-01 ENCOUNTER — Other Ambulatory Visit: Payer: Self-pay

## 2021-02-01 ENCOUNTER — Ambulatory Visit
Admission: RE | Admit: 2021-02-01 | Discharge: 2021-02-01 | Disposition: A | Discharge status: Home / Self Care | Payer: No Typology Code available for payment source | Source: Ambulatory Visit | Attending: Podiatry | Admitting: Podiatry

## 2021-02-01 DIAGNOSIS — S93692A Other sprain of left foot, initial encounter: Secondary | ICD-10-CM

## 2021-02-04 ENCOUNTER — Telehealth: Payer: Self-pay | Admitting: Podiatry

## 2021-02-04 ENCOUNTER — Other Ambulatory Visit: Payer: Self-pay

## 2021-02-04 ENCOUNTER — Ambulatory Visit: Payer: No Typology Code available for payment source | Admitting: Podiatry

## 2021-02-04 ENCOUNTER — Ambulatory Visit (INDEPENDENT_AMBULATORY_CARE_PROVIDER_SITE_OTHER): Payer: No Typology Code available for payment source | Admitting: Podiatry

## 2021-02-04 ENCOUNTER — Telehealth: Payer: Self-pay | Admitting: Urology

## 2021-02-04 ENCOUNTER — Encounter: Payer: Self-pay | Admitting: Podiatry

## 2021-02-04 DIAGNOSIS — S93692D Other sprain of left foot, subsequent encounter: Secondary | ICD-10-CM

## 2021-02-04 NOTE — Telephone Encounter (Signed)
Patient calling, with her insurance on the line, stating that insurance needs CPT codes for patients surgery (and specifically what type of surgery is being done. Please advise.

## 2021-02-04 NOTE — Progress Notes (Signed)
She presents today to discuss the findings of her MRI.  She states that her left foot is getting much worse.  She would like to go ahead and have it fixed.  Objective: Vital signs are stable she alert oriented x3.  Pulses are palpable.  There is no erythema edema cellulitis drainage or odor she has severe pain on palpation medial calcaneal tubercle and central calcaneal tubercle of the left heel.  MRI of the left foot and ankle demonstrates a significant tear of the central band of the plantar fascia of the left foot.  A small ganglion cyst which is nontender.  Assessment: Plantar fascial tear of the left heel.  Plan: Discussed etiology pathology conservative surgical therapies at this point time an endoscopic plantar fasciotomy total will be performed.  So we consented her today for this.  She will follow up with me in the near future for surgical intervention.

## 2021-02-04 NOTE — Telephone Encounter (Signed)
I think you can take care of this one.

## 2021-02-04 NOTE — Telephone Encounter (Addendum)
DOS - 02/07/21   EPF LEFT --- [phone]   SPOKE WITH [place] S WITH [employer] INSURANCE AND HE STATED THAT NO PRIOR AUTH IS NEEDED FOR CPT CODE 24825 HE DID STAT THAT PT NEEDS TO CALL INSURANCE TO ADD THE SERVICE TO HER PLAN. I CALLED PT AND INFORMED HER AND SHE STATED SHE WOULD CALL HER INSURANCE.   OIB#[id number]   SPOKE WITH MICHELLE B AND JEFF WITH [employer] AND THEY STATED THAT THE PT DID PURCHASE THE ADDITION TO HER INSURANCE THAT WAS NEEDED AND BOTH STATED THAT SHE IS COVERED AND GOOD TO GO FOR HER SX TOMORROW (02/07/21)  REF # MICHELLE B [id number] 10:03AM EST REF#[id number]  (JEFF)

## 2021-02-05 ENCOUNTER — Other Ambulatory Visit: Payer: Self-pay | Admitting: Podiatry

## 2021-02-05 MED ORDER — MEPERIDINE HCL 50 MG PO TABS: 50.0000 mg | ORAL_TABLET | Freq: Four times a day (QID) | ORAL | 0 refills | Status: AC | PRN

## 2021-02-05 MED ORDER — ONDANSETRON HCL 4 MG PO TABS: 4.0000 mg | ORAL_TABLET | Freq: Three times a day (TID) | ORAL | 0 refills | Status: DC | PRN

## 2021-02-05 MED ORDER — DOXYCYCLINE HYCLATE 100 MG PO TABS: 100.0000 mg | ORAL_TABLET | Freq: Two times a day (BID) | ORAL | 0 refills | Status: DC

## 2021-02-07 DIAGNOSIS — M722 Plantar fascial fibromatosis: Secondary | ICD-10-CM

## 2021-02-08 ENCOUNTER — Other Ambulatory Visit: Payer: Self-pay | Admitting: Podiatry

## 2021-02-08 MED ORDER — HYDROMORPHONE HCL 4 MG PO TABS: 4.0000 mg | ORAL_TABLET | ORAL | 0 refills | Status: DC | PRN

## 2021-02-08 NOTE — Progress Notes (Signed)
PRN postop.   - Patient experienced trip and fall injury day of surgery. Please take f/u ankle xray left to ensure no fracture.   - Rx Dilaudid 4mg  PO Q4H. Pharmacy did not have Demerol in stock.   Edrick Kins, DPM [facility]  Dr. Edrick Kins, DPM    [address], [place] [address]                Office [phone]  Fax [phone]

## 2021-02-12 ENCOUNTER — Ambulatory Visit (INDEPENDENT_AMBULATORY_CARE_PROVIDER_SITE_OTHER): Payer: No Typology Code available for payment source | Admitting: Podiatry

## 2021-02-12 ENCOUNTER — Encounter: Payer: Self-pay | Admitting: Podiatry

## 2021-02-12 ENCOUNTER — Ambulatory Visit (INDEPENDENT_AMBULATORY_CARE_PROVIDER_SITE_OTHER): Payer: No Typology Code available for payment source

## 2021-02-12 DIAGNOSIS — Z9889 Other specified postprocedural states: Secondary | ICD-10-CM

## 2021-02-12 DIAGNOSIS — S9002XA Contusion of left ankle, initial encounter: Secondary | ICD-10-CM

## 2021-02-12 DIAGNOSIS — S93692D Other sprain of left foot, subsequent encounter: Secondary | ICD-10-CM

## 2021-02-12 NOTE — Progress Notes (Signed)
She presents today for her first postop visit date of surgery was 02/07/2021 EPF left.  Patient is in a lot of pain she fell after getting home from the surgery center she states that that was extremely painful after the block wore off.  She had to crawl to the house and use a wheelchair from her boyfriend who was recently had an above-knee amputation.  States that that she was not able to get her Demerol because they have discontinued Demerol she called and was dispensed Dilaudid but was afraid to take it because the pharmacy recommended that she have a reversal agent.  States that she has been vomiting daily because of the antibiotic.  She denies calf pain back pain chest pain shortness of breath.  States that the ankle is painful and the bottom of the foot burns.  Objective: Vital signs are stable alert oriented x3 presents today in her cam walker utilizing crutches.  Cam walker is intact and clean on the bottom the dressing from the surgery center is intact and once removed demonstrates no erythema just some mild edema about the calf and leg no cellulitis drainage or odor.  Sutures are intact margins well coapted no tenderness on palpation of the heel.  Radiographs taken today concerning the M ankle do not demonstrate any fractures that I am able to see.  Assessment endoscopic fasciotomy is healing left.  Contusion left ankle secondary to fall.  Plan: Redressed the foot with a light dressing today and allow her to start getting this wet in the shower however she will have to continue to wear the cam walker 24/7 otherwise.  Follow-up with her in 1 week for suture removal.

## 2021-02-14 DIAGNOSIS — M79676 Pain in unspecified toe(s): Secondary | ICD-10-CM

## 2021-02-19 ENCOUNTER — Ambulatory Visit (INDEPENDENT_AMBULATORY_CARE_PROVIDER_SITE_OTHER): Payer: No Typology Code available for payment source | Admitting: Podiatry

## 2021-02-19 ENCOUNTER — Other Ambulatory Visit: Payer: Self-pay

## 2021-02-19 DIAGNOSIS — S93692D Other sprain of left foot, subsequent encounter: Secondary | ICD-10-CM

## 2021-02-19 DIAGNOSIS — Z9889 Other specified postprocedural states: Secondary | ICD-10-CM

## 2021-02-19 NOTE — Progress Notes (Signed)
She presents today for her second postop visit she is about 12 days postop endoscopic plantar fasciotomy of her left foot.  She denies fever chills nausea muscle aches and pain states that her ankle is sore from falling immediately after returning home from surgery.  X-rays were done and nothing was seen at that time.  Objective: Dressings intact was removed demonstrates sutures are intact today much decrease in edema some ecchymosis to the mid arch is present mild tenderness on palpation of the heel.  Mild tenderness on palpation of the medial and the lateral ankle left.  Assessment: Endoscopic plantar fasciotomy at x12 days is healing well.  Ankle contusion or sprain.  Plan: Sutures were removed today placed in a compression anklet instructed her to continue to ambulate either in a tennis shoe or in her cam walker but needs to sleep with the cam walker or a night splint at bedtime.  We are going to follow-up with her in 2 weeks at which time a x-ray of the ankle will be done.

## 2021-02-20 DIAGNOSIS — M79676 Pain in unspecified toe(s): Secondary | ICD-10-CM

## 2021-03-05 ENCOUNTER — Ambulatory Visit (INDEPENDENT_AMBULATORY_CARE_PROVIDER_SITE_OTHER): Payer: No Typology Code available for payment source | Admitting: Podiatry

## 2021-03-05 ENCOUNTER — Encounter: Payer: Self-pay | Admitting: Podiatry

## 2021-03-05 ENCOUNTER — Other Ambulatory Visit: Payer: Self-pay

## 2021-03-05 DIAGNOSIS — S93692D Other sprain of left foot, subsequent encounter: Secondary | ICD-10-CM

## 2021-03-05 DIAGNOSIS — Z9889 Other specified postprocedural states: Secondary | ICD-10-CM

## 2021-03-05 MED ORDER — METHYLPREDNISOLONE 4 MG PO TBPK
ORAL_TABLET | ORAL | 0 refills | Status: DC
Start: 2021-03-05 — End: 2021-04-22

## 2021-03-05 NOTE — Progress Notes (Signed)
She presents today date of surgery 02/07/2021 EPF left states that the heel is fine my ankle and lower leg are killing me.  Objective: Vital signs are stable alert oriented x3 presents today in her tennis shoes.  She has some reproducible pain on palpation medial calcaneal tubercle of her left foot.  She has some tenderness on the medial aspect of her ankle just above her ankle joint and laterally as well.  Radiographs taken today do not demonstrate any type of ankle abnormalities.  Looks more like an ankle contusion.  No fractures are identified.  She has no calf pain and there is no warmth no erythema cellulitis drainage or odor.  Assessment contusion ankle from her fall postoperatively.  Also slowly healing endoscopic fasciotomy.  Plan: Encouraged her to continue to wear her regular shoes and boot at nighttime.  Follow-up with her in a couple weeks

## 2021-03-19 ENCOUNTER — Encounter: Payer: No Typology Code available for payment source | Admitting: Podiatry

## 2021-03-26 ENCOUNTER — Ambulatory Visit (INDEPENDENT_AMBULATORY_CARE_PROVIDER_SITE_OTHER): Payer: No Typology Code available for payment source | Admitting: Podiatry

## 2021-03-26 ENCOUNTER — Other Ambulatory Visit: Payer: Self-pay

## 2021-03-26 DIAGNOSIS — Z9889 Other specified postprocedural states: Secondary | ICD-10-CM

## 2021-03-26 NOTE — Progress Notes (Signed)
She presents today states that she is back to work endoscopic plantar fasciotomy 02/07/2021 was doing pretty well states that she still has some tenderness in the leg as she points to the medial aspect of the leg just superior to the medial malleolus.  But states that is doing much better than it was previously.  Has no pain in the heel whatsoever.  Objective: Vital signs are stable alert oriented x3 no reproducible pain other than that of the leg which does not radiate.  Assessment: Resolving endoscopic plantar fasciotomy.  Plan: #1 follow-up with her on an as-needed basis.

## 2021-03-28 ENCOUNTER — Other Ambulatory Visit: Payer: Self-pay | Admitting: Family Medicine

## 2021-03-28 ENCOUNTER — Telehealth: Payer: Self-pay

## 2021-03-28 DIAGNOSIS — R7309 Other abnormal glucose: Secondary | ICD-10-CM

## 2021-03-28 DIAGNOSIS — Z13220 Encounter for screening for lipoid disorders: Secondary | ICD-10-CM

## 2021-03-28 NOTE — Telephone Encounter (Signed)
That was fasting 12 hours per pt report

## 2021-03-28 NOTE — Telephone Encounter (Signed)
A1c ordered.

## 2021-03-28 NOTE — Telephone Encounter (Signed)
Was the 140 fasting or non fasting.. if non-fasting it is normal. We can check A1C with labs

## 2021-03-28 NOTE — Telephone Encounter (Signed)
Pt stated that with her biometrics her BG 140 and she would like to have A1C checked as they told her that she is likely pre diabetic.... pt has CPE scheduled and just wanted to [facility] in case you wanted to add to CPE labs

## 2021-04-17 ENCOUNTER — Other Ambulatory Visit: Payer: Self-pay

## 2021-04-17 ENCOUNTER — Other Ambulatory Visit (INDEPENDENT_AMBULATORY_CARE_PROVIDER_SITE_OTHER): Payer: No Typology Code available for payment source

## 2021-04-17 DIAGNOSIS — Z13220 Encounter for screening for lipoid disorders: Secondary | ICD-10-CM | POA: Diagnosis not present

## 2021-04-17 DIAGNOSIS — R7309 Other abnormal glucose: Secondary | ICD-10-CM

## 2021-04-17 LAB — COMPREHENSIVE METABOLIC PANEL
ALT: 17 U/L (ref 0–35)
AST: 19 U/L (ref 0–37)
Albumin: 4.4 g/dL (ref 3.5–5.2)
Alkaline Phosphatase: 52 U/L (ref 39–117)
BUN: 25 mg/dL — ABNORMAL HIGH (ref 6–23)
CO2: 29 mEq/L (ref 19–32)
Calcium: 9.5 mg/dL (ref 8.4–10.5)
Chloride: 106 mEq/L (ref 96–112)
Creatinine, Ser: 0.8 mg/dL (ref 0.40–1.20)
GFR: 84.75 mL/min (ref >60.00)
Glucose, Bld: 108 mg/dL — ABNORMAL HIGH (ref 70–99)
Potassium: 4.8 mEq/L (ref 3.5–5.1)
Sodium: 141 mEq/L (ref 135–145)
Total Bilirubin: 0.4 mg/dL (ref 0.2–1.2)
Total Protein: 7.2 g/dL (ref 6.0–8.3)

## 2021-04-17 LAB — LIPID PANEL
Cholesterol: 144 mg/dL (ref 0–200)
HDL: 47.6 mg/dL (ref >39.00)
LDL Cholesterol: 84 mg/dL (ref 0–99)
NonHDL: 96.52
Total CHOL/HDL Ratio: 3
Triglycerides: 64 mg/dL (ref 0.0–149.0)
VLDL: 12.8 mg/dL (ref 0.0–40.0)

## 2021-04-17 LAB — HEMOGLOBIN A1C: Hgb A1c MFr Bld: 6 % (ref 4.6–6.5)

## 2021-04-17 NOTE — Progress Notes (Signed)
No critical labs need to be addressed urgently. We will discuss labs in detail at upcoming office visit.   

## 2021-04-22 ENCOUNTER — Encounter: Payer: Self-pay | Admitting: Family Medicine

## 2021-04-22 ENCOUNTER — Other Ambulatory Visit: Payer: Self-pay

## 2021-04-22 ENCOUNTER — Ambulatory Visit (INDEPENDENT_AMBULATORY_CARE_PROVIDER_SITE_OTHER): Payer: No Typology Code available for payment source | Admitting: Family Medicine

## 2021-04-22 VITALS — BP 122/80 | HR 60 | Temp 98.3°F | Ht 64.75 in | Wt 212.8 lb

## 2021-04-22 DIAGNOSIS — F310 Bipolar disorder, current episode hypomanic: Secondary | ICD-10-CM

## 2021-04-22 DIAGNOSIS — F419 Anxiety disorder, unspecified: Secondary | ICD-10-CM

## 2021-04-22 DIAGNOSIS — Z23 Encounter for immunization: Secondary | ICD-10-CM | POA: Diagnosis not present

## 2021-04-22 DIAGNOSIS — Z6835 Body mass index (BMI) 35.0-35.9, adult: Secondary | ICD-10-CM

## 2021-04-22 DIAGNOSIS — F32A Depression, unspecified: Secondary | ICD-10-CM

## 2021-04-22 DIAGNOSIS — Z0000 Encounter for general adult medical examination without abnormal findings: Secondary | ICD-10-CM

## 2021-04-22 DIAGNOSIS — R7303 Prediabetes: Secondary | ICD-10-CM | POA: Diagnosis not present

## 2021-04-22 MED ORDER — LORAZEPAM 0.5 MG PO TABS: 0.5000 mg | ORAL_TABLET | Freq: Two times a day (BID) | ORAL | 0 refills | Status: DC | PRN

## 2021-04-22 NOTE — Assessment & Plan Note (Signed)
Not currently treated, refuses referral to psychiatry and medication to treat.

## 2021-04-22 NOTE — Progress Notes (Signed)
Patient ID: Lacey Decker, female    DOB: 03-06-1971, 50 y.o.   MRN: [id number]  This visit was conducted in person.  BP 122/80   Pulse 60   Temp 98.3 F (36.8 C) (Temporal)   Ht 5' 4.75" (1.645 m)   Wt 212 lb 12 oz (96.5 kg)   SpO2 98%   BMI 35.68 kg/m    CC: Chief Complaint  Patient presents with   Annual Exam    Subjective:   HPI: Lacey Decker is a 50 y.o. female presenting on 04/22/2021 for Annual Exam  Reviewed labs in detail with pt.  Lab Results  Component Value Date   CHOL 144 04/17/2021   HDL 47.60 04/17/2021   LDLCALC 84 04/17/2021   TRIG 64.0 04/17/2021   CHOLHDL 3 04/17/2021  The 10-year ASCVD risk score Mikey Bussing DC Jr., et al., 2013) is: 1.1%   Values used to calculate the score:     Age: 10 years     Sex: Female     Is Non-Hispanic African American: No     Diabetic: No     Tobacco smoker: No     Systolic Blood Pressure: 884 mmHg     Is BP treated: No     HDL Cholesterol: 47.6 mg/dL     Total Cholesterol: 144 mg/dL  She is very worried about sugar level given  boyfriend with recent amputation due to DM. She has extreme appetite.Marland Kitchen gets irritable if eating less.  She has had issues doing weight watchers.  Wt Readings from Last 3 Encounters:  04/22/21 212 lb 12 oz (96.5 kg)  11/19/20 214 lb 9 oz (97.3 kg)  06/28/20 203 lb 12.8 oz (92.4 kg)  Body mass index is 35.68 kg/m.    Bipolar disorder, Anxiety, MDD:    She is not currently treated for bipolar do.. she does not wish to see psych at this point ad refuses any meds.  She request ativan to use prn panic attacks.Marland Kitchen occ rarely but more frequently sine boyfriend ill.   Dr. Milinda Pointer has been treating rupture of plantar fascia.Marland Kitchen last OV note reviewed 03/26/2021  Gradually improving  Relevant past medical, surgical, family and social history reviewed and updated as indicated. Interim medical history since our last visit reviewed. Allergies and medications reviewed and updated. Outpatient Medications  Prior to Visit  Medication Sig Dispense Refill   cetirizine (ZYRTEC) 10 MG tablet Take 10 mg by mouth daily.     fluticasone (FLONASE) 50 MCG/ACT nasal spray USE 2 SPRAYS IN EACH  NOSTRIL DAILY 48 g 3   LORazepam (ATIVAN) 0.5 MG tablet Take 1 tablet (0.5 mg total) by mouth 2 (two) times daily as needed for anxiety. 10 tablet 0   omeprazole (PRILOSEC) 40 MG capsule TAKE 1 CAPSULE BY MOUTH  TWICE DAILY BEFORE A MEAL 180 capsule 0   HYDROmorphone (DILAUDID) 4 MG tablet Take 1 tablet (4 mg total) by mouth every 4 (four) hours as needed for severe pain. 30 tablet 0   methylPREDNISolone (MEDROL DOSEPAK) 4 MG TBPK tablet 6 day dose pack - take as directed 21 tablet 0   ondansetron (ZOFRAN) 4 MG tablet Take 1 tablet (4 mg total) by mouth every 8 (eight) hours as needed. 20 tablet 0   No facility-administered medications prior to visit.     Per HPI unless specifically indicated in ROS section below Review of Systems Objective:  BP 122/80   Pulse 60   Temp 98.3 F (36.8 C) (  Temporal)   Ht 5' 4.75" (1.645 m)   Wt 212 lb 12 oz (96.5 kg)   SpO2 98%   BMI 35.68 kg/m   Wt Readings from Last 3 Encounters:  04/22/21 212 lb 12 oz (96.5 kg)  11/19/20 214 lb 9 oz (97.3 kg)  06/28/20 203 lb 12.8 oz (92.4 kg)      Physical Exam    Results for orders placed or performed in visit on 04/17/21  Comprehensive metabolic panel  Result Value Ref Range   Sodium 141 135 - 145 mEq/L   Potassium 4.8 3.5 - 5.1 mEq/L   Chloride 106 96 - 112 mEq/L   CO2 29 19 - 32 mEq/L   Glucose, Bld 108 (H) 70 - 99 mg/dL   BUN 25 (H) 6 - 23 mg/dL   Creatinine, Ser 0.80 0.40 - 1.20 mg/dL   Total Bilirubin 0.4 0.2 - 1.2 mg/dL   Alkaline Phosphatase 52 39 - 117 U/L   AST 19 0 - 37 U/L   ALT 17 0 - 35 U/L   Total Protein 7.2 6.0 - 8.3 g/dL   Albumin 4.4 3.5 - 5.2 g/dL   GFR 84.75 >60.00 mL/min   Calcium 9.5 8.4 - 10.5 mg/dL  Lipid panel  Result Value Ref Range   Cholesterol 144 0 - 200 mg/dL   Triglycerides 64.0 0.0 -  149.0 mg/dL   HDL 47.60 >39.00 mg/dL   VLDL 12.8 0.0 - 40.0 mg/dL   LDL Cholesterol 84 0 - 99 mg/dL   Total CHOL/HDL Ratio 3    NonHDL 96.52   Hemoglobin A1c  Result Value Ref Range   Hgb A1c MFr Bld 6.0 4.6 - 6.5 %    This visit occurred during the SARS-CoV-2 public health emergency.  Safety protocols were in place, including screening questions prior to the visit, additional usage of staff PPE, and extensive cleaning of exam room while observing appropriate contact time as indicated for disinfecting solutions.   COVID 19 screen:  No recent travel or known exposure to COVID19 The patient denies respiratory symptoms of COVID 19 at this time. The importance of social distancing was discussed today.   Assessment and Plan The patient's preventative maintenance and recommended screening tests for an annual wellness exam were reviewed in full today. Brought up to date unless services declined.  Counselled on the importance of diet, exercise, and its role in overall health and mortality. The patient's FH and SH was reviewed, including their home life, tobacco status, and drug and alcohol status.   Vaccines: Due for COVID, tdap and shingrix Pap/DVE:     Per GYN 08/2019 Mammo:   Per GYN, 06/2019 Bone Density:not indicated Colon: 01/2020  Smoking Status: none ETOH/ drug use: none/none  HIV screen:   completed in past   Problem List Items Addressed This Visit     Anxiety and depression    Usually well controlled.. worsened anxiety and panic lately given boyfriends illness. Use ativan prn.  PDMP reviewed.. no red flags.       Relevant Medications   LORazepam (ATIVAN) 0.5 MG tablet   Bipolar disorder (HCC)    Not currently treated, refuses referral to psychiatry and medication to treat.       BMI 35.0-35.9,adult    Referral to nutritionist and she will look into Trulicity.       Relevant Orders   Amb ref to Medical Nutrition Therapy-MNT   Prediabetes   Relevant Orders   Amb  ref to Medical  Nutrition Therapy-MNT   Other Visit Diagnoses     Routine general medical examination at a health care facility    -  Primary        Eliezer Lofts, MD

## 2021-04-22 NOTE — Addendum Note (Signed)
Addended by: Carter Kitten on: 04/22/2021 12:02 PM   Modules accepted: Orders

## 2021-04-22 NOTE — Assessment & Plan Note (Signed)
Usually well controlled.. worsened anxiety and panic lately given boyfriends illness. Use ativan prn.  PDMP reviewed.. no red flags.

## 2021-04-22 NOTE — Patient Instructions (Addendum)
Call insurance to consider Trulicity.  Work on water exercise.   We will work on diabetes referral.    Please call the location of your choice from the menu below to schedule your Mammogram and/or Bone Density appointment.    [facility]                      Phone:  [phone], [place] [address]                                                             Services: Traditional and 3D Mammogram, [facility]                 Phone: [phone] [address], [place] [address]    Service: Bone Density ONLY   *this site does NOT perform mammograms  [facility]                        Phone:  [phone] [address], [place] [address]                                            Services:  3D Mammogram and [place] at [facility]   Phone:  [phone]   [place], [place] [address]                                            Services: 3D Mammogram and [place]  [place] at [facility])  Phone:  [phone]   [address]  [place], [place] [address]                                              Services:  3D Mammogram and Bone Density

## 2021-04-22 NOTE — Assessment & Plan Note (Signed)
Referral to nutritionist and she will look into Trulicity.

## 2021-04-24 ENCOUNTER — Other Ambulatory Visit: Payer: Self-pay | Admitting: Family Medicine

## 2021-04-24 DIAGNOSIS — Z1231 Encounter for screening mammogram for malignant neoplasm of breast: Secondary | ICD-10-CM

## 2021-04-24 NOTE — Telephone Encounter (Signed)
PA forms are in Dr. Rometta Emery in box to complete.  I am not sure how to complete forms since medication has not actually been prescribed.

## 2021-04-25 ENCOUNTER — Other Ambulatory Visit: Payer: Self-pay | Admitting: Family Medicine

## 2021-04-25 DIAGNOSIS — Z6835 Body mass index (BMI) 35.0-35.9, adult: Secondary | ICD-10-CM

## 2021-04-25 DIAGNOSIS — R7303 Prediabetes: Secondary | ICD-10-CM

## 2021-04-25 MED ORDER — TRULICITY 0.75 MG/0.5ML ~~LOC~~ SOAJ: 0.7500 mg | SUBCUTANEOUS | 11 refills | Status: DC

## 2021-04-25 NOTE — Progress Notes (Signed)
k

## 2021-04-25 NOTE — Telephone Encounter (Signed)
Lacey Decker called in and stated that she is out of her medication and wanted to check on it.

## 2021-04-25 NOTE — Telephone Encounter (Signed)
Completed PA faxed to [facility] at [phone].

## 2021-04-29 MED ORDER — TRULICITY 0.75 MG/0.5ML ~~LOC~~ SOAJ: 0.7500 mg | SUBCUTANEOUS | 3 refills | Status: DC

## 2021-04-29 NOTE — Telephone Encounter (Addendum)
Mrs. Assefa called in due to [facility] told her that they dont have the active prescription and the correct dosage and they stated that the PA was not completed and wanted to know about what is going on.  And she would like a phone call.    Please advise

## 2021-04-29 NOTE — Telephone Encounter (Signed)
Left message for Lacey Decker that we submitted PA for Trulicity on Friday.  Still waiting to hear back for [facility].

## 2021-04-29 NOTE — Addendum Note (Signed)
Addended by: Carter Kitten on: 04/29/2021 05:00 PM   Modules accepted: Orders

## 2021-04-30 NOTE — Telephone Encounter (Addendum)
Anderson Malta with PA dept of [employer] left v/m requesting cb about missing info on PA. Use PA # [id number].

## 2021-04-30 NOTE — Telephone Encounter (Signed)
Called [facility] and got a recording that [id number] has been denied.

## 2021-05-01 ENCOUNTER — Ambulatory Visit
Admission: RE | Admit: 2021-05-01 | Discharge: 2021-05-01 | Disposition: A | Discharge status: Home / Self Care | Payer: No Typology Code available for payment source | Source: Ambulatory Visit | Attending: Family Medicine | Admitting: Family Medicine

## 2021-05-01 ENCOUNTER — Other Ambulatory Visit: Payer: Self-pay

## 2021-05-01 DIAGNOSIS — Z1231 Encounter for screening mammogram for malignant neoplasm of breast: Secondary | ICD-10-CM | POA: Diagnosis not present

## 2021-05-02 ENCOUNTER — Other Ambulatory Visit: Payer: Self-pay | Admitting: Family Medicine

## 2021-05-02 ENCOUNTER — Telehealth: Payer: Self-pay

## 2021-05-02 DIAGNOSIS — R928 Other abnormal and inconclusive findings on diagnostic imaging of breast: Secondary | ICD-10-CM

## 2021-05-02 NOTE — Telephone Encounter (Signed)
I cannot find orders in the computer.  Can you help me?

## 2021-05-02 NOTE — Telephone Encounter (Signed)
Patient called stating [facility] waiting for co sign on patient's orders for additional imaging for her breast. Patient is anxious about this, she is aware Dr Diona Browner is not in the office but I advised patient I will send it to another provider to see if they can sign.  Please let patient know when this is done and she will call [facility] back to set up the additional appointment. Thank you

## 2021-05-02 NOTE — Telephone Encounter (Signed)
Amanee notified by telephone that Dr. Lorelei Pont signed the orders so she can call [facility] and get scheduled.

## 2021-05-08 ENCOUNTER — Ambulatory Visit
Admission: RE | Admit: 2021-05-08 | Discharge: 2021-05-08 | Disposition: A | Discharge status: Home / Self Care | Payer: No Typology Code available for payment source | Source: Ambulatory Visit | Attending: Family Medicine | Admitting: Family Medicine

## 2021-05-08 ENCOUNTER — Other Ambulatory Visit: Payer: Self-pay

## 2021-05-08 ENCOUNTER — Other Ambulatory Visit: Payer: Self-pay | Admitting: Family Medicine

## 2021-05-08 DIAGNOSIS — R928 Other abnormal and inconclusive findings on diagnostic imaging of breast: Secondary | ICD-10-CM

## 2021-05-08 DIAGNOSIS — N6310 Unspecified lump in the right breast, unspecified quadrant: Secondary | ICD-10-CM

## 2021-05-08 DIAGNOSIS — N6001 Solitary cyst of right breast: Secondary | ICD-10-CM

## 2021-05-13 ENCOUNTER — Ambulatory Visit
Admission: RE | Admit: 2021-05-13 | Discharge: 2021-05-13 | Disposition: A | Discharge status: Home / Self Care | Payer: No Typology Code available for payment source | Source: Ambulatory Visit | Attending: Family Medicine | Admitting: Family Medicine

## 2021-05-13 ENCOUNTER — Other Ambulatory Visit: Payer: Self-pay

## 2021-05-13 DIAGNOSIS — N6001 Solitary cyst of right breast: Secondary | ICD-10-CM | POA: Insufficient documentation

## 2021-05-13 DIAGNOSIS — N6310 Unspecified lump in the right breast, unspecified quadrant: Secondary | ICD-10-CM | POA: Insufficient documentation

## 2021-05-13 DIAGNOSIS — R928 Other abnormal and inconclusive findings on diagnostic imaging of breast: Secondary | ICD-10-CM

## 2021-05-14 NOTE — Progress Notes (Signed)
PCP:  [name], MD   Chief Complaint  Patient presents with   Gynecologic Exam    Abnormal odor, no discharge/itchiness x 1-2 weeks     HPI:      [name] is a 50 y.o. [id number] who LMP was No LMP recorded. Patient has had a hysterectomy., presents today for her annual examination.  Her menses are absent due to [place] LSO due to endometriosis/adenomyosis in 2009. No pelvic pain. She does not have PMB. Having terrible night sweats still. Discussed effexor vs prog last yr, but pt wasn't interested. Also under lots of stress with work.  Sex activity: not sex active. Last Pap: 08/22/19  Results were: no abnormalities /neg HPV DNA  Hx of STDs: HPV on pap/LEEP  Hx of BV in the past, last treated 6/20. Having issues with vag odor, no increased d/c or irritation for a couple wks. Uses dryer sheets, ivory soap but was using Bath and body works soap.  Doesn't feel like BV in past.   Last mammogram: 05/08/21 Results were: Cat 3 RT breast; small cyst aspirated and resolved on imaging; has repeat RT axillary u/s in 3 months due to LAN, most likely due to recent vaccine booster There is no FH of breast cancer. There is no FH of ovarian cancer. The patient does not do self-breast exams due to fibrocystic breasts and always feels something.   Tobacco use: The patient denies current or previous tobacco use. Alcohol use: none No drug use.  Exercise: not active  Colonoscopy: 3/21 with polyp with Dr. Marius Ditch; repeat due after ? Yrs (pt unsure and I can't find in epic).  She does not get adequate calcium and Vitamin D in her diet.  Labs with PCP.   Past Medical History:  Diagnosis Date   Adenomyosis    Alcohol abuse    last drink one year ago   Anxiety    Bipolar 1 disorder (HCC)    Chlamydia    as a teen   Endometriosis    GERD (gastroesophageal reflux disease)    MRSA (methicillin resistant staph aureus) culture positive    PMDD (premenstrual dysphoric disorder)    SUI  (stress urinary incontinence, female)     Past Surgical History:  Procedure Laterality Date   ABDOMINAL HYSTERECTOMY  2009   total lap hyst, LSO due to adenomyosis/endometriosis   btl  11/09/1998   COLONOSCOPY WITH PROPOFOL N/A 01/10/2020   Procedure: COLONOSCOPY WITH PROPOFOL;  Surgeon: Lin Landsman, MD;  Location: [facility];  Service: Gastroenterology;  Laterality: N/A;   DILATION AND CURETTAGE OF UTERUS  1997   after miscarriage   ESOPHAGOGASTRODUODENOSCOPY (EGD) WITH PROPOFOL N/A 01/10/2020   Procedure: ESOPHAGOGASTRODUODENOSCOPY (EGD) WITH PROPOFOL;  Surgeon: Lin Landsman, MD;  Location: [facility];  Service: Gastroenterology;  Laterality: N/A;   LEEP  2000   misscarriage     x5   NEUROMA SURGERY     lipoma removal Right labia   nsvd     3    Family History  Problem Relation Age of Onset   Hypertension Mother    Hyperlipidemia Mother    Depression Mother    Basal cell carcinoma Mother    Colon polyps Mother    Bipolar disorder Sister    Depression Maternal Grandmother    Lung cancer Maternal Grandfather    Breast cancer Neg Hx     Social History   Socioeconomic History   Marital status: Single  Spouse name: Not on file   Number of children: 3   Years of education: Not on file   Highest education level: Not on file  Occupational History    Employer: [employer]  Tobacco Use   Smoking status: Never   Smokeless tobacco: Never  Vaping Use   Vaping Use: Never used  Substance and Sexual Activity   Alcohol use: No    Alcohol/week: 0.0 standard drinks   Drug use: No   Sexual activity: Not Currently    Birth control/protection: Surgical    Comment: Hysterectomy  Other Topics Concern   Not on file  Social History Narrative   Not on file   Social Determinants of Health   Financial Resource Strain: Not on file  Food Insecurity: Not on file  Transportation Needs: Not on file  Physical Activity: Not on file  Stress: Not on file  Social  Connections: Not on file  Intimate Partner Violence: Not on file    Outpatient Medications Prior to Visit  Medication Sig Dispense Refill   cetirizine (ZYRTEC) 10 MG tablet Take 10 mg by mouth daily.     fluticasone (FLONASE) 50 MCG/ACT nasal spray USE 2 SPRAYS IN EACH  NOSTRIL DAILY 48 g 3   LORazepam (ATIVAN) 0.5 MG tablet Take 1 tablet (0.5 mg total) by mouth 2 (two) times daily as needed for anxiety. 10 tablet 0   omeprazole (PRILOSEC) 40 MG capsule TAKE 1 CAPSULE BY MOUTH  TWICE DAILY BEFORE A MEAL 180 capsule 0   Dulaglutide (TRULICITY) 7.32 KG/2.5KY SOPN Inject 0.75 mg into the skin once a week. (Patient not taking: Reported on 05/15/2021) 9 mL 3   No facility-administered medications prior to visit.      ROS:  Review of Systems  Constitutional:  Negative for fatigue, fever and unexpected weight change.  Respiratory:  Negative for cough, shortness of breath and wheezing.   Cardiovascular:  Negative for chest pain, palpitations and leg swelling.  Gastrointestinal:  Negative for blood in stool, constipation, diarrhea, nausea and vomiting.  Endocrine: Negative for cold intolerance, heat intolerance and polyuria.  Genitourinary:  Negative for dyspareunia, dysuria, flank pain, frequency, genital sores, hematuria, menstrual problem, pelvic pain, urgency, vaginal bleeding, vaginal discharge and vaginal pain.  Musculoskeletal:  Negative for back pain, joint swelling and myalgias.  Skin:  Negative for rash.  Neurological:  Negative for dizziness, syncope, light-headedness, numbness and headaches.  Hematological:  Negative for adenopathy.  Psychiatric/Behavioral:  Positive for agitation and dysphoric mood. Negative for confusion, sleep disturbance and suicidal ideas. The patient is not nervous/anxious.   BREAST: No symptoms   Objective: BP 110/60   Ht 5\' 5"  (1.651 m)   Wt 212 lb (96.2 kg)   BMI 35.28 kg/m    Physical Exam Constitutional:      Appearance: She is well-developed.   Genitourinary:     Vulva normal.     Genitourinary Comments: UTERUS/CX SURG REM     Right Labia: No rash, tenderness or lesions.    Left Labia: No tenderness, lesions or rash.    Vaginal cuff intact.    No vaginal discharge, erythema or tenderness.      Right Adnexa: not tender and no mass present.    Left Adnexa: not tender and no mass present.    Cervix is absent.     Uterus is absent.  Breasts:    Right: No mass, nipple discharge, skin change or tenderness.     Left: No mass, nipple discharge, skin  change or tenderness.  Neck:     Thyroid: No thyromegaly.  Cardiovascular:     Rate and Rhythm: Normal rate and regular rhythm.     Heart sounds: Normal heart sounds. No murmur heard. Pulmonary:     Effort: Pulmonary effort is normal.     Breath sounds: Normal breath sounds.  Abdominal:     Palpations: Abdomen is soft.     Tenderness: There is no abdominal tenderness. There is no guarding.  Musculoskeletal:        General: Normal range of motion.     Cervical back: Normal range of motion.  Neurological:     General: No focal deficit present.     Mental Status: She is alert and oriented to person, place, and time.     Cranial Nerves: No cranial nerve deficit.  Skin:    General: Skin is warm and dry.  Psychiatric:        Mood and Affect: Mood normal.        Behavior: Behavior normal.        Thought Content: Thought content normal.        Judgment: Judgment normal.  Vitals reviewed.   Results for orders placed or performed in visit on 05/15/21 (from the past 24 hour(s))  POCT Wet Prep with KOH     Status: Normal   Collection Time: 05/15/21  2:32 PM  Result Value Ref Range   Trichomonas, UA Negative    Clue Cells Wet Prep HPF POC neg    Epithelial Wet Prep HPF POC     Yeast Wet Prep HPF POC neg    Bacteria Wet Prep HPF POC     RBC Wet Prep HPF POC     WBC Wet Prep HPF POC     KOH Prep POC Negative Negative    Assessment/Plan: Encounter for annual routine  gynecological examination  Encounter for screening mammogram for malignant neoplasm of breast--pt current with mammo. Has 3 mo f/u 9/22.   Vaginal odor - Plan: POCT Wet Prep with KOH; neg wet prep, no other sx. Line dry underwear, change to dove sens skin soap. F/u if sx persist or become like BV and will treat with flagyl.   Perimenopausal vasomotor symptoms--try estroven, increase exercise. Stress can exacerbate sx too. F/u prn.             GYN counsel breast self exam, mammography screening, menopause, adequate intake of calcium and vitamin D, diet and exercise     F/U  Return in about 1 year (around 05/15/2022).  Tiawana Forgy B. Lorain Fettes, PA-C 05/15/2021 2:33 PM

## 2021-05-15 ENCOUNTER — Encounter: Payer: Self-pay | Admitting: Obstetrics and Gynecology

## 2021-05-15 ENCOUNTER — Other Ambulatory Visit: Payer: Self-pay

## 2021-05-15 ENCOUNTER — Ambulatory Visit (INDEPENDENT_AMBULATORY_CARE_PROVIDER_SITE_OTHER): Payer: No Typology Code available for payment source | Admitting: Obstetrics and Gynecology

## 2021-05-15 VITALS — BP 110/60 | Ht 65.0 in | Wt 212.0 lb

## 2021-05-15 DIAGNOSIS — Z01419 Encounter for gynecological examination (general) (routine) without abnormal findings: Secondary | ICD-10-CM | POA: Diagnosis not present

## 2021-05-15 DIAGNOSIS — Z1231 Encounter for screening mammogram for malignant neoplasm of breast: Secondary | ICD-10-CM

## 2021-05-15 DIAGNOSIS — N898 Other specified noninflammatory disorders of vagina: Secondary | ICD-10-CM | POA: Diagnosis not present

## 2021-05-15 DIAGNOSIS — N951 Menopausal and female climacteric states: Secondary | ICD-10-CM

## 2021-05-15 LAB — POCT WET PREP WITH KOH
Clue Cells Wet Prep HPF POC: NEGATIVE
KOH Prep POC: NEGATIVE
Trichomonas, UA: NEGATIVE
Yeast Wet Prep HPF POC: NEGATIVE

## 2021-05-15 NOTE — Patient Instructions (Signed)
I value your feedback and you entrusting us with your care. If you get a [facility] patient survey, I would appreciate you taking the time to let us know about your experience today. Thank you! ? ? ?

## 2021-07-02 ENCOUNTER — Other Ambulatory Visit: Payer: Self-pay

## 2021-07-02 ENCOUNTER — Encounter: Payer: Self-pay | Admitting: Family Medicine

## 2021-07-02 ENCOUNTER — Ambulatory Visit (INDEPENDENT_AMBULATORY_CARE_PROVIDER_SITE_OTHER): Payer: No Typology Code available for payment source | Admitting: Family Medicine

## 2021-07-02 ENCOUNTER — Ambulatory Visit: Payer: No Typology Code available for payment source | Admitting: Dietician

## 2021-07-02 VITALS — BP 110/70 | HR 61 | Temp 98.3°F | Ht 64.75 in | Wt 218.2 lb

## 2021-07-02 DIAGNOSIS — G5702 Lesion of sciatic nerve, left lower limb: Secondary | ICD-10-CM | POA: Diagnosis not present

## 2021-07-02 DIAGNOSIS — M5416 Radiculopathy, lumbar region: Secondary | ICD-10-CM

## 2021-07-02 DIAGNOSIS — M7062 Trochanteric bursitis, left hip: Secondary | ICD-10-CM

## 2021-07-02 MED ORDER — PREDNISONE 20 MG PO TABS: ORAL_TABLET | ORAL | 0 refills | Status: DC

## 2021-07-02 NOTE — Progress Notes (Signed)
Shelley Cocke T. Wilian Kwong, MD, [place] at [facility] [place], [address]  Phone: [phone]  FAX: [place] - 50 y.o. female  MRN [id number]  Date of Birth: 04-24-1971  Date: 07/02/2021  PCP: [name], MD  Referral: [name], MD  Chief Complaint  Patient presents with   Back Pain    ?Sciatica    This visit occurred during the SARS-CoV-2 public health emergency.  Safety protocols were in place, including screening questions prior to the visit, additional usage of staff PPE, and extensive cleaning of exam room while observing appropriate contact time as indicated for disinfecting solutions.   Subjective:   Lacey Decker is a 50 y.o. very pleasant female patient who presents with the following: Back Pain  ongoing for approximately: 1 mo The patient has had back pain before. The back pain is localized into the lumbar spine area. They also describe radiculopathy.  About a month ago, starting in the L buttocks and going down her leg.  To the mid part of the tibia  Tried to do some yoga, but it did not help.   L GTB - also has some lateral hip pain  No numbness or tingling. No bowel or bladder incontinence. No focal weakness. Prior interventions: none Physical therapy: No Chiropractic manipulations: No Acupuncture: No Osteopathic manipulation: No Heat or cold: Minimal effect   Review of Systems is noted in the HPI, as appropriate  Objective:   Blood pressure 110/70, pulse 61, temperature 98.3 F (36.8 C), temperature source Temporal, height 5' 4.75" (1.645 m), weight 218 lb 4 oz (99 kg), SpO2 100 %.  GEN: No acute distress; alert,appropriate. PULM: Breathing comfortably in no respiratory distress PSYCH: Normally interactive.   Range of motion at  the waist: Flexion, rotation and lateral bending: Forward flexion to 85 degrees with full extension and lateral rotational  movements.  No echymosis or edema Rises to examination table with no difficulty Gait: minimally antalgic  Inspection/Deformity: No abnormality Paraspinus T: Mildly on the left, and the predominant area of pain is at the middle of the left buttocks in the region of the piriformis.  B Ankle Dorsiflexion (L5,4): 5/5 B Great Toe Dorsiflexion (L5,4): 5/5 Heel Walk (L5): WNL Toe Walk (S1): WNL Rise/Squat (L4): WNL, mild pain  SENSORY B Medial Foot (L4): WNL B Dorsum (L5): WNL B Lateral (S1): WNL Light Touch: WNL Pinprick: WNL  REFLEXES Knee (L4): 2+ Ankle (S1): 2+  B SLR, seated: neg B SLR, supine: neg B FABER: neg B Reverse FABER: neg B Greater Troch: TTP at left B Log Roll: neg B Stork: NT B Sciatic Notch: NT  Radiology: No results found.  Assessment and Plan:     ICD-10-CM   1. Piriformis syndrome of left side  G57.02     2. Lumbar radiculopathy, acute  M54.16     3. Trochanteric bursitis of left hip  M70.62      Back pain with exacerbation and new onset of radicular symptoms.  Using Netter's Orthopaedic Anatomy, reviewed with the patient the structures involved and how they related to diagnosis. The patient indicated understanding.   The patient was given a handout about classic piriformis stretching including [employer], Modified [employer], my self-described "Sink Stretch," and other piriformis rehab.  We also reviewed hip flexor and abductor strengthening, ham stretching  Rec deep massage, explained self-massage with ball   If her symptoms persist, we  can do more of a work-up for lumbar origin.  Follow-up: No follow-ups on file.  Meds ordered this encounter  Medications   predniSONE (DELTASONE) 20 MG tablet    Sig: 2 tabs po daily for 5 days, then 1 tab po daily for 5 days    Dispense:  15 tablet    Refill:  0   Medications Discontinued During This Encounter  Medication Reason   Dulaglutide (TRULICITY) A999333 0000000 SOPN Not covered by the  pt's insurance   cetirizine (ZYRTEC) 10 MG tablet Patient Preference   No orders of the defined types were placed in this encounter.   Signed,  Maud Deed. Trayshawn Durkin, MD   Outpatient Encounter Medications as of 07/02/2021  Medication Sig   fluticasone (FLONASE) 50 MCG/ACT nasal spray USE 2 SPRAYS IN EACH  NOSTRIL DAILY   LORazepam (ATIVAN) 0.5 MG tablet Take 1 tablet (0.5 mg total) by mouth 2 (two) times daily as needed for anxiety.   omeprazole (PRILOSEC) 40 MG capsule TAKE 1 CAPSULE BY MOUTH  TWICE DAILY BEFORE A MEAL   predniSONE (DELTASONE) 20 MG tablet 2 tabs po daily for 5 days, then 1 tab po daily for 5 days   [DISCONTINUED] cetirizine (ZYRTEC) 10 MG tablet Take 10 mg by mouth daily.   [DISCONTINUED] Dulaglutide (TRULICITY) [id number] 0000000 SOPN Inject 0.75 mg into the skin once a week. (Patient not taking: Reported on 05/15/2021)   No facility-administered encounter medications on file as of 07/02/2021.

## 2021-07-07 ENCOUNTER — Encounter: Payer: Self-pay | Admitting: Podiatry

## 2021-07-07 ENCOUNTER — Other Ambulatory Visit: Payer: Self-pay

## 2021-07-07 ENCOUNTER — Ambulatory Visit (INDEPENDENT_AMBULATORY_CARE_PROVIDER_SITE_OTHER): Payer: No Typology Code available for payment source | Admitting: Podiatry

## 2021-07-07 DIAGNOSIS — M66872 Spontaneous rupture of other tendons, left ankle and foot: Secondary | ICD-10-CM

## 2021-07-07 DIAGNOSIS — S93692S Other sprain of left foot, sequela: Secondary | ICD-10-CM | POA: Diagnosis not present

## 2021-07-07 NOTE — Progress Notes (Signed)
She presents today date of surgery 02/07/2021 status post EPF left states that the heel is bothering me really bad again actually seems to be hurting worse now than it was prior to my surgery.  And is also started to cause more of a sciatic pain in the same leg.  She states that her primary care provider Dr. [name] from [facility] recommended steroid will anti-inflammatories for what he diagnosed as sciatica most likely secondary to antalgia of the foot and gait compensation.  She states that last night her foot went numb.  She states that when she wears her cam walker she feels better.  She wonders if the fall immediately after surgery when she was on her way home could have caused any damage.  Objective: Vital signs are stable she is alert oriented x3 pulses are strongly palpable left foot.  There is no erythema edema cellulitis drainage or odor I see no signs of infection.  Still has moderate to severe pain on palpation medial calcaneal tubercle and plantar calcaneal tubercle.  Pain on palpation of the posterior tibial tendon with weakness on inversion against resistance  Assessment cannot rule out tear of the plantar fascia or the posterior tibial tendon  Plan: Discussed etiology pathology and surgical therapies at this point I feel an MRI would be necessary to help rule out any tears that actually came from a fall or worsening of her fasciitis.  I would like to follow-up with her after the MRI of the rear foot.  MRI is to consider differential diagnoses and surgical consideration.

## 2021-07-10 ENCOUNTER — Ambulatory Visit
Admission: RE | Admit: 2021-07-10 | Discharge: 2021-07-10 | Disposition: A | Discharge status: Home / Self Care | Payer: No Typology Code available for payment source | Source: Ambulatory Visit | Attending: Podiatry | Admitting: Podiatry

## 2021-07-10 ENCOUNTER — Other Ambulatory Visit: Payer: Self-pay

## 2021-07-10 DIAGNOSIS — S93692S Other sprain of left foot, sequela: Secondary | ICD-10-CM

## 2021-07-10 DIAGNOSIS — M66872 Spontaneous rupture of other tendons, left ankle and foot: Secondary | ICD-10-CM

## 2021-07-12 ENCOUNTER — Other Ambulatory Visit: Payer: No Typology Code available for payment source

## 2021-07-17 ENCOUNTER — Telehealth: Payer: Self-pay | Admitting: *Deleted

## 2021-07-17 NOTE — Telephone Encounter (Signed)
07/16/21:  Patient called complaining she was in a lot of pain. Advised she wear boot and stay off foot as much as possible.

## 2021-07-17 NOTE — Telephone Encounter (Signed)
Sent fax to [facility] requesting MRI disc to send out to [facility]

## 2021-07-31 ENCOUNTER — Ambulatory Visit (INDEPENDENT_AMBULATORY_CARE_PROVIDER_SITE_OTHER): Payer: No Typology Code available for payment source | Admitting: Dermatology

## 2021-07-31 ENCOUNTER — Other Ambulatory Visit: Payer: Self-pay

## 2021-07-31 DIAGNOSIS — L820 Inflamed seborrheic keratosis: Secondary | ICD-10-CM

## 2021-07-31 DIAGNOSIS — D23112 Other benign neoplasm of skin of right lower eyelid, including canthus: Secondary | ICD-10-CM

## 2021-07-31 DIAGNOSIS — Z1283 Encounter for screening for malignant neoplasm of skin: Secondary | ICD-10-CM

## 2021-07-31 DIAGNOSIS — D23122 Other benign neoplasm of skin of left lower eyelid, including canthus: Secondary | ICD-10-CM | POA: Diagnosis not present

## 2021-07-31 DIAGNOSIS — Z86018 Personal history of other benign neoplasm: Secondary | ICD-10-CM | POA: Diagnosis not present

## 2021-07-31 DIAGNOSIS — C44311 Basal cell carcinoma of skin of nose: Secondary | ICD-10-CM | POA: Diagnosis not present

## 2021-07-31 DIAGNOSIS — D1800 Hemangioma unspecified site: Secondary | ICD-10-CM

## 2021-07-31 DIAGNOSIS — L821 Other seborrheic keratosis: Secondary | ICD-10-CM

## 2021-07-31 DIAGNOSIS — L738 Other specified follicular disorders: Secondary | ICD-10-CM | POA: Diagnosis not present

## 2021-07-31 DIAGNOSIS — D239 Other benign neoplasm of skin, unspecified: Secondary | ICD-10-CM

## 2021-07-31 DIAGNOSIS — D485 Neoplasm of uncertain behavior of skin: Secondary | ICD-10-CM

## 2021-07-31 DIAGNOSIS — D229 Melanocytic nevi, unspecified: Secondary | ICD-10-CM

## 2021-07-31 DIAGNOSIS — L578 Other skin changes due to chronic exposure to nonionizing radiation: Secondary | ICD-10-CM

## 2021-07-31 DIAGNOSIS — L814 Other melanin hyperpigmentation: Secondary | ICD-10-CM

## 2021-07-31 NOTE — Patient Instructions (Signed)
Cryotherapy Aftercare  Wash gently with soap and water everyday.   Apply Vaseline and Band-Aid daily until healed.    If you have any questions or concerns for your doctor, please call our main line at [phone] and press option 4 to reach your doctor's medical assistant. If no one answers, please leave a voicemail as directed and we will return your call as soon as possible. Messages left after 4 pm will be answered the following business day.   You may also send [place] a message via [place]. We typically respond to MyChart messages within 1-2 business days.  For prescription refills, please ask your pharmacy to contact our office. Our fax number is [phone].  If you have an urgent issue when the clinic is closed that cannot wait until the next business day, you can page your doctor at the number below.    Please note that while we do our best to be available for urgent issues outside of office hours, we are not available 24/7.   If you have an urgent issue and are unable to reach [place], you may choose to seek medical care at your doctor's office, retail clinic, urgent care center, or emergency room.  If you have a medical emergency, please immediately call 911 or go to the emergency department.  Pager Numbers  - Dr. Nehemiah Massed: [phone]  - Dr. Laurence Ferrari: [phone]  - Dr. Nicole Kindred: [phone]  In the event of inclement weather, please call our main line at [phone] for an update on the status of any delays or closures.  Dermatology Medication Tips: Please keep the boxes that topical medications come in in order to help keep track of the instructions about where and how to use these. Pharmacies typically print the medication instructions only on the boxes and not directly on the medication tubes.   If your medication is too expensive, please contact our office at [phone] option 4 or send [place] a message through [place].   We are unable to tell what your co-pay for  medications will be in advance as this is different depending on your insurance coverage. However, we may be able to find a substitute medication at lower cost or fill out paperwork to get insurance to cover a needed medication.   If a prior authorization is required to get your medication covered by your insurance company, please allow [place] 1-2 business days to complete this process.  Drug prices often vary depending on where the prescription is filled and some pharmacies may offer cheaper prices.  The website www.goodrx.com contains coupons for medications through different pharmacies. The prices here do not account for what the cost may be with help from insurance (it may be cheaper with your insurance), but the website can give you the price if you did not use any insurance.  - You can print the associated coupon and take it with your prescription to the pharmacy.  - You may also stop by our office during regular business hours and pick up a GoodRx coupon card.  - If you need your prescription sent electronically to a different pharmacy, notify our office through [facility] or by phone at [phone] option 4.   Wound Care Instructions  Cleanse wound gently with soap and water once a day then pat dry with clean gauze. Apply a thing coat of Petrolatum (petroleum jelly, "Vaseline") over the wound (unless you have an allergy to this). We recommend that you use a new, sterile tube of Vaseline. Do not  pick or remove scabs. Do not remove the yellow or white "healing tissue" from the base of the wound.  Cover the wound with fresh, clean, nonstick gauze and secure with paper tape. You may use Band-Aids in place of gauze and tape if the would is small enough, but would recommend trimming much of the tape off as there is often too much. Sometimes Band-Aids can irritate the skin.  You should call the office for your biopsy report after 1 week if you have not already been contacted.  If you  experience any problems, such as abnormal amounts of bleeding, swelling, significant bruising, significant pain, or evidence of infection, please call the office immediately.  FOR ADULT SURGERY PATIENTS: If you need something for pain relief you may take 1 extra strength Tylenol (acetaminophen) AND 2 Ibuprofen (200mg  each) together every 4 hours as needed for pain. (do not take these if you are allergic to them or if you have a reason you should not take them.) Typically, you may only need pain medication for 1 to 3 days.

## 2021-07-31 NOTE — Progress Notes (Signed)
Follow-Up Visit   Subjective  Lacey Decker is a 50 y.o. female who presents for the following: Annual Exam (History of dysplastic nevi - TBSE today).  She has a sore on her nose that has bled in the past and it has been present for several months.  She also has bumps on her eyes she would like evaluated. The patient presents for Total-Body Skin Exam (TBSE) for skin cancer screening and mole check.  The following portions of the chart were reviewed this encounter and updated as appropriate:   Tobacco  Allergies  Meds  Problems  Med Hx  Surg Hx  Fam Hx     Review of Systems:  No other skin or systemic complaints except as noted in HPI or Assessment and Plan.  Objective  Well appearing patient in no apparent distress; mood and affect are within normal limits.  A full examination was performed including scalp, head, eyes, ears, nose, lips, neck, chest, axillae, abdomen, back, buttocks, bilateral upper extremities, bilateral lower extremities, hands, feet, fingers, toes, fingernails, and toenails. All findings within normal limits unless otherwise noted below.  Head - Anterior (Face) Yellow papules  Right Nasal Sidewall Crusted ulcer 0.7 cm         Lower eyelids Flesh colored papules  Left Lower Eyelid Erythematous keratotic or waxy stuck-on papule or plaque.    Assessment & Plan   Lentigines - Scattered tan macules - Due to sun exposure - Benign-appearing, observe - Recommend daily broad spectrum sunscreen SPF 30+ to sun-exposed areas, reapply every 2 hours as needed. - Call for any changes  Seborrheic Keratoses - Stuck-on, waxy, tan-brown papules and/or plaques  - Benign-appearing - Discussed benign etiology and prognosis. - Observe - Call for any changes  Melanocytic Nevi - Tan-brown and/or pink-flesh-colored symmetric macules and papules - Benign appearing on exam today - Observation - Call clinic for new or changing moles - Recommend daily use of  broad spectrum spf 30+ sunscreen to sun-exposed areas.   Hemangiomas - Red papules - Discussed benign nature - Observe - Call for any changes  Actinic Damage - Chronic condition, secondary to cumulative UV/sun exposure - diffuse scaly erythematous macules with underlying dyspigmentation - Recommend daily broad spectrum sunscreen SPF 30+ to sun-exposed areas, reapply every 2 hours as needed.  - Staying in the shade or wearing long sleeves, sun glasses (UVA+UVB protection) and wide brim hats (4-inch brim around the entire circumference of the hat) are also recommended for sun protection.  - Call for new or changing lesions.  Skin cancer screening performed today.  Sebaceous hyperplasia Head - Anterior (Face) Benign-appearing.  Observation.  Call clinic for new or changing lesions.  Recommend daily use of broad spectrum spf 30+ sunscreen to sun-exposed areas.   Neoplasm of uncertain behavior of skin Right Nasal Sidewall  Skin / nail biopsy Type of biopsy: tangential   Informed consent: discussed and consent obtained   Timeout: patient name, date of birth, surgical site, and procedure verified   Procedure prep:  Patient was prepped and draped in usual sterile fashion Prep type:  Isopropyl alcohol Anesthesia: the lesion was anesthetized in a standard fashion   Anesthetic:  1% lidocaine w/ epinephrine 1-100,000 buffered w/ 8.4% NaHCO3 Instrument used: flexible razor blade   Hemostasis achieved with: pressure, aluminum chloride and electrodesiccation   Outcome: patient tolerated procedure well   Post-procedure details: sterile dressing applied and wound care instructions given   Dressing type: bandage and petrolatum    Specimen  1 - Surgical pathology Differential Diagnosis: BCC vs other  Check Margins: No  Syringoma Lower eyelids Benign-appearing.  Observation.  Call clinic for new or changing lesions.  Recommend daily use of broad spectrum spf 30+ sunscreen to sun-exposed areas.   Discussed chronic and persistent nature.  Discussed there is no good treatment option for these.  We can try doing treatment with electrocautery destruction to see how 1 or 2 spots does not goes from there.  She may consider.  Inflamed seborrheic keratosis Left Lower Eyelid margin  Destruction of lesion - Left Lower Eyelid Complexity: simple   Destruction method: cryotherapy   Informed consent: discussed and consent obtained   Timeout:  patient name, date of birth, surgical site, and procedure verified Lesion destroyed using liquid nitrogen: Yes   Region frozen until ice ball extended beyond lesion: Yes   Outcome: patient tolerated procedure well with no complications   Post-procedure details: wound care instructions given    Return in about 6 months (around 01/28/2022).  I, Ashok Cordia, CMA, am acting as scribe for Sarina Ser, MD . Documentation: I have reviewed the above documentation for accuracy and completeness, and I agree with the above.  Sarina Ser, MD

## 2021-08-04 ENCOUNTER — Encounter: Payer: Self-pay | Admitting: Podiatry

## 2021-08-04 NOTE — Telephone Encounter (Signed)
Received Overread report-will send to Dr. Milinda Pointer for review

## 2021-08-05 ENCOUNTER — Telehealth: Payer: Self-pay

## 2021-08-05 DIAGNOSIS — C44311 Basal cell carcinoma of skin of nose: Secondary | ICD-10-CM

## 2021-08-05 DIAGNOSIS — C4491 Basal cell carcinoma of skin, unspecified: Secondary | ICD-10-CM

## 2021-08-05 HISTORY — DX: Basal cell carcinoma of skin, unspecified: C44.91

## 2021-08-05 NOTE — Telephone Encounter (Signed)
-----   Message from Ralene Bathe, MD sent at 08/05/2021 10:09 AM EDT ----- Diagnosis Skin , right nasal sidewall BASAL CELL CARCINOMA, NODULAR PATTERN, PERIPHERAL AND DEEP MARGINS INVOLVED  Cancer - BCC Schedule MOHS

## 2021-08-05 NOTE — Telephone Encounter (Signed)
Patient advised of BX results and referral sent to [facility] for [facility]. Went into great detail with patient regarding diagnosis and treatment.

## 2021-08-06 ENCOUNTER — Encounter: Payer: Self-pay | Admitting: Dermatology

## 2021-08-13 ENCOUNTER — Ambulatory Visit (INDEPENDENT_AMBULATORY_CARE_PROVIDER_SITE_OTHER): Payer: No Typology Code available for payment source | Admitting: Podiatry

## 2021-08-13 ENCOUNTER — Other Ambulatory Visit: Payer: Self-pay

## 2021-08-13 ENCOUNTER — Telehealth: Payer: Self-pay | Admitting: Family Medicine

## 2021-08-13 ENCOUNTER — Encounter: Payer: Self-pay | Admitting: Podiatry

## 2021-08-13 DIAGNOSIS — M5416 Radiculopathy, lumbar region: Secondary | ICD-10-CM

## 2021-08-13 DIAGNOSIS — G5702 Lesion of sciatic nerve, left lower limb: Secondary | ICD-10-CM

## 2021-08-13 DIAGNOSIS — S93692S Other sprain of left foot, sequela: Secondary | ICD-10-CM

## 2021-08-13 DIAGNOSIS — M7062 Trochanteric bursitis, left hip: Secondary | ICD-10-CM

## 2021-08-13 DIAGNOSIS — S93692D Other sprain of left foot, subsequent encounter: Secondary | ICD-10-CM

## 2021-08-13 NOTE — Telephone Encounter (Signed)
Pt called stating that she has her foot therapy at [facility] and and wanting to have verbal order for her hip there as well.  Fax # [phone].

## 2021-08-13 NOTE — Telephone Encounter (Signed)
Ok to order PT for hip>? Patient saw Dr Lorelei Pont in August for hip issues.

## 2021-08-13 NOTE — Progress Notes (Signed)
She presents today for follow-up of her MRI.  States that the foot is still very sore but her hip is really sore.  Objective: Vital signs stable alert oriented x3.  She still has tenderness on palpation of the posterior tibial tendon and of the plantar fascia.  MRI does demonstrate peritendinitis and tendinitis of the posterior tibial tendon with central band Planter fasciitis.  Assessment Planter fasciitis posterior tibial tendinitis.  Plan: At this point I am going to recommend aggressive physical therapy with her.  Hopefully this will prevent [place] from having to go to the operating room.

## 2021-08-15 NOTE — Telephone Encounter (Signed)
Patient advised. Patient states she was asking about the PT for left hip because Dr Lorelei Pont mentioned that would be the next step probably if she was still having trouble. Her podiatrist is referring her for PT for her foot so patient wanted to get both areas done at the same time if she needs PT for hip. Patient wonders if she needs to re try steroids again first before doing PT? States after seen Dr Lorelei Pont last time she did better with her hip after finishing Prednisone but then she had to go in the boot for her foot and as soon as she did that and was walking with it her hip was re in flamed. She is no longer using the boot and wonders if she needs to try medication again to calm it down before jumping into PT Or any other suggestions? Or just do PT?

## 2021-08-15 NOTE — Telephone Encounter (Signed)
Spencer.. since you saw her for this.. can you make recommendations?

## 2021-08-18 NOTE — Telephone Encounter (Signed)
I definitely think PT is a great idea.  I put in orders for her to work on it at [facility], where she is going for her foot.  While she is recovering from her plantar fascia rupture, I do not want to give her steroids.

## 2021-08-18 NOTE — Telephone Encounter (Signed)
While making pt aware of the referral, she wanted to know if another round of steroids or an anti inflammatory was going to be sent in for her. She states that she is in pain.   Please advise

## 2021-08-18 NOTE — Addendum Note (Signed)
Addended by: Owens Loffler on: 08/18/2021 09:11 AM   Modules accepted: Orders

## 2021-08-18 NOTE — Telephone Encounter (Signed)
Patient advised and verbalized understanding. Is there any other recommendations that patient can take for pain? She is hurting so bad per patient-"eating Ibuprofen" due to the pain. Patient states she usually does not want or need any pain medication with previous surgeries but this nerve pain is something else, patient states it is horrible, she has hard time sleeping. Any recommendations at all?

## 2021-08-19 MED ORDER — ETODOLAC 500 MG PO TABS: 500.0000 mg | ORAL_TABLET | Freq: Two times a day (BID) | ORAL | 3 refills | Status: DC

## 2021-08-19 MED ORDER — GABAPENTIN 100 MG PO CAPS: 100.0000 mg | ORAL_CAPSULE | Freq: Every day | ORAL | 1 refills | Status: DC

## 2021-08-19 NOTE — Telephone Encounter (Signed)
She also has a lot of pharmacies listed.  What is her preferred local pharmacy?  Feel free to send these in.

## 2021-08-19 NOTE — Telephone Encounter (Signed)
Can you call:  It is definitely ok to use some high dose anti-inflammatories.    She has had anaphylactic shock to Codeine - in her chart.  This makes use of narcotic pain medication extremely difficult or impossible.    I am sorry if my prior message was unclear, but I do not want her to take and steroids including prednisone while recovering from a plantar fascia rupture. (She is seeing podiatry.)  We can try some neuropathic pain medication.  I am going to send in low-dose gabapentin.  I really want to see her next week, preferably on Wednesday.

## 2021-08-19 NOTE — Telephone Encounter (Signed)
   Meds ordered this encounter  Medications   etodolac (LODINE) 500 MG tablet    Sig: Take 1 tablet (500 mg total) by mouth 2 (two) times daily.    Dispense:  60 tablet    Refill:  3   gabapentin (NEURONTIN) 100 MG capsule    Sig: Take 1 capsule (100 mg total) by mouth at bedtime.    Dispense:  30 capsule    Refill:  1

## 2021-08-19 NOTE — Addendum Note (Signed)
Addended by: Owens Loffler on: 08/19/2021 08:55 AM   Modules accepted: Orders

## 2021-08-19 NOTE — Telephone Encounter (Signed)
I went ahead and sent to [facility]

## 2021-08-19 NOTE — Telephone Encounter (Signed)
Left message for patient to call back and discuss [facility], that was already sent in

## 2021-08-20 MED ORDER — ETODOLAC 500 MG PO TABS: 500.0000 mg | ORAL_TABLET | Freq: Two times a day (BID) | ORAL | 3 refills | Status: DC

## 2021-08-20 MED ORDER — GABAPENTIN 100 MG PO CAPS: 100.0000 mg | ORAL_CAPSULE | Freq: Every day | ORAL | 1 refills | Status: DC

## 2021-08-20 NOTE — Addendum Note (Signed)
Addended by: Kris Mouton on: 08/20/2021 03:54 PM   Modules accepted: Orders

## 2021-08-20 NOTE — Telephone Encounter (Signed)
Patient advised. Pharmacy is [facility] location. Refill re sent to [facility] instead. [facility] cancelled. Patient states she has several appointments next week and has her PT appointment on 08/27/21, patient states it will take her 2 hours round trip to come to [place] location and back. She would like to start medication and start PT and follow up at a later time. Too many appointments right now and needing to be off so much.   FYI to Dr Lorelei Pont.

## 2021-09-02 ENCOUNTER — Encounter: Payer: Self-pay | Admitting: Family Medicine

## 2021-09-02 ENCOUNTER — Telehealth (INDEPENDENT_AMBULATORY_CARE_PROVIDER_SITE_OTHER): Payer: No Typology Code available for payment source | Admitting: Family Medicine

## 2021-09-02 ENCOUNTER — Other Ambulatory Visit: Payer: Self-pay

## 2021-09-02 VITALS — Ht 64.75 in | Wt 214.0 lb

## 2021-09-02 DIAGNOSIS — G5702 Lesion of sciatic nerve, left lower limb: Secondary | ICD-10-CM

## 2021-09-02 DIAGNOSIS — M7062 Trochanteric bursitis, left hip: Secondary | ICD-10-CM | POA: Insufficient documentation

## 2021-09-02 DIAGNOSIS — M5416 Radiculopathy, lumbar region: Secondary | ICD-10-CM | POA: Diagnosis not present

## 2021-09-02 DIAGNOSIS — K2210 Ulcer of esophagus without bleeding: Secondary | ICD-10-CM | POA: Diagnosis not present

## 2021-09-02 MED ORDER — OMEPRAZOLE 40 MG PO CPDR: DELAYED_RELEASE_CAPSULE | ORAL | 3 refills | Status: DC

## 2021-09-02 NOTE — Assessment & Plan Note (Signed)
Start PT as planned. Stop NSAIDs.  Use tylenol for pain. Follow up with Dr. Lorelei Pont if not improving.

## 2021-09-02 NOTE — Patient Instructions (Addendum)
Stop etodolac. Start  tylenol extra strength 2 tabs twice daily.' Start and complete physical therapy. Restart omeprazole 40 mg one to two times daily

## 2021-09-02 NOTE — Progress Notes (Signed)
Patient ID: Lacey Decker, female    DOB: 1971-04-17, 50 y.o.   MRN: [id number]  This visit was conducted in person.  Ht 5' 4.75" (1.645 m)   Wt 214 lb (97.1 kg)   BMI 35.89 kg/m    CC: Chief Complaint  Patient presents with   Leg Pain    Seen by Dr. Lorelei Pont 07/02/2021   Hip Pain        Medication Refill    Subjective:   HPI: Lacey Decker is a 50 y.o. female presenting on 09/02/2021 for Leg Pain (Seen by Dr. Lorelei Pont 07/02/2021), Hip Pain (/), and Medication Refill  Reviewed OV note from Dr. Lorelei Pont 07/02/2021 Dx:  left sided piriformis syndrome, acute lumbar radiculopathy, left hip trochanteric bursitis.. felt due to issue with gait due to foot issues ( rupture of plantar fascia, S/p surgical repair in 02/2021) Treated with prednisone 20 mg x 10 days ( 40/20)  Had improvement in symptoms.   Today she reports starting in last few months.  Dr Milinda Pointer.. has done repeat MRI of foot, now back in [place]. When started wearing this [place].. pain came back in hip and leg... pain in left buttock lateral hip left hip pain, radiate.  She  plan to try started PT for plantar fasciitis.     Tried etodolac... mild improvement in pain and it has increased her GERD.  Ibuprofen and asa did not help  Still cannot lie on left side.. Pain is worst at night  Relevant past medical, surgical, family and social history reviewed and updated as indicated. Interim medical history since our last visit reviewed. Allergies and medications reviewed and updated. Outpatient Medications Prior to Visit  Medication Sig Dispense Refill   etodolac (LODINE) 500 MG tablet Take 1 tablet (500 mg total) by mouth 2 (two) times daily. 60 tablet 3   fluticasone (FLONASE) 50 MCG/ACT nasal spray USE 2 SPRAYS IN EACH  NOSTRIL DAILY 48 g 3   LORazepam (ATIVAN) 0.5 MG tablet Take 1 tablet (0.5 mg total) by mouth 2 (two) times daily as needed for anxiety. 10 tablet 0   omeprazole (PRILOSEC) 40 MG capsule TAKE 1  CAPSULE BY MOUTH  TWICE DAILY BEFORE A MEAL 180 capsule 0   gabapentin (NEURONTIN) 100 MG capsule Take 1 capsule (100 mg total) by mouth at bedtime. (Patient not taking: Reported on 09/02/2021) 30 capsule 1   No facility-administered medications prior to visit.     Per HPI unless specifically indicated in ROS section below Review of Systems  Constitutional:  Negative for fatigue and fever.  HENT:  Negative for ear pain.   Eyes:  Negative for pain.  Respiratory:  Negative for chest tightness and shortness of breath.   Cardiovascular:  Negative for chest pain, palpitations and leg swelling.  Gastrointestinal:  Negative for abdominal pain.  Genitourinary:  Negative for dysuria.  Neurological:        Tingling in mouth with reflux  Objective:  Ht 5' 4.75" (1.645 m)   Wt 214 lb (97.1 kg)   BMI 35.89 kg/m   Wt Readings from Last 3 Encounters:  09/02/21 214 lb (97.1 kg)  07/02/21 218 lb 4 oz (99 kg)  05/15/21 212 lb (96.2 kg)      Physical Exam   Physical Exam Constitutional:      General: The patient is not in acute distress. Pulmonary:     Effort: Pulmonary effort is normal. No respiratory distress.  Neurological:     Mental  Status: The patient is alert and oriented to person, place, and time.  Psychiatric:        Mood and Affect: Mood normal.        Behavior: Behavior normal.   Results for orders placed or performed in visit on 05/15/21  POCT Wet Prep with KOH  Result Value Ref Range   Trichomonas, UA Negative    Clue Cells Wet Prep HPF POC neg    Epithelial Wet Prep HPF POC     Yeast Wet Prep HPF POC neg    Bacteria Wet Prep HPF POC     RBC Wet Prep HPF POC     WBC Wet Prep HPF POC     KOH Prep POC Negative Negative    This visit occurred during the SARS-CoV-2 public health emergency.  Safety protocols were in place, including screening questions prior to the visit, additional usage of staff PPE, and extensive cleaning of exam room while observing appropriate contact  time as indicated for disinfecting solutions.   COVID 19 screen:  No recent travel or known exposure to COVID19 The patient denies respiratory symptoms of COVID 19 at this time. The importance of social distancing was discussed today.   Assessment and Plan Problem List Items Addressed This Visit     Erosive esophagitis    Flare up of GERD on etodolac.  Stop NSAIDs and use tylenol for pain instead.      Lumbar radiculopathy, acute - Primary   Piriformis syndrome of left side   Trochanteric bursitis of left hip     Start PT as planned. Stop NSAIDs.  Use tylenol for pain. Follow up with Dr. Lorelei Pont if not improving.          Eliezer Lofts, MD

## 2021-09-02 NOTE — Assessment & Plan Note (Signed)
Flare up of GERD on etodolac.  Stop NSAIDs and use tylenol for pain instead.

## 2021-09-30 ENCOUNTER — Encounter: Payer: Self-pay | Admitting: Family Medicine

## 2021-09-30 ENCOUNTER — Telehealth (INDEPENDENT_AMBULATORY_CARE_PROVIDER_SITE_OTHER): Payer: No Typology Code available for payment source | Admitting: Family Medicine

## 2021-09-30 ENCOUNTER — Other Ambulatory Visit: Payer: Self-pay

## 2021-09-30 VITALS — Ht 64.75 in

## 2021-09-30 DIAGNOSIS — F310 Bipolar disorder, current episode hypomanic: Secondary | ICD-10-CM

## 2021-09-30 DIAGNOSIS — K2100 Gastro-esophageal reflux disease with esophagitis, without bleeding: Secondary | ICD-10-CM

## 2021-09-30 DIAGNOSIS — G5702 Lesion of sciatic nerve, left lower limb: Secondary | ICD-10-CM

## 2021-09-30 DIAGNOSIS — M7062 Trochanteric bursitis, left hip: Secondary | ICD-10-CM | POA: Diagnosis not present

## 2021-09-30 DIAGNOSIS — K2210 Ulcer of esophagus without bleeding: Secondary | ICD-10-CM | POA: Diagnosis not present

## 2021-09-30 NOTE — Assessment & Plan Note (Signed)
Poor control. Winter is a difficult time for her with depression and mood instability.  He counselor has retired and [facility] has no female accepting new pts per Ms. Ovid Curd.  Will refer externally.

## 2021-09-30 NOTE — Assessment & Plan Note (Signed)
Minimal improvement with PT, she is continuing home PT.  NSAIDS causing GI SE.. using tylenol.  Refer to ortho for possible steroid injection.

## 2021-09-30 NOTE — Assessment & Plan Note (Signed)
Improved back on omeprazole. Trigger avoidance. Avoid NSAIDS.

## 2021-09-30 NOTE — Progress Notes (Signed)
VIRTUAL VISIT Due to national recommendations of social distancing due to [place] 19, a virtual visit is felt to be most appropriate for this patient at this time.   I connected with the patient on 09/30/21 at  9:00 AM EST by virtual telehealth platform and verified that I am speaking with the correct person using two identifiers.   I discussed the limitations, risks, security and privacy concerns of performing an evaluation and management service by  virtual telehealth platform and the availability of in person appointments. I also discussed with the patient that there may be a patient responsible charge related to this service. The patient expressed understanding and agreed to proceed.  Patient location: Home Provider Location: [facility] [place] Participants: Eliezer Lofts and [name]   Chief Complaint  Patient presents with   Hip Pain    Left    History of Present Illness:  50 year old female patient with history of of left trochanteric bursitis, piriformis syndrome and lumbar radiculopathy presents with continued left hip pain.  Reviewed OV note from Dr. Lorelei Pont 07/02/2021 Dx:  left sided piriformis syndrome, acute lumbar radiculopathy, left hip trochanteric bursitis.. felt due to issue with gait due to foot issues ( rupture of plantar fascia, S/p surgical repair in 02/2021) Treated with prednisone 20 mg x 10 days ( 40/20)  Had improvement in symptoms.  At last OV on 09/02/2021: Had recurrent issues with foot... so hip issues returned as well when had to wear boot. Tried etodolac... mild improvement in pain and it has increased her GERD.  Ibuprofen and asa did not help  She was referred to PT for plantar fasciitis and hip pain. Told to use tylenol and restart omeprazole for GI SE to NSAIDs that had developed.   Today she reports   she started PT ( completed 6 weeks), but had to stop given surgery on nose  1 week ago for skin cancer... has restrictions.   She continues doing PT  at home.  She is no longer waking up at night with hip pain. Still pain laterally and runs into hip, no buttock or low back pain.  Dopes have pain in left hip when standing up.. grocery shopping.  GI symptoms have resolved on omeprazole.   Foot has improving some.. no pain in last week. She has been resting. COVID 19 screen No recent travel or known exposure to COVID19 The patient denies respiratory symptoms of COVID 19 at this time.  The importance of social distancing was discussed today.   Review of Systems  Constitutional:  Negative for chills and fever.  HENT:  Negative for congestion and ear pain.   Eyes:  Negative for pain and redness.  Respiratory:  Negative for cough and shortness of breath.   Cardiovascular:  Negative for chest pain, palpitations and leg swelling.  Gastrointestinal:  Negative for abdominal pain, blood in stool, constipation, diarrhea, nausea and vomiting.  Genitourinary:  Negative for dysuria.  Musculoskeletal:  Negative for falls and myalgias.  Skin:  Negative for rash.  Neurological:  Negative for dizziness.  Psychiatric/Behavioral:  Positive for depression. Negative for suicidal ideas. The patient is nervous/anxious and has insomnia.      Past Medical History:  Diagnosis Date   Adenomyosis    Alcohol abuse    last drink one year ago   Anxiety    Bipolar 1 disorder ([place])    Chlamydia    as a teen   Dysplastic nevus 01/13/2010   left lat sacral,  sacral, 0.4 x 0.3cm - DYSPLASTIC DYSPLASTIC JUNCTIONAL JUNCTIONAL NEVUS WITH MILD MELANOCYTIC MELANOCYTIC ATYPIA   Dysplastic nevus 01/13/2010   left ant axillary fold 0.3 cm - dysplastic compound nevus with mild melanocytic atypia   Endometriosis    GERD (gastroesophageal reflux disease)    MRSA (methicillin resistant staph aureus) culture positive    PMDD (premenstrual dysphoric disorder)    SUI (stress urinary incontinence, female)     reports that she has never smoked. She has never used smokeless  tobacco. She reports that she does not drink alcohol and does not use drugs.   Current Outpatient Medications:    fluticasone (FLONASE) 50 MCG/ACT nasal spray, USE 2 SPRAYS IN EACH  NOSTRIL DAILY, Disp: 48 g, Rfl: 3   LORazepam (ATIVAN) 0.5 MG tablet, Take 1 tablet (0.5 mg total) by mouth 2 (two) times daily as needed for anxiety., Disp: 10 tablet, Rfl: 0   omeprazole (PRILOSEC) 40 MG capsule, TAKE 1 CAPSULE BY MOUTH  TWICE DAILY BEFORE A MEAL, Disp: 180 capsule, Rfl: 3   Observations/Objective: Height 5' 4.75" (1.645 m).  Physical Exam  Physical Exam Constitutional:      General: The patient is not in acute distress. Pulmonary:     Effort: Pulmonary effort is normal. No respiratory distress.  Neurological:     Mental Status: The patient is alert and oriented to person, place, and time.  Psychiatric:        Mood and Affect: Mood normal.        Behavior: Behavior normal.   Assessment and Plan    Problem List Items Addressed This Visit     Bipolar disorder ([place])    Poor control. Winter is a difficult time for her with depression and mood instability.  He counselor has retired and [facility] has no female accepting new pts per Ms. Ovid Curd.  Will refer externally.      Relevant Orders   Ambulatory referral to Psychology   Erosive esophagitis   Gastroesophageal reflux disease with esophagitis without hemorrhage    Improved back on omeprazole. Trigger avoidance. Avoid NSAIDS.      Piriformis syndrome of left side   Relevant Orders   Ambulatory referral to Orthopedic Surgery   Trochanteric bursitis of left hip - Primary     Minimal improvement with PT, she is continuing home PT.  NSAIDS causing GI SE.. using tylenol.  Refer to ortho for possible steroid injection.      Relevant Orders   Ambulatory referral to Orthopedic Surgery    I discussed the assessment and treatment plan with the patient. The patient was provided an opportunity to ask questions and all were answered. The  patient agreed with the plan and demonstrated an understanding of the instructions.   The patient was advised to call back or seek an in-person evaluation if the symptoms worsen or if the condition fails to improve as anticipated.     Eliezer Lofts, MD

## 2021-10-06 ENCOUNTER — Telehealth: Payer: Self-pay | Admitting: Family Medicine

## 2021-10-06 NOTE — Telephone Encounter (Signed)
Pt wants an update on referral request that was sent in. She is in a lot of pain with her hip.

## 2021-10-07 ENCOUNTER — Encounter: Payer: Self-pay | Admitting: *Deleted

## 2021-10-07 NOTE — Telephone Encounter (Signed)
Spoke with Kenney Houseman and confirmed her appointment with [facility] tomorrow.  She states she did get [place] message and is very grateful that her appointment is tomorrow.

## 2021-10-07 NOTE — Telephone Encounter (Signed)
I have called twice, left a detailed message with appt details  I have also sent a mychart message with all the appt information.   Need to follow up to ensure patient is aware of appt tomorrow as they are working her in.

## 2021-10-07 NOTE — Telephone Encounter (Signed)
I was able to get the patient worked in tomorrow 10/08/21 at [place] with [facility] in [place] - scheduled with Roland Rack, PA-C.  I have called patient twice, left a detailed message with appt details  I have also sent a mychart message with all the appt information.   Butch Penny, CMA is aware of my attempts to reach the patient and that we need to follow up to ensure patient is aware of appt tomorrow as they are working her in.

## 2021-10-07 NOTE — Telephone Encounter (Signed)
Pt called checking on the status of her referral. Pt states that she cant sleep because of the pain. Pt states she has to take her hand and pick up her leg to get in the car.

## 2021-10-10 ENCOUNTER — Encounter: Payer: Self-pay | Admitting: *Deleted

## 2021-10-20 ENCOUNTER — Telehealth: Payer: Self-pay | Admitting: Family Medicine

## 2021-10-20 NOTE — Telephone Encounter (Signed)
Pt called stating that she need a order for an ultra sound for her right breast IMG.27. Pt states she would like this order at [facility]). Please advise.

## 2021-10-21 ENCOUNTER — Other Ambulatory Visit: Payer: Self-pay | Admitting: Family Medicine

## 2021-10-21 ENCOUNTER — Encounter: Payer: Self-pay | Admitting: *Deleted

## 2021-10-21 DIAGNOSIS — R590 Localized enlarged lymph nodes: Secondary | ICD-10-CM

## 2021-10-21 NOTE — Telephone Encounter (Signed)
Order placed

## 2021-10-21 NOTE — Progress Notes (Signed)
Please call to find out reason for [place] and verify that is all she needs. Then I will complete order below

## 2021-10-21 NOTE — Progress Notes (Signed)
Spoke with [place].  When she went for her mammogram in July there was some swollen lymph nodes and they wanted to repeat U/S in 3 months.  Per Mammogram Report:  Right axillary ultrasound in 3 months for follow-up of probably benign right axillary lymph nodes.

## 2021-10-28 DIAGNOSIS — M1611 Unilateral primary osteoarthritis, right hip: Secondary | ICD-10-CM | POA: Insufficient documentation

## 2021-11-05 ENCOUNTER — Ambulatory Visit
Admission: RE | Admit: 2021-11-05 | Discharge: 2021-11-05 | Disposition: A | Discharge status: Home / Self Care | Payer: No Typology Code available for payment source | Source: Ambulatory Visit | Attending: Family Medicine | Admitting: Family Medicine

## 2021-11-05 ENCOUNTER — Other Ambulatory Visit: Payer: Self-pay

## 2021-11-05 DIAGNOSIS — R590 Localized enlarged lymph nodes: Secondary | ICD-10-CM | POA: Diagnosis present

## 2021-11-19 ENCOUNTER — Telehealth: Payer: No Typology Code available for payment source | Admitting: Nurse Practitioner

## 2021-11-20 ENCOUNTER — Telehealth: Payer: No Typology Code available for payment source | Admitting: Emergency Medicine

## 2021-11-20 DIAGNOSIS — U071 COVID-19: Secondary | ICD-10-CM | POA: Diagnosis not present

## 2021-11-20 MED ORDER — IPRATROPIUM BROMIDE 0.03 % NA SOLN: 2.0000 | Freq: Two times a day (BID) | NASAL | 0 refills | Status: DC

## 2021-11-20 NOTE — Progress Notes (Signed)
Virtual Visit Consent   Lacey Decker, you are scheduled for a virtual visit with a [place] provider today.     Just as with appointments in the office, your consent must be obtained to participate.  Your consent will be active for this visit and any virtual visit you may have with one of our providers in the next 365 days.     If you have a MyChart account, a copy of this consent can be sent to you electronically.  All virtual visits are billed to your insurance company just like a traditional visit in the office.    As this is a virtual visit, video technology does not allow for your provider to perform a traditional examination.  This may limit your provider's ability to fully assess your condition.  If your provider identifies any concerns that need to be evaluated in person or the need to arrange testing (such as labs, EKG, etc.), we will make arrangements to do so.     Although advances in technology are sophisticated, we cannot ensure that it will always work on either your end or our end.  If the connection with a video visit is poor, the visit may have to be switched to a telephone visit.  With either a video or telephone visit, we are not always able to ensure that we have a secure connection.     I need to obtain your verbal consent now.   Are you willing to proceed with your visit today?    Lacey Decker has provided verbal consent on 11/20/2021 for a virtual visit video.   Lacey Circle, PA-C   Date: 11/20/2021 10:44 AM   Virtual Visit via Video Note   I, Lacey Decker, connected with  Lacey Decker  ([id number], 21-Jun-1971) on 11/20/21 at 10:45 AM EST by a video-enabled telemedicine application and verified that I am speaking with the correct person using two identifiers.  Location: Patient: Virtual Visit Location Patient: Home Provider: Virtual Visit Location Provider: Home Office   I discussed the limitations of evaluation and management by telemedicine and the  availability of in person appointments. The patient expressed understanding and agreed to proceed.    History of Present Illness: Lacey Decker is a 51 y.o. who identifies as a female who was assigned female at birth, and is being seen today for COVID.  States that she tested positive on Tuesday night.  States that her mother had been sick COVID and was admitted in the hospital.  Symptoms started on Sunday night.  Reports fevers, chills, body aches.  Reports sinus congestion, sore throat.  Has tried using home remedies without much relief.  HPI: HPI  Problems:  Patient Active Problem List   Diagnosis Date Noted   Lumbar radiculopathy, acute 09/02/2021   Piriformis syndrome of left side 09/02/2021   Trochanteric bursitis of left hip 09/02/2021   Prediabetes 04/22/2021   BMI 35.0-35.9,adult 04/22/2021   Palpitation 11/19/2020   Stress reaction 11/19/2020   COVID-19 10/24/2020   Myalgia 10/24/2020   Muscle twitching 06/28/2020   Advice given about COVID-19 virus infection 06/28/2020   Gastroesophageal reflux disease with esophagitis without hemorrhage    Esophageal dysphagia    Colon cancer screening    Anxiety and depression 08/22/2019   Perimenopausal vasomotor symptoms 08/22/2019   Neck pain on left side 09/02/2018   Anterior chest wall pain 09/02/2018   Urinary frequency 05/08/2018   Urinary incontinence without sensory awareness 02/08/2018   PMDD (  premenstrual dysphoric disorder) 02/08/2018   SUI (stress urinary incontinence, female) 02/08/2018   Urinary incontinence 01/27/2018   Trapezius strain, left, initial encounter 01/18/2018   Right elbow pain 10/15/2016   Hot flashes 08/22/2013   Other malaise and fatigue 08/22/2013   Neck pain on right side 07/17/2013   Medial epicondylitis of left elbow 07/17/2013   Right sided abdominal pain 01/02/2013   Rash 12/02/2011   OBESITY, UNSPECIFIED 11/21/2010   Allergic rhinitis 11/21/2010   MIGRAINE, COMMON, INTRACTABLE 01/29/2009    MRSA 11/24/2008   VERTEBROBASILAR INSUFFICIENCY 07/08/2007   Bipolar disorder ([place]) 01/26/2007   Generalized anxiety disorder 01/06/2007   PANIC ATTACKS 01/06/2007   Erosive esophagitis 01/06/2007   DYSFUNCTIONAL UTERINE BLEEDING 01/06/2007   ROSACEA 01/06/2007    Allergies:  Allergies  Allergen Reactions   Codeine     REACTION: anaphylactic shock   Morphine     REACTION: swelling   Augmentin [Amoxicillin-Pot Clavulanate]    Clarithromycin     REACTION: Trouble breathing - stomach upset - bad taste in mouth   Erythromycin Base Other (See Comments)    Eye ointment - worsened conjunctivitis and swelling   Sulfamethoxazole-Trimethoprim     REACTION: N \\T \ V, rash   Medications:  Current Outpatient Medications:    fluticasone (FLONASE) 50 MCG/ACT nasal spray, USE 2 SPRAYS IN EACH  NOSTRIL DAILY, Disp: 48 g, Rfl: 3   LORazepam (ATIVAN) 0.5 MG tablet, Take 1 tablet (0.5 mg total) by mouth 2 (two) times daily as needed for anxiety., Disp: 10 tablet, Rfl: 0   omeprazole (PRILOSEC) 40 MG capsule, TAKE 1 CAPSULE BY MOUTH  TWICE DAILY BEFORE A MEAL, Disp: 180 capsule, Rfl: 3  Observations/Objective: Patient is well-developed, well-nourished in no acute distress.  Resting comfortably at home.  Head is normocephalic, atraumatic.  No labored breathing.  Speech is clear and coherent with logical content.  Patient is alert and oriented at baseline.  Mildly hoarse voice, mild rhinorrhea, no coughing  Assessment and Plan: There are no diagnoses linked to this encounter. -Atrovent nasal spray -Offered antiviral, but patient declined -Encouraged f/u in 3-5 days if not better.  Could be developing post-covid sinus infection.  Follow Up Instructions: I discussed the assessment and treatment plan with the patient. The patient was provided an opportunity to ask questions and all were answered. The patient agreed with the plan and demonstrated an understanding of the instructions.  A copy of  instructions were sent to the patient via MyChart unless otherwise noted below.    The patient was advised to call back or seek an in-person evaluation if the symptoms worsen or if the condition fails to improve as anticipated.  Time:  I spent 12 minutes with the patient via telehealth technology discussing the above problems/concerns.    Lacey Circle, PA-C

## 2021-12-02 ENCOUNTER — Telehealth: Payer: Self-pay

## 2021-12-02 NOTE — Telephone Encounter (Signed)
Updated specimen tracking and history. aw 

## 2022-01-05 ENCOUNTER — Other Ambulatory Visit: Payer: Self-pay

## 2022-01-05 ENCOUNTER — Encounter (HOSPITAL_COMMUNITY): Payer: Self-pay | Admitting: Emergency Medicine

## 2022-01-05 ENCOUNTER — Telehealth: Payer: Self-pay

## 2022-01-05 ENCOUNTER — Ambulatory Visit (HOSPITAL_COMMUNITY)
Admission: EM | Admit: 2022-01-05 | Discharge: 2022-01-05 | Disposition: A | Discharge status: Home / Self Care | Payer: No Typology Code available for payment source | Attending: Family Medicine | Admitting: Family Medicine

## 2022-01-05 DIAGNOSIS — R208 Other disturbances of skin sensation: Secondary | ICD-10-CM | POA: Diagnosis not present

## 2022-01-05 DIAGNOSIS — R110 Nausea: Secondary | ICD-10-CM | POA: Insufficient documentation

## 2022-01-05 DIAGNOSIS — K146 Glossodynia: Secondary | ICD-10-CM | POA: Insufficient documentation

## 2022-01-05 LAB — CBC WITH DIFFERENTIAL/PLATELET
Abs Immature Granulocytes: 0.02 10*3/uL (ref 0.00–0.07)
Basophils Absolute: 0.1 10*3/uL (ref 0.0–0.1)
Basophils Relative: 1 %
Eosinophils Absolute: 0.1 10*3/uL (ref 0.0–0.5)
Eosinophils Relative: 1 %
HCT: 42.1 % (ref 36.0–46.0)
Hemoglobin: 13.9 g/dL (ref 12.0–15.0)
Immature Granulocytes: 0 %
Lymphocytes Relative: 22 %
Lymphs Abs: 1.8 10*3/uL (ref 0.7–4.0)
MCH: 32.3 pg (ref 26.0–34.0)
MCHC: 33 g/dL (ref 30.0–36.0)
MCV: 97.9 fL (ref 80.0–100.0)
Monocytes Absolute: 0.6 10*3/uL (ref 0.1–1.0)
Monocytes Relative: 7 %
Neutro Abs: 5.7 10*3/uL (ref 1.7–7.7)
Neutrophils Relative %: 69 %
Platelets: 373 10*3/uL (ref 150–400)
RBC: 4.3 MIL/uL (ref 3.87–5.11)
RDW: 13.2 % (ref 11.5–15.5)
WBC: 8.2 10*3/uL (ref 4.0–10.5)
nRBC: 0 % (ref 0.0–0.2)

## 2022-01-05 LAB — COMPREHENSIVE METABOLIC PANEL
ALT: 20 U/L (ref 0–44)
AST: 16 U/L (ref 15–41)
Albumin: 4.3 g/dL (ref 3.5–5.0)
Alkaline Phosphatase: 66 U/L (ref 38–126)
Anion gap: 10 (ref 5–15)
BUN: 27 mg/dL — ABNORMAL HIGH (ref 6–20)
CO2: 27 mmol/L (ref 22–32)
Calcium: 9.7 mg/dL (ref 8.9–10.3)
Chloride: 103 mmol/L (ref 98–111)
Creatinine, Ser: 0.88 mg/dL (ref 0.44–1.00)
GFR, Estimated: >60 mL/min (ref >60)
Glucose, Bld: 116 mg/dL — ABNORMAL HIGH (ref 70–99)
Potassium: 4.8 mmol/L (ref 3.5–5.1)
Sodium: 140 mmol/L (ref 135–145)
Total Bilirubin: 0.3 mg/dL (ref 0.3–1.2)
Total Protein: 7.4 g/dL (ref 6.5–8.1)

## 2022-01-05 LAB — CBG MONITORING, ED: Glucose-Capillary: 105 mg/dL — ABNORMAL HIGH (ref 70–99)

## 2022-01-05 LAB — VITAMIN B12: Vitamin B-12: 300 pg/mL (ref 180–914)

## 2022-01-05 NOTE — ED Triage Notes (Signed)
Pt reports mouth and tongue pain x 1 month. Pt states she first noticed the symptoms after having Covid at the end of January. States mouth and tongue has a burning sensation after eating or drinking and feels dizzy and nauseous after eating and drinking as well.

## 2022-01-05 NOTE — Telephone Encounter (Signed)
[facility] - Client TELEPHONE ADVICE RECORD Access[facility]urse Patient Name: Lacey Decker Gender: Female DOB: [birth year] Age: 51 Y 29 D Return Phone Number: [phone] (Primary) Address: City/ State/ Zip: [place]  [address] Client [facility] - Client Client Site [place] - Day Provider Eliezer Lofts - MD Contact Type Call Who Is Calling Patient / Member / Family / Caregiver Call Type Triage / Clinical Relationship To Patient Self Return Phone Number [phone] (Primary) Chief Complaint Numbness Reason for Call Symptomatic / Request for Health Information Initial Comment Caller transferred from the office. Caller states she had covid a few weeks back. She feels like her tongue is burning and numb. She now feels like her throat is also burning. [facility] Translation No Nurse Assessment Nurse: Zenia Resides, RN, Diane Date/Time Eilene Ghazi Time): 01/05/2022 9:01:03 AM Confirm and document reason for call. If symptomatic, describe symptoms. ---Caller states she feels like her tongue is burning when she eats or drinks. Started when she had COVID. Tongue is red, but not swollen. Has lasted for three weeks. Feels like it is going into her throat. No fever. No difficulty breathing or swallowing. No sores or open areas. Does the patient have any new or worsening symptoms? ---Yes Will a triage be completed? ---Yes Related visit to physician within the last 2 weeks? ---No Does the PT have any chronic conditions? (i.e. diabetes, asthma, this includes High risk factors for pregnancy, etc.) ---Yes List chronic conditions. ---anxiety, pre DM Is the patient pregnant or possibly pregnant? (Ask all females between the ages of 19-55) ---No Is this a behavioral health or substance abuse call? ---No PLEASE NOTE: All timestamps contained within this report are represented as [place]  Standard Time. CONFIDENTIALTY NOTICE: This fax transmission is intended only for the addressee. It contains information that is legally privileged, confidential or otherwise protected from use or disclosure. If you are not the intended recipient, you are strictly prohibited from reviewing, disclosing, copying using or disseminating any of this information or taking any action in reliance on or regarding this information. If you have received this fax in error, please notify us immediately by telephone so that we can arrange for its return to [place]. Phone: [phone], Toll-Free: [phone], Fax: [phone] Page: 2 of 2 Call Id: [id number] Guidelines Guideline Title Affirmed Question Affirmed Notes Nurse Date/Time Eilene Ghazi Time) Mouth Pain Patient sounds very sick or weak to the triager Zenia Resides, RN, Diane 01/05/2022 9:05:31 AM Disp. Time Eilene Ghazi Time) Disposition Final User 01/05/2022 9:08:41 AM Go to ED Now (or PCP triage) Yes Zenia Resides, RN, Diane Caller Disagree/Comply Comply Caller Understands Yes PreDisposition Did not know what to do Care Advice Given Per Guideline GO TO ED NOW (OR PCP TRIAGE): * IF NO PCP (PRIMARY CARE PROVIDER) SECOND-LEVEL TRIAGE: You need to be seen within the next hour. Go to the [facility] at _____________ [place] as soon as you can. ANOTHER ADULT SHOULD DRIVE: * It is better and safer if another adult drives instead of you. CARE ADVICE given per Mouth Pain (Adult) guideline. Comments User: Hildred Priest, RN Date/Time Eilene Ghazi Time): 01/05/2022 9:09:22 AM Pt. would like to cancel Wed. appt. Referrals GO TO FACILITY OTHER - SPECIF

## 2022-01-05 NOTE — Discharge Instructions (Addendum)
You were seen today for burning mouth and tongue, as well as feeling off.  You exam is overall normal today.  Nothing urgent or concerning.  Your blood sugar was normal at 105.   As discussed, this could be due to covid infection, vitamin deficiency, or other etiology.  I have done basic blood work today to check this.  These will be resulted by tomorrow via my chart.   You should follow up with your primary care provider on Thursday as discussed as well for further work up and treatment if needed.

## 2022-01-05 NOTE — Telephone Encounter (Signed)
Per chart review tab pt is at [facility]. Sending note to Dr Diona Browner and Butch Penny CMA.

## 2022-01-05 NOTE — ED Provider Notes (Signed)
[facility]    CSN: [id number] Arrival date & time: 01/05/22  1010      History   Chief Complaint Chief Complaint  Patient presents with   mouth pain    HPI Lacey Decker is a 51 y.o. female.   Patient is here for not feeling well.  She had covid, and since then noted burning of her tongue, feels numb at times.  Anything she drinks/eats the whole mouth feels like it on fire.  She was unable to drink her protein shake this morning.  Overall she thinks the whole tongue feels numb.   She is feeling "weird".  After eating she feels "off", dizzy, nausea.  She wonders if that is her sugar as she was pre-diabetic already.  She has been getting steroid shots in her hip due to hip pain.   She does have a pcp, and has an apt on Thursday of this week  She admits to having some anxiety about this.  No URI symptoms currently.  Slight sinus pressure.   Past Medical History:  Diagnosis Date   Adenomyosis    Alcohol abuse    last drink one year ago   Anxiety    Basal cell carcinoma 08/05/2021   R nasal sidewall, MOHs completed 09/23/21   Bipolar 1 disorder ([place])    Chlamydia    as a teen   Dysplastic nevus 01/13/2010   left lat sacral, sacral, 0.4 x 0.3cm - DYSPLASTIC DYSPLASTIC JUNCTIONAL JUNCTIONAL NEVUS WITH MILD MELANOCYTIC MELANOCYTIC ATYPIA   Dysplastic nevus 01/13/2010   left ant axillary fold 0.3 cm - dysplastic compound nevus with mild melanocytic atypia   Endometriosis    GERD (gastroesophageal reflux disease)    MRSA (methicillin resistant staph aureus) culture positive    PMDD (premenstrual dysphoric disorder)    SUI (stress urinary incontinence, female)     Patient Active Problem List   Diagnosis Date Noted   Lumbar radiculopathy, acute 09/02/2021   Piriformis syndrome of left side 09/02/2021   Trochanteric bursitis of left hip 09/02/2021   Prediabetes 04/22/2021   BMI 35.0-35.9,adult 04/22/2021   Palpitation 11/19/2020   Stress reaction 11/19/2020    COVID-19 10/24/2020   Myalgia 10/24/2020   Muscle twitching 06/28/2020   Advice given about COVID-19 virus infection 06/28/2020   Gastroesophageal reflux disease with esophagitis without hemorrhage    Esophageal dysphagia    Colon cancer screening    Anxiety and depression 08/22/2019   Perimenopausal vasomotor symptoms 08/22/2019   Neck pain on left side 09/02/2018   Anterior chest wall pain 09/02/2018   Urinary frequency 05/08/2018   Urinary incontinence without sensory awareness 02/08/2018   PMDD (premenstrual dysphoric disorder) 02/08/2018   SUI (stress urinary incontinence, female) 02/08/2018   Urinary incontinence 01/27/2018   Trapezius strain, left, initial encounter 01/18/2018   Right elbow pain 10/15/2016   Hot flashes 08/22/2013   Other malaise and fatigue 08/22/2013   Neck pain on right side 07/17/2013   Medial epicondylitis of left elbow 07/17/2013   Right sided abdominal pain 01/02/2013   Rash 12/02/2011   OBESITY, UNSPECIFIED 11/21/2010   Allergic rhinitis 11/21/2010   MIGRAINE, COMMON, INTRACTABLE 01/29/2009   MRSA 11/24/2008   VERTEBROBASILAR INSUFFICIENCY 07/08/2007   Bipolar disorder ([place]) 01/26/2007   Generalized anxiety disorder 01/06/2007   PANIC ATTACKS 01/06/2007   Erosive esophagitis 01/06/2007   DYSFUNCTIONAL UTERINE BLEEDING 01/06/2007   ROSACEA 01/06/2007    Past Surgical History:  Procedure Laterality Date   ABDOMINAL HYSTERECTOMY  2009   total lap hyst, LSO due to adenomyosis/endometriosis   btl  11/09/1998   COLONOSCOPY WITH PROPOFOL N/A 01/10/2020   Procedure: COLONOSCOPY WITH PROPOFOL;  Surgeon: Lin Landsman, MD;  Location: [facility] ENDOSCOPY;  Service: Gastroenterology;  Laterality: N/A;   DILATION AND CURETTAGE OF UTERUS  1997   after miscarriage   ESOPHAGOGASTRODUODENOSCOPY (EGD) WITH PROPOFOL N/A 01/10/2020   Procedure: ESOPHAGOGASTRODUODENOSCOPY (EGD) WITH PROPOFOL;  Surgeon: Lin Landsman, MD;  Location: [facility];   Service: Gastroenterology;  Laterality: N/A;   LEEP  2000   misscarriage     x5   NEUROMA SURGERY     lipoma removal Right labia   nsvd     3    OB History     Gravida  10   Para  3   Term  3   Preterm      AB  5   Living  3      SAB  5   IAB      Ectopic      Multiple      Live Births               Home Medications    Prior to Admission medications   Medication Sig Start Date End Date Taking? Authorizing Provider  fluticasone (FLONASE) 50 MCG/ACT nasal spray USE 2 SPRAYS IN EACH  NOSTRIL DAILY 10/17/19   Bedsole, Amy E, MD  ipratropium (ATROVENT) 0.03 % nasal spray Place 2 sprays into both nostrils every 12 (twelve) hours. 11/20/21   Montine Circle, PA-C  LORazepam (ATIVAN) 0.5 MG tablet Take 1 tablet (0.5 mg total) by mouth 2 (two) times daily as needed for anxiety. 04/22/21   Bedsole, Amy E, MD  omeprazole (PRILOSEC) 40 MG capsule TAKE 1 CAPSULE BY MOUTH  TWICE DAILY BEFORE A MEAL 09/02/21   Bedsole, Amy E, MD    Family History Family History  Problem Relation Age of Onset   Hypertension Mother    Hyperlipidemia Mother    Depression Mother    Basal cell carcinoma Mother    Colon polyps Mother    Bipolar disorder Sister    Depression Maternal Grandmother    Lung cancer Maternal Grandfather    Breast cancer Neg Hx     Social History Social History   Tobacco Use   Smoking status: Never   Smokeless tobacco: Never  Vaping Use   Vaping Use: Never used  Substance Use Topics   Alcohol use: No    Alcohol/week: 0.0 standard drinks   Drug use: No     Allergies   Codeine, Morphine, Augmentin [amoxicillin-pot clavulanate], Clarithromycin, Erythromycin base, and Sulfamethoxazole-trimethoprim   Review of Systems Review of Systems  Constitutional:  Positive for fatigue. Negative for activity change and chills.  HENT:  Positive for sinus pain. Negative for congestion and mouth sores.   Respiratory: Negative.    Cardiovascular: Negative.    Gastrointestinal:  Positive for nausea. Negative for vomiting.    Physical Exam Triage Vital Signs ED Triage Vitals [01/05/22 1137]  Enc Vitals Group     BP (!) 150/85     Pulse Rate 73     Resp 16     Temp 97.7 F (36.5 C)     Temp Source Oral     SpO2 96 %     Weight      Height      Head Circumference      Peak Flow  Pain Score 0     Pain Loc      Pain Edu?      Excl. in [place]?    No data found.  Updated Vital Signs BP (!) 150/85 (BP Location: Left Arm)    Pulse 73    Temp 97.7 F (36.5 C) (Oral)    Resp 16    SpO2 96%   Visual Acuity Right Eye Distance:   Left Eye Distance:   Bilateral Distance:    Right Eye Near:   Left Eye Near:    Bilateral Near:     Physical Exam Constitutional:      Appearance: Normal appearance.  HENT:     Head: Normocephalic and atraumatic.     Mouth/Throat:     Lips: Pink.     Mouth: Mucous membranes are moist. No oral lesions or angioedema.     Dentition: Normal dentition. No gum lesions.     Tongue: No lesions. Tongue does not deviate from midline.     Palate: No mass and lesions.     Pharynx: Oropharynx is clear. Uvula midline. No pharyngeal swelling, oropharyngeal exudate or posterior oropharyngeal erythema.     Comments: Normal sensation to the tongue and palate Cardiovascular:     Rate and Rhythm: Normal rate and regular rhythm.  Pulmonary:     Effort: Pulmonary effort is normal.     Breath sounds: Normal breath sounds.  Musculoskeletal:     Cervical back: Normal range of motion and neck supple. No tenderness.  Lymphadenopathy:     Cervical: No cervical adenopathy.  Neurological:     Mental Status: She is alert.     UC Treatments / Results  Labs (all labs ordered are listed, but only abnormal results are displayed) Labs Reviewed  CBG MONITORING, ED - Abnormal; Notable for the following components:      Result Value   Glucose-Capillary 105 (*)    All other components within normal limits  CBC WITH  DIFFERENTIAL/PLATELET  COMPREHENSIVE METABOLIC PANEL  VITAMIN X52    EKG   Radiology No results found.  Procedures Procedures (including critical care time)  Medications Ordered in UC Medications - No data to display  Initial Impression / Assessment and Plan / UC Course  I have reviewed the triage vital signs and the nursing notes.  Pertinent labs & imaging results that were available during my care of the patient were reviewed by me and considered in my medical decision making (see chart for details).  Patient is here for burning sensation of mouth and tongue since having covid.  She was told to come here today by her pcp.  Her exam is normal today.  No swelling or evidence of infection.  Dicussed this could be due to covid, viral deficiency, or other etiology.   She requested a blood sugar, which was normal.  She admits to being anxious about this which may be contributing to some of her symptoms.  She will f/u with her pcp on Thursday for further discussion.  Basic labs drawn today as well.    Final Clinical Impressions(s) / UC Diagnoses   Final diagnoses:  Burning sensation of mouth  Tongue burning sensation  Nausea without vomiting     Discharge Instructions      You were seen today for burning mouth and tongue, as well as feeling off.  You exam is overall normal today.  Nothing urgent or concerning.  Your blood sugar was normal at 105.   As discussed,  this could be due to covid infection, vitamin deficiency, or other etiology.  I have done basic blood work today to check this.  These will be resulted by tomorrow via my chart.   You should follow up with your primary care provider on Thursday as discussed as well for further work up and treatment if needed.     ED Prescriptions   None    PDMP not reviewed this encounter.   Rondel Oh, MD 01/05/22 1228

## 2022-01-08 ENCOUNTER — Other Ambulatory Visit: Payer: Self-pay

## 2022-01-08 ENCOUNTER — Ambulatory Visit: Payer: No Typology Code available for payment source | Admitting: Family Medicine

## 2022-01-08 ENCOUNTER — Ambulatory Visit (INDEPENDENT_AMBULATORY_CARE_PROVIDER_SITE_OTHER): Payer: No Typology Code available for payment source | Admitting: Family Medicine

## 2022-01-08 VITALS — BP 124/70 | HR 86 | Temp 98.8°F | Resp 16 | Ht 64.75 in | Wt 218.0 lb

## 2022-01-08 DIAGNOSIS — K149 Disease of tongue, unspecified: Secondary | ICD-10-CM

## 2022-01-08 DIAGNOSIS — G8929 Other chronic pain: Secondary | ICD-10-CM

## 2022-01-08 DIAGNOSIS — F411 Generalized anxiety disorder: Secondary | ICD-10-CM | POA: Diagnosis not present

## 2022-01-08 MED ORDER — LORAZEPAM 0.5 MG PO TABS
0.5000 mg | ORAL_TABLET | Freq: Two times a day (BID) | ORAL | 0 refills | Status: DC | PRN
Start: 2022-01-08 — End: 2022-11-16

## 2022-01-08 NOTE — Progress Notes (Signed)
Patient ID: Lacey Decker, female    DOB: [birth year], 51 y.o.   MRN: [id number]  This visit was conducted in person.  BP 124/70    Pulse 86    Temp 98.8 F (37.1 C)    Resp 16    Ht 5' 4.75" (1.645 m)    Wt 218 lb (98.9 kg)    SpO2 98%    BMI 36.56 kg/m    CC:  Chief Complaint  Patient presents with   ER follow up- Burning sensation in mouth    Subjective:   HPI: Lacey Decker is a 51 y.o. female presenting on 01/08/2022 for ER follow up- Burning sensation in mouth  Reviewed ED note from  01/05/22 Noted tounge burning post COVID ( had COVID in January)  Tounge burning had been going on 6 weeks... worse in last 2 weeks.  Labs eval normal. B12 300 Cbc normal.  She brushes teeth more if more anxious 5-9 times a day when anxious.  She has receeding gums... has noted some gum bleeding. No new tooth paste.   Has noted color change of tip of tounge.   She reports  she is very anxious.. she is is biting her cheeks.  She cannot sleep due to pain.  6 weeks of PT... left leg and ip better, but right leg and hip pain is very painful.  Ortho also gave hip injection and given tizanadine.. she is scared to take it.   SE to gabapentin.     Relevant past medical, surgical, family and social history reviewed and updated as indicated. Interim medical history since our last visit reviewed. Allergies and medications reviewed and updated. Outpatient Medications Prior to Visit  Medication Sig Dispense Refill   fluticasone (FLONASE) 50 MCG/ACT nasal spray USE 2 SPRAYS IN EACH  NOSTRIL DAILY 48 g 3   LORazepam (ATIVAN) 0.5 MG tablet Take 1 tablet (0.5 mg total) by mouth 2 (two) times daily as needed for anxiety. 10 tablet 0   omeprazole (PRILOSEC) 40 MG capsule TAKE 1 CAPSULE BY MOUTH  TWICE DAILY BEFORE A MEAL 180 capsule 3   ipratropium (ATROVENT) 0.03 % nasal spray Place 2 sprays into both nostrils every 12 (twelve) hours. 30 mL 0   No facility-administered medications prior to visit.      Per HPI unless specifically indicated in ROS section below Review of Systems  Constitutional:  Negative for fatigue and fever.  HENT:  Negative for congestion.   Eyes:  Negative for pain.  Respiratory:  Negative for cough and shortness of breath.   Cardiovascular:  Negative for chest pain, palpitations and leg swelling.  Gastrointestinal:  Negative for abdominal pain.  Genitourinary:  Negative for dysuria and vaginal bleeding.  Musculoskeletal:  Negative for back pain.  Neurological:  Negative for syncope, light-headedness and headaches.  Psychiatric/Behavioral:  Positive for agitation. Negative for dysphoric mood.   Objective:  BP 124/70    Pulse 86    Temp 98.8 F (37.1 C)    Resp 16    Ht 5' 4.75" (1.645 m)    Wt 218 lb (98.9 kg)    SpO2 98%    BMI 36.56 kg/m   Wt Readings from Last 3 Encounters:  01/08/22 218 lb (98.9 kg)  09/02/21 214 lb (97.1 kg)  07/02/21 218 lb 4 oz (99 kg)      Physical Exam Constitutional:      General: She is not in acute distress.    Appearance: Normal  appearance. She is well-developed. She is not ill-appearing or toxic-appearing.  HENT:     Head: Normocephalic.     Right Ear: Hearing, tympanic membrane, ear canal and external ear normal. Tympanic membrane is not erythematous, retracted or bulging.     Left Ear: Hearing, tympanic membrane, ear canal and external ear normal. Tympanic membrane is not erythematous, retracted or bulging.     Nose: No mucosal edema or rhinorrhea.     Right Sinus: No maxillary sinus tenderness or frontal sinus tenderness.     Left Sinus: No maxillary sinus tenderness or frontal sinus tenderness.     Mouth/Throat:     Lips: Pink.     Mouth: Mucous membranes are moist. Injury present.     Dentition: Gingival swelling present.     Tongue: Lesions present.     Pharynx: Uvula midline.      Comments: Bite marks bilateral buccal mucosa   She has area of redness on tip of tounge where it appear there are denuded taste  buds   No exudate Eyes:     General: Lids are normal. Lids are everted, no foreign bodies appreciated.     Conjunctiva/sclera: Conjunctivae normal.     Pupils: Pupils are equal, round, and reactive to light.  Neck:     Thyroid: No thyroid mass or thyromegaly.     Vascular: No carotid bruit.     Trachea: Trachea normal.  Cardiovascular:     Rate and Rhythm: Normal rate and regular rhythm.     Pulses: Normal pulses.     Heart sounds: Normal heart sounds, S1 normal and S2 normal. No murmur heard.   No friction rub. No gallop.  Pulmonary:     Effort: Pulmonary effort is normal. No tachypnea or respiratory distress.     Breath sounds: Normal breath sounds. No decreased breath sounds, wheezing, rhonchi or rales.  Abdominal:     General: Bowel sounds are normal.     Palpations: Abdomen is soft.     Tenderness: There is no abdominal tenderness.  Musculoskeletal:     Cervical back: Normal range of motion and neck supple.  Skin:    General: Skin is warm and dry.     Findings: No rash.  Neurological:     Mental Status: She is alert.  Psychiatric:        Mood and Affect: Mood is not anxious or depressed.        Speech: Speech normal.        Behavior: Behavior normal. Behavior is cooperative.        Thought Content: Thought content normal.        Judgment: Judgment normal.      Results for orders placed or performed during the hospital encounter of 01/05/22  CBC with Differential  Result Value Ref Range   WBC 8.2 4.0 - 10.5 K/uL   RBC 4.30 3.87 - 5.11 MIL/uL   Hemoglobin 13.9 12.0 - 15.0 g/dL   HCT 42.1 36.0 - 46.0 %   MCV 97.9 80.0 - 100.0 fL   MCH 32.3 26.0 - 34.0 pg   MCHC 33.0 30.0 - 36.0 g/dL   RDW 13.2 11.5 - 15.5 %   Platelets 373 150 - 400 K/uL   nRBC 0.0 0.0 - 0.2 %   Neutrophils Relative % 69 %   Neutro Abs 5.7 1.7 - 7.7 K/uL   Lymphocytes Relative 22 %   Lymphs Abs 1.8 0.7 - 4.0 K/uL   Monocytes Relative 7 %  Monocytes Absolute 0.6 0.1 - 1.0 K/uL   Eosinophils  Relative 1 %   Eosinophils Absolute 0.1 0.0 - 0.5 K/uL   Basophils Relative 1 %   Basophils Absolute 0.1 0.0 - 0.1 K/uL   Immature Granulocytes 0 %   Abs Immature Granulocytes 0.02 0.00 - 0.07 K/uL  Comprehensive metabolic panel  Result Value Ref Range   Sodium 140 135 - 145 mmol/L   Potassium 4.8 3.5 - 5.1 mmol/L   Chloride 103 98 - 111 mmol/L   CO2 27 22 - 32 mmol/L   Glucose, Bld 116 (H) 70 - 99 mg/dL   BUN 27 (H) 6 - 20 mg/dL   Creatinine, Ser 0.88 0.44 - 1.00 mg/dL   Calcium 9.7 8.9 - 10.3 mg/dL   Total Protein 7.4 6.5 - 8.1 g/dL   Albumin 4.3 3.5 - 5.0 g/dL   AST 16 15 - 41 U/L   ALT 20 0 - 44 U/L   Alkaline Phosphatase 66 38 - 126 U/L   Total Bilirubin 0.3 0.3 - 1.2 mg/dL   GFR, Estimated >60 >60 mL/min   Anion gap 10 5 - 15  Vitamin B12  Result Value Ref Range   Vitamin B-12 300 180 - 914 pg/mL  POC CBG monitoring  Result Value Ref Range   Glucose-Capillary 105 (H) 70 - 99 mg/dL    This visit occurred during the SARS-CoV-2 public health emergency.  Safety protocols were in place, including screening questions prior to the visit, additional usage of staff PPE, and extensive cleaning of exam room while observing appropriate contact time as indicated for disinfecting solutions.   COVID 19 screen:  No recent travel or known exposure to COVID19 The patient denies respiratory symptoms of COVID 19 at this time. The importance of social distancing was discussed today.   Assessment and Plan Problem List Items Addressed This Visit     Generalized anxiety disorder - Primary    Chronic poor control  She is hesitant about daily medication given past side effects.  We will continue to use the lorazepam on a limited basis.  She is currently seeing a Social worker.      Relevant Medications   LORazepam (ATIVAN) 0.5 MG tablet   Other chronic pain    Her poorly controlled pain is also contributing to her anxiety.  She is currently managed by orthopedics.  She is status post  physical therapy and multiple steroid injections.      Tongue irritation    Acute, inadequately controlled  She is brushing her teeth 8-9 times a day ( she does it when she is feeling anxious) and the irritation from the toothpaste and brushing may be contributing to damage to her taste buds and because of a burning sensation.  She also recently had COVID which may have affected her taste buds.  There is no current infection seen. The tongue irritation may also be an expression of her poorly controlled anxiety.        Eliezer Lofts, MD

## 2022-01-08 NOTE — Patient Instructions (Addendum)
Try tizanidine at night for muscle spasm. ? Try positive thinking skills as discussed. ? Decrease to twice daily brushing.. sensitive tooth paste.  ? Can use ativan as needed, limit as able. ? ? ? ? ?

## 2022-01-09 DIAGNOSIS — G8929 Other chronic pain: Secondary | ICD-10-CM | POA: Insufficient documentation

## 2022-01-09 DIAGNOSIS — K149 Disease of tongue, unspecified: Secondary | ICD-10-CM | POA: Insufficient documentation

## 2022-01-09 NOTE — Assessment & Plan Note (Signed)
Acute, inadequately controlled ? ?She is brushing her teeth 8-9 times a day ( she does it when she is feeling anxious) and the irritation from the toothpaste and brushing may be contributing to damage to her taste buds and because of a burning sensation.  She also recently had COVID which may have affected her taste buds.  There is no current infection seen. ?The tongue irritation may also be an expression of her poorly controlled anxiety. ?

## 2022-01-09 NOTE — Assessment & Plan Note (Signed)
Chronic poor control ? ?She is hesitant about daily medication given past side effects.  We will continue to use the lorazepam on a limited basis.  She is currently seeing a Social worker. ?

## 2022-01-09 NOTE — Assessment & Plan Note (Signed)
Her poorly controlled pain is also contributing to her anxiety.  She is currently managed by orthopedics.  She is status post physical therapy and multiple steroid injections. ?

## 2022-01-30 ENCOUNTER — Other Ambulatory Visit: Payer: Self-pay

## 2022-01-30 ENCOUNTER — Encounter: Payer: Self-pay | Admitting: Nurse Practitioner

## 2022-01-30 ENCOUNTER — Telehealth (INDEPENDENT_AMBULATORY_CARE_PROVIDER_SITE_OTHER): Payer: No Typology Code available for payment source | Admitting: Nurse Practitioner

## 2022-01-30 DIAGNOSIS — J069 Acute upper respiratory infection, unspecified: Secondary | ICD-10-CM | POA: Insufficient documentation

## 2022-01-30 DIAGNOSIS — R051 Acute cough: Secondary | ICD-10-CM | POA: Diagnosis not present

## 2022-01-30 MED ORDER — BENZONATATE 200 MG PO CAPS
200.0000 mg | ORAL_CAPSULE | Freq: Three times a day (TID) | ORAL | 0 refills | Status: AC | PRN
Start: 2022-01-30 — End: 2022-02-09

## 2022-01-30 MED ORDER — PROMETHAZINE-DM 6.25-15 MG/5ML PO SYRP: 5.0000 mL | ORAL_SOLUTION | Freq: Every evening | ORAL | 0 refills | Status: AC | PRN

## 2022-01-30 NOTE — Assessment & Plan Note (Signed)
Patient's largest complaint is for cough.  We will place her on Mucinex to help thin out mucus.  Along with Tessalon Perles 200 mg 3 times daily as needed.  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  Did discuss signs and symptoms when to be seen urgent or emergently.  Follow-up if no improvement did tell patient to expect course to improve in 5 to 7 days ?

## 2022-01-30 NOTE — Progress Notes (Signed)
? ? Patient ID: Lacey Decker, female    DOB: 1971-01-21, 51 y.o.   MRN: [id number] ? ?Virtual visit completed through [employer], a video enabled telemedicine application. Due to national recommendations of social distancing due to COVID-19, a virtual visit is felt to be most appropriate for this patient at this time. Reviewed limitations, risks, security and privacy concerns of performing a virtual visit and the availability of in person appointments. I also reviewed that there may be a patient responsible charge related to this service. The patient agreed to proceed.  ? ?Patient location: home ?Provider location: Financial controller at [facility], office ?Persons participating in this virtual visit: patient, provider  ? ?If any vitals were documented, they were collected by patient at home unless specified below.   ? ?There were no vitals taken for this visit.  ? ?CC: Cough ?Subjective:  ? ?HPI: ?Lacey Decker is a 51 y.o. female presenting on 01/30/2022 for Cough (And sore throat for the past 3 days. Chest hurting from coughing so much, slight sneezing. All symptoms seem to be in the chest. No fever. No post nasal drip. No covid test was done. Has used cough drops. Has taking Dayquil.) ? ? ?Symptoms started on Tuesday ?Covid test not done. ?Covid vaccines neg ?Grandaughter has been sick. Mom and granddaughter ? ?Has been using dyquill ( no help), cough drops, honey and hot tea. ? ?Relevant past medical, surgical, family and social history reviewed and updated as indicated. Interim medical history since our last visit reviewed. ?Allergies and medications reviewed and updated. ?Outpatient Medications Prior to Visit  ?Medication Sig Dispense Refill  ? fluticasone (FLONASE) 50 MCG/ACT nasal spray USE 2 SPRAYS IN EACH  NOSTRIL DAILY 48 g 3  ? LORazepam (ATIVAN) 0.5 MG tablet Take 1 tablet (0.5 mg total) by mouth 2 (two) times daily as needed for anxiety. 10 tablet 0  ? omeprazole (PRILOSEC) 40 MG capsule TAKE 1 CAPSULE BY  MOUTH  TWICE DAILY BEFORE A MEAL (Patient taking differently: TAKE 1 CAPSULE BY MOUTH  ONCE DAILY BEFORE A MEAL) 180 capsule 3  ? ?No facility-administered medications prior to visit.  ?  ? ?Per HPI unless specifically indicated in ROS section below ?Review of Systems  ?Constitutional:  Positive for appetite change and fatigue. Negative for chills and fever.  ?HENT:  Positive for sore throat. Negative for congestion, ear discharge, ear pain, postnasal drip, sinus pressure and sinus pain.   ?Respiratory:  Positive for cough (dry and some production with green prodcution). Negative for shortness of breath.   ?Cardiovascular:  Positive for chest pain.  ?Gastrointestinal:  Positive for diarrhea and nausea. Negative for abdominal pain and vomiting.  ?Musculoskeletal:  Negative for arthralgias and myalgias.  ?Neurological:  Negative for headaches.  ?Objective:  ?There were no vitals taken for this visit.  ?Wt Readings from Last 3 Encounters:  ?01/08/22 218 lb (98.9 kg)  ?09/02/21 214 lb (97.1 kg)  ?07/02/21 218 lb 4 oz (99 kg)  ?  ?  ? ?Physical exam: ?Gen: alert, NAD, not ill appearing ?Pulm: speaks in complete sentences without increased work of breathing ?Psych: normal mood, normal thought content  ? ?   ?Results for orders placed or performed during the hospital encounter of 01/05/22  ?CBC with Differential  ?Result Value Ref Range  ? WBC 8.2 4.0 - 10.5 K/uL  ? RBC 4.30 3.87 - 5.11 MIL/uL  ? Hemoglobin 13.9 12.0 - 15.0 g/dL  ? HCT 42.1 36.0 - 46.0 %  ?  MCV 97.9 80.0 - 100.0 fL  ? MCH 32.3 26.0 - 34.0 pg  ? MCHC 33.0 30.0 - 36.0 g/dL  ? RDW 13.2 11.5 - 15.5 %  ? Platelets 373 150 - 400 K/uL  ? nRBC 0.0 0.0 - 0.2 %  ? Neutrophils Relative % 69 %  ? Neutro Abs 5.7 1.7 - 7.7 K/uL  ? Lymphocytes Relative 22 %  ? Lymphs Abs 1.8 0.7 - 4.0 K/uL  ? Monocytes Relative 7 %  ? Monocytes Absolute 0.6 0.1 - 1.0 K/uL  ? Eosinophils Relative 1 %  ? Eosinophils Absolute 0.1 0.0 - 0.5 K/uL  ? Basophils Relative 1 %  ? Basophils  Absolute 0.1 0.0 - 0.1 K/uL  ? Immature Granulocytes 0 %  ? Abs Immature Granulocytes 0.02 0.00 - 0.07 K/uL  ?Comprehensive metabolic panel  ?Result Value Ref Range  ? Sodium 140 135 - 145 mmol/L  ? Potassium 4.8 3.5 - 5.1 mmol/L  ? Chloride 103 98 - 111 mmol/L  ? CO2 27 22 - 32 mmol/L  ? Glucose, Bld 116 (H) 70 - 99 mg/dL  ? BUN 27 (H) 6 - 20 mg/dL  ? Creatinine, Ser 0.88 0.44 - 1.00 mg/dL  ? Calcium 9.7 8.9 - 10.3 mg/dL  ? Total Protein 7.4 6.5 - 8.1 g/dL  ? Albumin 4.3 3.5 - 5.0 g/dL  ? AST 16 15 - 41 U/L  ? ALT 20 0 - 44 U/L  ? Alkaline Phosphatase 66 38 - 126 U/L  ? Total Bilirubin 0.3 0.3 - 1.2 mg/dL  ? GFR, Estimated >60 >60 mL/min  ? Anion gap 10 5 - 15  ?Vitamin B12  ?Result Value Ref Range  ? Vitamin B-12 300 180 - 914 pg/mL  ?POC CBG monitoring  ?Result Value Ref Range  ? Glucose-Capillary 105 (H) 70 - 99 mg/dL  ? ?Assessment & Plan:  ? ?Problem List Items Addressed This Visit   ? ?  ? Respiratory  ? Viral upper respiratory tract infection - Primary  ?  Patient symptoms likely viral in nature.  States she did get it from watching her grandchildren.  States her grandchildren have been seen at the pediatrician and tested for flu, COVID, and RSV all came back negative.  Patient has not tested for COVID but states that the grandkids and her kids all have been elevated been negative.  Continue symptomatic treatment inclusive of plenty of fluid, over-the-counter analgesics as needed, and Mucinex. ?  ?  ?  ? Other  ? Acute cough  ?  Patient's largest complaint is for cough.  We will place her on Mucinex to help thin out mucus.  Along with Tessalon Perles 200 mg 3 times daily as needed.  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  Did discuss signs and symptoms when to be seen urgent or emergently.  Follow-up if no improvement did tell patient to expect  course to improve in 5 to 7 days ?  ?  ? Relevant Medications  ? benzonatate (TESSALON) 200 MG capsule  ? promethazine-dextromethorphan (PROMETHAZINE-DM) 6.25-15 MG/5ML syrup  ?  ? ?Meds ordered this encounter  ?Medications  ? benzonatate (TESSALON) 200 MG capsule  ?  Sig: Take 1 capsule (200 mg total) by mouth 3 (three) times daily as needed for up to 10 days for cough.  ?  Dispense:  30  capsule  ?  Refill:  0  ?  Order Specific Question:   Supervising Provider  ?  Answer:   Loura Pardon A [[id number]]  ? promethazine-dextromethorphan (PROMETHAZINE-DM) 6.25-15 MG/5ML syrup  ?  Sig: Take 5 mLs by mouth at bedtime as needed for up to 10 days for cough.  ?  Dispense:  50 mL  ?  Refill:  0  ?  Order Specific Question:   Supervising Provider  ?  Answer:   Loura Pardon A [[id number]]  ? ?No orders of the defined types were placed in this encounter. ? ? ?I discussed the assessment and treatment plan with the patient. The patient was provided an opportunity to ask questions and all were answered. The patient agreed with the plan and demonstrated an understanding of the instructions. The patient was advised to call back or seek an in-person evaluation if the symptoms worsen or if the condition fails to improve as anticipated. ? ?Follow up plan: ?No follow-ups on file. ? ?Romilda Garret, NP   ?

## 2022-01-30 NOTE — Assessment & Plan Note (Signed)
Patient symptoms likely viral in nature.  States she did get it from watching her grandchildren.  States her grandchildren have been seen at the pediatrician and tested for flu, COVID, and RSV all came back negative.  Patient has not tested for COVID but states that the grandkids and her kids all have been elevated been negative.  Continue symptomatic treatment inclusive of plenty of fluid, over-the-counter analgesics as needed, and Mucinex. ?

## 2022-02-11 ENCOUNTER — Ambulatory Visit: Payer: No Typology Code available for payment source | Admitting: Dermatology

## 2022-02-27 ENCOUNTER — Telehealth: Payer: Self-pay

## 2022-02-27 NOTE — Telephone Encounter (Signed)
Patient is requesting referral to ENT. Patient reports smelling ashes for over the past week. Please advise? Patient would like a call to see if she needs to see therm or if she can be seen here.

## 2022-02-27 NOTE — Telephone Encounter (Signed)
Given it has only been ongoing for a week I would suggest her starting with an appointment here in our office.  I know I have availability next week.  In the meantime she can start nasal saline spray 2 times daily or irrigation once daily. ?

## 2022-02-27 NOTE — Telephone Encounter (Signed)
Maniya notified as instructed by telephone.  Appointment scheduled with Dr. Diona Browner 03/03/22 at 11:20 am. ?

## 2022-03-03 ENCOUNTER — Ambulatory Visit: Payer: No Typology Code available for payment source | Admitting: Family Medicine

## 2022-03-05 ENCOUNTER — Ambulatory Visit (INDEPENDENT_AMBULATORY_CARE_PROVIDER_SITE_OTHER): Payer: No Typology Code available for payment source | Admitting: Dermatology

## 2022-03-05 DIAGNOSIS — D23112 Other benign neoplasm of skin of right lower eyelid, including canthus: Secondary | ICD-10-CM | POA: Diagnosis not present

## 2022-03-05 DIAGNOSIS — Z85828 Personal history of other malignant neoplasm of skin: Secondary | ICD-10-CM

## 2022-03-05 DIAGNOSIS — L578 Other skin changes due to chronic exposure to nonionizing radiation: Secondary | ICD-10-CM | POA: Diagnosis not present

## 2022-03-05 DIAGNOSIS — L821 Other seborrheic keratosis: Secondary | ICD-10-CM

## 2022-03-05 DIAGNOSIS — D23122 Other benign neoplasm of skin of left lower eyelid, including canthus: Secondary | ICD-10-CM | POA: Diagnosis not present

## 2022-03-05 DIAGNOSIS — D2310 Other benign neoplasm of skin of unspecified eyelid, including canthus: Secondary | ICD-10-CM

## 2022-03-05 DIAGNOSIS — L814 Other melanin hyperpigmentation: Secondary | ICD-10-CM

## 2022-03-05 NOTE — Patient Instructions (Addendum)
Recommend keeping follow up with primary care concerning smells.  ?This is not related to Mohs Surgery.  ? ? ?For Surgery at Nose  ?Mohs Surgery can take some time to heal. It is normal to feel some sensations as skin begins to heal.  ?Can contact skin surgeon if questions about surgery. ? ? ? ?If You Need Anything After Your Visit ? ?If you have any questions or concerns for your doctor, please call our main line at [phone] and press option 4 to reach your doctor's medical assistant. If no one answers, please leave a voicemail as directed and we will return your call as soon as possible. Messages left after 4 pm will be answered the following business day.  ? ?You may also send [place] a message via MyChart. We typically respond to MyChart messages within 1-2 business days. ? ?For prescription refills, please ask your pharmacy to contact our office. Our fax number is [phone]. ? ?If you have an urgent issue when the clinic is closed that cannot wait until the next business day, you can page your doctor at the number below.   ? ?Please note that while we do our best to be available for urgent issues outside of office hours, we are not available 24/7.  ? ?If you have an urgent issue and are unable to reach [place], you may choose to seek medical care at your doctor's office, retail clinic, urgent care center, or emergency room. ? ?If you have a medical emergency, please immediately call 911 or go to the emergency department. ? ?Pager Numbers ? ?- Dr. Nehemiah Massed: [phone] ? ?- Dr. Laurence Ferrari: [phone] ? ?- Dr. Nicole Kindred: [phone] ? ?In the event of inclement weather, please call our main line at [phone] for an update on the status of any delays or closures. ? ?Dermatology Medication Tips: ?Please keep the boxes that topical medications come in in order to help keep track of the instructions about where and how to use these. Pharmacies typically print the medication instructions only on the boxes and not directly  on the medication tubes.  ? ?If your medication is too expensive, please contact our office at [phone] option 4 or send [place] a message through [place].  ? ?We are unable to tell what your co-pay for medications will be in advance as this is different depending on your insurance coverage. However, we may be able to find a substitute medication at lower cost or fill out paperwork to get insurance to cover a needed medication.  ? ?If a prior authorization is required to get your medication covered by your insurance company, please allow [place] 1-2 business days to complete this process. ? ?Drug prices often vary depending on where the prescription is filled and some pharmacies may offer cheaper prices. ? ?The website www.goodrx.com contains coupons for medications through different pharmacies. The prices here do not account for what the cost may be with help from insurance (it may be cheaper with your insurance), but the website can give you the price if you did not use any insurance.  ?- You can print the associated coupon and take it with your prescription to the pharmacy.  ?- You may also stop by our office during regular business hours and pick up a GoodRx coupon card.  ?- If you need your prescription sent electronically to a different pharmacy, notify our office through [facility] or by phone at [phone] option 4. ? ? ? ? ?Si Usted Necesita Algo Despu?s de Su  Visita ? ?Tambi?n puede enviarnos un mensaje a trav?s de MyChart. Por lo general respondemos a los mensajes de MyChart en el transcurso de 1 a 2 d?as h?biles. ? ?Para renovar recetas, por favor pida a su farmacia que se ponga en contacto con nuestra oficina. Nuestro n?mero de fax es el [phone]. ? ?Si tiene un asunto urgente cuando la cl?nica est? cerrada y que no puede esperar hasta el siguiente d?a h?bil, puede llamar/localizar a su doctor(a) al n?mero que aparece a continuaci?n.  ? ?Por favor, tenga en cuenta que aunque hacemos todo lo  posible para estar disponibles para asuntos urgentes fuera del horario de oficina, no estamos disponibles las 24 horas del d?a, los 7 d?as de la semana.  ? ?Si tiene un problema urgente y no puede comunicarse con nosotros, puede optar por buscar atenci?n m?dica  en el consultorio de su doctor(a), en una cl?nica privada, en un centro de atenci?n urgente o en una sala de emergencias. ? ?Si tiene Engineer, maintenance (IT) m?dica, por favor llame inmediatamente al 911 o vaya a la sala de emergencias. ? ?N?meros de b?per ? ?- Dr. Nehemiah Massed: [phone] ? ?- Dra. Moye: [phone] ? ?- Dra. Nicole Kindred: [phone] ? ?En caso de inclemencias del tiempo, por favor llame a nuestra l?nea principal al [phone] para una actualizaci?n sobre el estado de cualquier retraso o cierre. ? ?Consejos para la medicaci?n en dermatolog?a: ?Por favor, guarde las cajas en las que vienen los medicamentos de uso t?pico para ayudarle a seguir las instrucciones sobre d?nde y c?mo usarlos. Las farmacias generalmente imprimen las instrucciones del medicamento s?lo en las cajas y no directamente en los tubos del [place].  ? ?Si su medicamento es muy caro, por favor, p?ngase en contacto con Zigmund Daniel llamando al [phone] y presione la opci?n 4 o env?enos un mensaje a trav?s de MyChart.  ? ?No podemos decirle cu?l ser? su copago por los medicamentos por adelantado ya que esto es diferente dependiendo de la cobertura de su seguro. Sin embargo, es posible que podamos encontrar un medicamento sustituto a Electrical engineer un formulario para que el seguro cubra el medicamento que se considera necesario.  ? ?Si se requiere Ardelia Mems autorizaci?n previa para que su compa??a de seguros [place] su medicamento, por favor perm?tanos de 1 a 2 d?as h?biles para completar este proceso. ? ?Los precios de los medicamentos var?an con frecuencia dependiendo del Environmental consultant de d?nde se surte la receta y alguna farmacias pueden ofrecer precios m?s baratos. ? ?El sitio web  www.goodrx.com tiene cupones para medicamentos de Airline pilot. Los precios aqu? no tienen en cuenta lo que podr?a costar con la ayuda del seguro (puede ser m?s barato con su seguro), pero el sitio web puede darle el precio si no utiliz? ning?n seguro.  ?- Puede imprimir el cup?n correspondiente y llevarlo con su receta a la farmacia.  ?- Tambi?n puede pasar por nuestra oficina durante el horario de atenci?n regular y recoger una tarjeta de cupones de GoodRx.  ?- Si necesita que su receta se env?e electr?nicamente a Chiropodist, informe a nuestra oficina a trav?s de MyChart de [place] o por tel?fono llamando al [phone] y presione la opci?n 4. ? ?

## 2022-03-05 NOTE — Progress Notes (Signed)
? ?Follow-Up Visit ?  ?Subjective  ?Lacey Decker is a 51 y.o. female who presents for the following: Recheck BCC site (R nasal sidewall - patient c/o pressure sensation when water hits area while showering, or when she touches location. She is very concerned that she has been smelling the scent of ashes for the past two weeks. She was sick the entire months and thinks that it may be related to her sense of smell, but she also read about brain tumors and other malignant conditions and is very concerned). ?The patient has spots, moles and lesions to be evaluated, some may be new or changing and the patient has concerns that these could be cancer. ? ?The following portions of the chart were reviewed this encounter and updated as appropriate:  ? Tobacco  Allergies  Meds  Problems  Med Hx  Surg Hx  Fam Hx   ?  ?Illness such as sinus infection can affect smell and sometimes take a little while to return  ?Review of Systems:  No other skin or systemic complaints except as noted in HPI or Assessment and Plan. ? ?Objective  ?Well appearing patient in no apparent distress; mood and affect are within normal limits. ? ?A focused examination was performed including the face. Relevant physical exam findings are noted in the Assessment and Plan. ? ?R nasal sidewall ?Well healed scar. ? ?B/L lower eyelid ?Flesh colored papules ? ? ?Assessment & Plan  ?History of basal cell carcinoma of skin ?R nasal sidewall ? ?- No evidence of recurrence today ?- Recommend regular full body skin exams ?- Recommend daily broad spectrum sunscreen SPF 30+ to sun-exposed areas, reapply every 2 hours as needed.  ?- Call if any new or changing lesions are noted between office visits ? ?Patient complains of having pressure sensation when water hits area when showering and when touching area.  ? ?Discussed pressure sensations are normal and due to surgery with nerve damage and can take time to heal. If patient has further questions recommend  following up with Mohs surgeon.  ? ?Patient reports smelling ashes and believes is related to Mohs surgery.  Patient reports hx of brief viral illness recently.  ?Reassured patient that Mohs surgery of Right nose sidewall is not likely related to smell but would more likely be related to recent brief viral illness.  ?Recommend following up with Primary Care for further evaluation and treatment if still bothered by smell.  ? ?Syringoma of eyelid, unspecified laterality ?B/L lower eyelids ?Benign-appearing.  Observation.  Call clinic for new or changing lesions.  Recommend daily use of broad spectrum spf 30+ sunscreen to sun-exposed areas.  ? ?Do not recommend treatment , can leave a scar if surgically removed and does not respond well to cryotherapy or other treatments. ? ?Actinic Damage ?- chronic, secondary to cumulative UV radiation exposure/sun exposure over time ?- diffuse scaly erythematous macules with underlying dyspigmentation ?- Recommend daily broad spectrum sunscreen SPF 30+ to sun-exposed areas, reapply every 2 hours as needed.  ?- Recommend staying in the shade or wearing long sleeves, sun glasses (UVA+UVB protection) and wide brim hats (4-inch brim around the entire circumference of the hat). ?- Call for new or changing lesions. ? ?Seborrheic Keratoses ?- Stuck-on, waxy, tan-brown papules and/or plaques  ?- Benign-appearing ?- Discussed benign etiology and prognosis. ?- Observe ?- Call for any changes ? ?Lentigines ?- Scattered tan macules ?- Due to sun exposure ?- Benign-appering, observe ?- Recommend daily broad spectrum sunscreen SPF 30+ to  sun-exposed areas, reapply every 2 hours as needed. ?- Call for any changes ? ?Return in about 6 months (around 09/04/2022) for TBSE . ? ?I, Rudell Cobb, CMA, am acting as scribe for Sarina Ser, MD . ?Documentation: I have reviewed the above documentation for accuracy and completeness, and I agree with the above. ? ?Sarina Ser, MD ? ?

## 2022-03-10 ENCOUNTER — Ambulatory Visit: Payer: No Typology Code available for payment source | Admitting: Family Medicine

## 2022-03-13 ENCOUNTER — Encounter: Payer: Self-pay | Admitting: Family Medicine

## 2022-03-13 ENCOUNTER — Ambulatory Visit (INDEPENDENT_AMBULATORY_CARE_PROVIDER_SITE_OTHER): Payer: No Typology Code available for payment source | Admitting: Family Medicine

## 2022-03-13 VITALS — BP 110/70 | HR 68 | Temp 98.6°F | Ht 64.75 in | Wt 211.1 lb

## 2022-03-13 DIAGNOSIS — R431 Parosmia: Secondary | ICD-10-CM | POA: Diagnosis not present

## 2022-03-13 NOTE — Progress Notes (Signed)
? ? Patient ID: Lacey Decker, female    DOB: 1971/06/11, 51 y.o.   MRN: [id number] ? ?This visit was conducted in person. ? ?BP 110/70   Pulse 68   Temp 98.6 ?F (37 ?C) (Oral)   Ht 5' 4.75" (1.645 m)   Wt 211 lb 2 oz (95.8 kg)   SpO2 98%   BMI 35.40 kg/m?   ? ?CC: ?Chief Complaint  ?Patient presents with  ? Smell Issue  ?  Smells Ashes on and off during the day  ? ? ?Subjective:  ? ?HPI: ?Lacey Decker is a 51 y.o. female  with bipolar disorder and hypochondriasis presenting on 03/13/2022 for Smell Issue (Smells Ashes on and off during the day) ? ? She has noted in the last  month.. intermittently she notes the smell of ashes.  Only notes a t house, one time did note out. Improves with pushing on upper portion of nose. ? ? Nasal dryness, no congestion, no fever, no sneeze, no ear pain, no fever ?She looked on Internet.. saw concern for nasal polyp, brain tumor, alzheimer's ? ? In March, she notes she was very ill.. negative COVID test. No toher risidual symptoms. ?  ?Cleaned house with bleach. ? ? No new headache, nausea, vomiting. ? No change in vision. No seizures, no starting spell ? ? No change with nasal saline  2-3 spray  per day in last 1 week. ? ?   ? ?Relevant past medical, surgical, family and social history reviewed and updated as indicated. Interim medical history since our last visit reviewed. ?Allergies and medications reviewed and updated. ?Outpatient Medications Prior to Visit  ?Medication Sig Dispense Refill  ? fluticasone (FLONASE) 50 MCG/ACT nasal spray Place into both nostrils daily as needed for allergies or rhinitis.    ? LORazepam (ATIVAN) 0.5 MG tablet Take 1 tablet (0.5 mg total) by mouth 2 (two) times daily as needed for anxiety. 10 tablet 0  ? omeprazole (PRILOSEC) 20 MG capsule Take 20 mg by mouth 2 (two) times daily as needed.    ? traMADol (ULTRAM) 50 MG tablet Take 50 mg by mouth every 6 (six) hours as needed.    ? fluticasone (FLONASE) 50 MCG/ACT nasal spray USE 2 SPRAYS IN EACH   NOSTRIL DAILY 48 g 3  ? omeprazole (PRILOSEC) 40 MG capsule TAKE 1 CAPSULE BY MOUTH  TWICE DAILY BEFORE A MEAL (Patient taking differently: TAKE 1 CAPSULE BY MOUTH  ONCE DAILY BEFORE A MEAL) 180 capsule 3  ? ?No facility-administered medications prior to visit.  ?  ? ?Per HPI unless specifically indicated in ROS section below ?Review of Systems  ?Constitutional:  Negative for fatigue and fever.  ?HENT:  Negative for congestion.   ?Eyes:  Negative for pain.  ?Respiratory:  Negative for cough and shortness of breath.   ?Cardiovascular:  Negative for chest pain, palpitations and leg swelling.  ?Gastrointestinal:  Negative for abdominal pain.  ?Genitourinary:  Negative for dysuria and vaginal bleeding.  ?Musculoskeletal:  Negative for back pain.  ?Neurological:  Negative for syncope, light-headedness and headaches.  ?Psychiatric/Behavioral:  Negative for dysphoric mood.   ?Objective:  ?BP 110/70   Pulse 68   Temp 98.6 ?F (37 ?C) (Oral)   Ht 5' 4.75" (1.645 m)   Wt 211 lb 2 oz (95.8 kg)   SpO2 98%   BMI 35.40 kg/m?   ?Wt Readings from Last 3 Encounters:  ?03/13/22 211 lb 2 oz (95.8 kg)  ?01/08/22 218 lb (98.9  kg)  ?09/02/21 214 lb (97.1 kg)  ?  ?  ?Physical Exam ?Constitutional:   ?   General: She is not in acute distress. ?   Appearance: Normal appearance. She is well-developed. She is not ill-appearing or toxic-appearing.  ?HENT:  ?   Head: Normocephalic.  ?   Right Ear: Hearing, tympanic membrane, ear canal and external ear normal. Tympanic membrane is not erythematous, retracted or bulging.  ?   Left Ear: Hearing, tympanic membrane, ear canal and external ear normal. Tympanic membrane is not erythematous, retracted or bulging.  ?   Nose: No mucosal edema or rhinorrhea.  ?   Right Sinus: No maxillary sinus tenderness or frontal sinus tenderness.  ?   Left Sinus: No maxillary sinus tenderness or frontal sinus tenderness.  ?   Mouth/Throat:  ?   Pharynx: Uvula midline.  ?Eyes:  ?   General: Lids are normal. Lids  are everted, no foreign bodies appreciated.  ?   Conjunctiva/sclera: Conjunctivae normal.  ?   Pupils: Pupils are equal, round, and reactive to light.  ?Neck:  ?   Thyroid: No thyroid mass or thyromegaly.  ?   Vascular: No carotid bruit.  ?   Trachea: Trachea normal.  ?Cardiovascular:  ?   Rate and Rhythm: Normal rate and regular rhythm.  ?   Pulses: Normal pulses.  ?   Heart sounds: Normal heart sounds, S1 normal and S2 normal. No murmur heard. ?  No friction rub. No gallop.  ?Pulmonary:  ?   Effort: Pulmonary effort is normal. No tachypnea or respiratory distress.  ?   Breath sounds: Normal breath sounds. No decreased breath sounds, wheezing, rhonchi or rales.  ?Abdominal:  ?   General: Bowel sounds are normal.  ?   Palpations: Abdomen is soft.  ?   Tenderness: There is no abdominal tenderness.  ?Musculoskeletal:  ?   Cervical back: Normal range of motion and neck supple.  ?Skin: ?   General: Skin is warm and dry.  ?   Findings: No rash.  ?Neurological:  ?   Mental Status: She is alert.  ?Psychiatric:     ?   Mood and Affect: Mood is not anxious or depressed.     ?   Speech: Speech normal.     ?   Behavior: Behavior normal. Behavior is cooperative.     ?   Thought Content: Thought content normal.     ?   Judgment: Judgment normal.  ? ?   ?Results for orders placed or performed during the hospital encounter of 01/05/22  ?CBC with Differential  ?Result Value Ref Range  ? WBC 8.2 4.0 - 10.5 K/uL  ? RBC 4.30 3.87 - 5.11 MIL/uL  ? Hemoglobin 13.9 12.0 - 15.0 g/dL  ? HCT 42.1 36.0 - 46.0 %  ? MCV 97.9 80.0 - 100.0 fL  ? MCH 32.3 26.0 - 34.0 pg  ? MCHC 33.0 30.0 - 36.0 g/dL  ? RDW 13.2 11.5 - 15.5 %  ? Platelets 373 150 - 400 K/uL  ? nRBC 0.0 0.0 - 0.2 %  ? Neutrophils Relative % 69 %  ? Neutro Abs 5.7 1.7 - 7.7 K/uL  ? Lymphocytes Relative 22 %  ? Lymphs Abs 1.8 0.7 - 4.0 K/uL  ? Monocytes Relative 7 %  ? Monocytes Absolute 0.6 0.1 - 1.0 K/uL  ? Eosinophils Relative 1 %  ? Eosinophils Absolute 0.1 0.0 - 0.5 K/uL  ?  Basophils Relative 1 %  ?  Basophils Absolute 0.1 0.0 - 0.1 K/uL  ? Immature Granulocytes 0 %  ? Abs Immature Granulocytes 0.02 0.00 - 0.07 K/uL  ?Comprehensive metabolic panel  ?Result Value Ref Range  ? Sodium 140 135 - 145 mmol/L  ? Potassium 4.8 3.5 - 5.1 mmol/L  ? Chloride 103 98 - 111 mmol/L  ? CO2 27 22 - 32 mmol/L  ? Glucose, Bld 116 (H) 70 - 99 mg/dL  ? BUN 27 (H) 6 - 20 mg/dL  ? Creatinine, Ser 0.88 0.44 - 1.00 mg/dL  ? Calcium 9.7 8.9 - 10.3 mg/dL  ? Total Protein 7.4 6.5 - 8.1 g/dL  ? Albumin 4.3 3.5 - 5.0 g/dL  ? AST 16 15 - 41 U/L  ? ALT 20 0 - 44 U/L  ? Alkaline Phosphatase 66 38 - 126 U/L  ? Total Bilirubin 0.3 0.3 - 1.2 mg/dL  ? GFR, Estimated >60 >60 mL/min  ? Anion gap 10 5 - 15  ?Vitamin B12  ?Result Value Ref Range  ? Vitamin B-12 300 180 - 914 pg/mL  ?POC CBG monitoring  ?Result Value Ref Range  ? Glucose-Capillary 105 (H) 70 - 99 mg/dL  ? ? ? ?COVID 19 screen:  No recent travel or known exposure to [place] ?The patient denies respiratory symptoms of COVID 19 at this time. ?The importance of social distancing was discussed today.  ? ?Assessment and Plan ?Problem List Items Addressed This Visit   ? ? Abnormal smell - Primary  ?  Acute, poor control ? ?Given this started following viral upper respiratory tract infection I believe this is most likely the cause.  On exam I see no sign of nasal polyp or other nasal issue.  Symptoms did not improve with nasal saline. ? ?Neuro exam is normal and she has no other signs or symptoms of brain tumor versus seizure disorder.  No red flags. ? ?We will give this time to resolve but if it is not we will consider referral to ENT for nasal evaluation.  If any additional symptoms start or if it is not improving following ENT exam we will consider less likely neurologic source. ? ?  ?  ? ? ?  ? ?Eliezer Lofts, MD  ? ?

## 2022-03-13 NOTE — Patient Instructions (Signed)
Call if abnormal smell not improving in 1 month.. for referral to ENT. ? ?

## 2022-03-13 NOTE — Assessment & Plan Note (Signed)
Acute, poor control ? ?Given this started following viral upper respiratory tract infection I believe this is most likely the cause.  On exam I see no sign of nasal polyp or other nasal issue.  Symptoms did not improve with nasal saline. ? ?Neuro exam is normal and she has no other signs or symptoms of brain tumor versus seizure disorder.  No red flags. ? ?We will give this time to resolve but if it is not we will consider referral to ENT for nasal evaluation.  If any additional symptoms start or if it is not improving following ENT exam we will consider less likely neurologic source. ?

## 2022-03-17 ENCOUNTER — Encounter: Payer: Self-pay | Admitting: Dermatology

## 2022-04-14 ENCOUNTER — Encounter: Payer: Self-pay | Admitting: Family

## 2022-04-14 ENCOUNTER — Ambulatory Visit (INDEPENDENT_AMBULATORY_CARE_PROVIDER_SITE_OTHER): Payer: No Typology Code available for payment source | Admitting: Family

## 2022-04-14 VITALS — BP 118/64 | HR 73 | Temp 98.7°F | Resp 16 | Ht 64.75 in | Wt 209.2 lb

## 2022-04-14 DIAGNOSIS — J3489 Other specified disorders of nose and nasal sinuses: Secondary | ICD-10-CM | POA: Diagnosis not present

## 2022-04-14 DIAGNOSIS — J020 Streptococcal pharyngitis: Secondary | ICD-10-CM | POA: Diagnosis not present

## 2022-04-14 DIAGNOSIS — R431 Parosmia: Secondary | ICD-10-CM

## 2022-04-14 DIAGNOSIS — J029 Acute pharyngitis, unspecified: Secondary | ICD-10-CM

## 2022-04-14 MED ORDER — PREDNISONE 20 MG PO TABS: ORAL_TABLET | ORAL | 0 refills | Status: DC

## 2022-04-14 MED ORDER — CEFDINIR 300 MG PO CAPS: 300.0000 mg | ORAL_CAPSULE | Freq: Two times a day (BID) | ORAL | 0 refills | Status: AC

## 2022-04-14 NOTE — Patient Instructions (Addendum)
Try the neti pot to see if you can cleanse your sinuses.  Daily flonase and nightly zyrtec.   You were found to be strep positive,  Take antibiotics that have been sent to the pharmacy.  Change your toothbrush after 24 hours on the antibiotics.  Gargle with warm salt water as needed for sore throat.   Due to recent changes in healthcare laws, you may see results of your imaging and/or laboratory studies on MyChart before I have had a chance to review them.  I understand that in some cases there may be results that are confusing or concerning to you. Please understand that not all results are received at the same time and often I may need to interpret multiple results in order to provide you with the best plan of care or course of treatment. Therefore, I ask that you please give me 2 business days to thoroughly review all your results before contacting my office for clarification. Should we see a critical lab result, you will be contacted sooner.   It was a pleasure seeing you today! Please do not hesitate to reach out with any questions and or concerns.  Regards,   Eugenia Pancoast FNP-C

## 2022-04-14 NOTE — Assessment & Plan Note (Signed)
Strep tested in office, positive Warm salt water gargles   

## 2022-04-14 NOTE — Assessment & Plan Note (Signed)
rx prednisone 20 mg taper  daily flonase Nightly zyrtec

## 2022-04-14 NOTE — Progress Notes (Signed)
Established Patient Office Visit  Subjective:  Patient ID: Lacey Decker, female    DOB: [birth year]  Age: 51 y.o. MRN: [id number]  CC:  Chief Complaint  Patient presents with   Sore Throat    X 1 week    HPI Lacey Decker is here today with concerns.   Five days ago started with sore throat. Also with nasal congestion.  With sinus pressure. No ear pain.  No fever or chills.  No chest congestion.  Dry cough. Sx have been for the last six days.  Three family members tested positive for strep.   Allergic rhinitis: just recently started flonase daily for the last two weeks.  Nightly zyrtec.   Past Medical History:  Diagnosis Date   Adenomyosis    Alcohol abuse    last drink one year ago   Anxiety    Basal cell carcinoma 08/05/2021   R nasal sidewall, MOHs completed 09/23/21   Bipolar 1 disorder ([place])    Chlamydia    as a teen   Dysplastic nevus 01/13/2010   left lat sacral, sacral, 0.4 x 0.3cm - DYSPLASTIC DYSPLASTIC JUNCTIONAL JUNCTIONAL NEVUS WITH MILD MELANOCYTIC MELANOCYTIC ATYPIA   Dysplastic nevus 01/13/2010   left ant axillary fold 0.3 cm - dysplastic compound nevus with mild melanocytic atypia   Endometriosis    GERD (gastroesophageal reflux disease)    MRSA (methicillin resistant staph aureus) culture positive    PMDD (premenstrual dysphoric disorder)    SUI (stress urinary incontinence, female)     Past Surgical History:  Procedure Laterality Date   ABDOMINAL HYSTERECTOMY  2009   total lap hyst, LSO due to adenomyosis/endometriosis   btl  11/09/1998   COLONOSCOPY WITH PROPOFOL N/A 01/10/2020   Procedure: COLONOSCOPY WITH PROPOFOL;  Surgeon: Lin Landsman, MD;  Location: [facility];  Service: Gastroenterology;  Laterality: N/A;   DILATION AND CURETTAGE OF UTERUS  1997   after miscarriage   ESOPHAGOGASTRODUODENOSCOPY (EGD) WITH PROPOFOL N/A 01/10/2020   Procedure: ESOPHAGOGASTRODUODENOSCOPY (EGD) WITH PROPOFOL;  Surgeon: Lin Landsman, MD;   Location: [facility];  Service: Gastroenterology;  Laterality: N/A;   LEEP  2000   misscarriage     x5   NEUROMA SURGERY     lipoma removal Right labia   nsvd     3    Family History  Problem Relation Age of Onset   Hypertension Mother    Hyperlipidemia Mother    Depression Mother    Basal cell carcinoma Mother    Colon polyps Mother    Bipolar disorder Sister    Depression Maternal Grandmother    Lung cancer Maternal Grandfather    Breast cancer Neg Hx     Social History   Socioeconomic History   Marital status: Single    Spouse name: Not on file   Number of children: 3   Years of education: Not on file   Highest education level: Not on file  Occupational History    Employer: [employer]  Tobacco Use   Smoking status: Never   Smokeless tobacco: Never  Vaping Use   Vaping Use: Never used  Substance and Sexual Activity   Alcohol use: No    Alcohol/week: 0.0 standard drinks   Drug use: No   Sexual activity: Not Currently    Birth control/protection: Surgical    Comment: Hysterectomy  Other Topics Concern   Not on file  Social History Narrative   Not on file   Social Determinants of  Health   Financial Resource Strain: Not on file  Food Insecurity: Not on file  Transportation Needs: Not on file  Physical Activity: Not on file  Stress: Not on file  Social Connections: Not on file  Intimate Partner Violence: Not on file    Outpatient Medications Prior to Visit  Medication Sig Dispense Refill   fluticasone (FLONASE) 50 MCG/ACT nasal spray Place into both nostrils daily as needed for allergies or rhinitis.     LORazepam (ATIVAN) 0.5 MG tablet Take 1 tablet (0.5 mg total) by mouth 2 (two) times daily as needed for anxiety. 10 tablet 0   omeprazole (PRILOSEC) 20 MG capsule Take 20 mg by mouth 2 (two) times daily as needed.     traMADol (ULTRAM) 50 MG tablet Take 50 mg by mouth every 6 (six) hours as needed.     No facility-administered medications prior to  visit.    Allergies  Allergen Reactions   Codeine     REACTION: anaphylactic shock   Morphine     REACTION: swelling   Clarithromycin     REACTION: Trouble breathing - stomach upset - bad taste in mouth   Erythromycin Base Other (See Comments)    Eye ointment - worsened conjunctivitis and swelling   Sulfamethoxazole-Trimethoprim Hives    REACTION: N' \\T'$ \ V, rash   Augmentin [Amoxicillin-Pot Clavulanate] Rash        Objective:    Physical Exam Constitutional:      General: She is not in acute distress.    Appearance: Normal appearance. She is not ill-appearing.  HENT:     Right Ear: Tympanic membrane normal.     Left Ear: Tympanic membrane normal.     Nose: Mucosal edema and congestion present. No rhinorrhea.     Right Turbinates: Not enlarged or swollen.     Left Turbinates: Not enlarged or swollen.     Right Sinus: Frontal sinus tenderness present. No maxillary sinus tenderness.     Left Sinus: Frontal sinus tenderness present. No maxillary sinus tenderness.     Mouth/Throat:     Mouth: Mucous membranes are moist.     Pharynx: Posterior oropharyngeal erythema present. No pharyngeal swelling or oropharyngeal exudate.     Tonsils: No tonsillar exudate.  Eyes:     Extraocular Movements: Extraocular movements intact.     Conjunctiva/sclera: Conjunctivae normal.     Pupils: Pupils are equal, round, and reactive to light.  Neck:     Thyroid: No thyroid mass.  Cardiovascular:     Rate and Rhythm: Normal rate and regular rhythm.  Pulmonary:     Effort: Pulmonary effort is normal.     Breath sounds: Normal breath sounds.  Lymphadenopathy:     Cervical:     Right cervical: No superficial cervical adenopathy.    Left cervical: No superficial cervical adenopathy.  Neurological:     Mental Status: She is alert.    BP 118/64   Pulse 73   Temp 98.7 F (37.1 C)   Resp 16   Ht 5' 4.75" (1.645 m)   Wt 209 lb 4 oz (94.9 kg)   SpO2 98%   BMI 35.09 kg/m  Wt Readings  from Last 3 Encounters:  04/14/22 209 lb 4 oz (94.9 kg)  03/13/22 211 lb 2 oz (95.8 kg)  01/08/22 218 lb (98.9 kg)     Health Maintenance Due  Topic Date Due   Zoster Vaccines- Shingrix (2 of 2) 06/17/2021    There are no preventive  care reminders to display for this patient.  Lab Results  Component Value Date   TSH 0.88 11/19/2020   Lab Results  Component Value Date   WBC 8.2 01/05/2022   HGB 13.9 01/05/2022   HCT 42.1 01/05/2022   MCV 97.9 01/05/2022   PLT 373 01/05/2022   Lab Results  Component Value Date   NA 140 01/05/2022   K 4.8 01/05/2022   CO2 27 01/05/2022   GLUCOSE 116 (H) 01/05/2022   BUN 27 (H) 01/05/2022   CREATININE 0.88 01/05/2022   BILITOT 0.3 01/05/2022   ALKPHOS 66 01/05/2022   AST 16 01/05/2022   ALT 20 01/05/2022   PROT 7.4 01/05/2022   ALBUMIN 4.3 01/05/2022   CALCIUM 9.7 01/05/2022   ANIONGAP 10 01/05/2022   GFR 84.75 04/17/2021   Lab Results  Component Value Date   HGBA1C 6.0 04/17/2021      Assessment & Plan:   Problem List Items Addressed This Visit       Respiratory   Strep pharyngitis    Strep tested positive in office.  rx cefdinir trial, choosing this over amox because pt also with sinusitis type symptoms Advised if any rash to begin stop med, take benadryl and let me know immediately.  Ibuprofen/tyelnol prn sore throat/fever Pt told to F/u if no improvement in the next 2-3 days.       Relevant Medications   cefdinir (OMNICEF) 300 MG capsule     Other   Abnormal smell    Suspected due to chronic rhinosinusitis and under treated allergies.  Start daily flonase and zyrtec Treating sinusitis  If no improvement in three weeks consider ENT referral        Sore throat - Primary    Strep tested in office, positive  Warm salt water gargles         Relevant Medications   cefdinir (OMNICEF) 300 MG capsule   Other Relevant Orders   POCT rapid strep A   Sinus pressure    rx prednisone 20 mg taper  daily  flonase Nightly zyrtec        Relevant Medications   predniSONE (DELTASONE) 20 MG tablet    Meds ordered this encounter  Medications   cefdinir (OMNICEF) 300 MG capsule    Sig: Take 1 capsule (300 mg total) by mouth 2 (two) times daily for 10 days.    Dispense:  20 capsule    Refill:  0    Order Specific Question:   Supervising Provider    Answer:   BEDSOLE, AMY E [[id number]]   predniSONE (DELTASONE) 20 MG tablet    Sig: Take two tablets daily for 3 days followed by one tablet daily for 4 days    Dispense:  10 tablet    Refill:  0    Order Specific Question:   Supervising Provider    Answer:   Diona Browner, AMY E [[id number]]    Follow-up: Return in about 3 weeks (around 05/05/2022) for follow up on chronic allergies .    Eugenia Pancoast, FNP

## 2022-04-14 NOTE — Assessment & Plan Note (Signed)
Suspected due to chronic rhinosinusitis and under treated allergies.  Start daily flonase and zyrtec Treating sinusitis  If no improvement in three weeks consider ENT referral

## 2022-04-14 NOTE — Assessment & Plan Note (Signed)
Strep tested positive in office.  rx cefdinir trial, choosing this over amox because pt also with sinusitis type symptoms Advised if any rash to begin stop med, take benadryl and let me know immediately.  Ibuprofen/tyelnol prn sore throat/fever Pt told to F/u if no improvement in the next 2-3 days.

## 2022-04-28 ENCOUNTER — Emergency Department
Admission: EM | Admit: 2022-04-28 | Discharge: 2022-04-28 | Disposition: A | Discharge status: Home / Self Care | Payer: No Typology Code available for payment source | Attending: Emergency Medicine | Admitting: Emergency Medicine

## 2022-04-28 ENCOUNTER — Other Ambulatory Visit: Payer: Self-pay

## 2022-04-28 ENCOUNTER — Emergency Department: Payer: No Typology Code available for payment source

## 2022-04-28 DIAGNOSIS — R42 Dizziness and giddiness: Secondary | ICD-10-CM | POA: Diagnosis present

## 2022-04-28 DIAGNOSIS — J019 Acute sinusitis, unspecified: Secondary | ICD-10-CM | POA: Insufficient documentation

## 2022-04-28 LAB — URINALYSIS, ROUTINE W REFLEX MICROSCOPIC
Bilirubin Urine: NEGATIVE
Glucose, UA: NEGATIVE mg/dL
Hgb urine dipstick: NEGATIVE
Ketones, ur: NEGATIVE mg/dL
Leukocytes,Ua: NEGATIVE
Nitrite: NEGATIVE
Protein, ur: NEGATIVE mg/dL
Specific Gravity, Urine: 1.012 (ref 1.005–1.030)
pH: 5 (ref 5.0–8.0)

## 2022-04-28 LAB — CBC
HCT: 40.6 % (ref 36.0–46.0)
Hemoglobin: 13.1 g/dL (ref 12.0–15.0)
MCH: 32 pg (ref 26.0–34.0)
MCHC: 32.3 g/dL (ref 30.0–36.0)
MCV: 99.3 fL (ref 80.0–100.0)
Platelets: 312 10*3/uL (ref 150–400)
RBC: 4.09 MIL/uL (ref 3.87–5.11)
RDW: 13.1 % (ref 11.5–15.5)
WBC: 8.7 10*3/uL (ref 4.0–10.5)
nRBC: 0 % (ref 0.0–0.2)

## 2022-04-28 LAB — BASIC METABOLIC PANEL
Anion gap: 7 (ref 5–15)
BUN: 23 mg/dL — ABNORMAL HIGH (ref 6–20)
CO2: 26 mmol/L (ref 22–32)
Calcium: 9.3 mg/dL (ref 8.9–10.3)
Chloride: 107 mmol/L (ref 98–111)
Creatinine, Ser: 0.88 mg/dL (ref 0.44–1.00)
GFR, Estimated: >60 mL/min (ref >60)
Glucose, Bld: 140 mg/dL — ABNORMAL HIGH (ref 70–99)
Potassium: 4 mmol/L (ref 3.5–5.1)
Sodium: 140 mmol/L (ref 135–145)

## 2022-04-28 LAB — POC URINE PREG, ED: Preg Test, Ur: NEGATIVE

## 2022-04-28 MED ORDER — ONDANSETRON HCL 4 MG/2ML IJ SOLN
4.0000 mg | Freq: Once | INTRAMUSCULAR | Status: AC
  Administered 2022-04-28: 4 mg via INTRAVENOUS
  Filled 2022-04-28: qty 2

## 2022-04-28 MED ORDER — LORAZEPAM 2 MG/ML IJ SOLN
1.0000 mg | Freq: Once | INTRAMUSCULAR | Status: AC
  Administered 2022-04-28: 1 mg via INTRAVENOUS
  Filled 2022-04-28: qty 1

## 2022-04-28 MED ORDER — AZITHROMYCIN 250 MG PO TABS: ORAL_TABLET | ORAL | 0 refills | Status: AC

## 2022-04-28 MED ORDER — LORAZEPAM 1 MG PO TABS: 1.0000 mg | ORAL_TABLET | Freq: Two times a day (BID) | ORAL | 0 refills | Status: DC

## 2022-04-28 MED ORDER — MECLIZINE HCL 25 MG PO TABS
25.0000 mg | ORAL_TABLET | Freq: Once | ORAL | Status: AC
  Administered 2022-04-28: 25 mg via ORAL
  Filled 2022-04-28: qty 1

## 2022-04-28 MED ORDER — MECLIZINE HCL 25 MG PO TABS
25.0000 mg | ORAL_TABLET | Freq: Three times a day (TID) | ORAL | 0 refills | Status: DC | PRN
Start: 2022-04-28 — End: 2022-08-24

## 2022-04-28 NOTE — ED Notes (Signed)
1 yof lying supine in the bed with her head elevated. The pt is warm, pink, and dry. The pt is alert and oriented x 4. The pt advised she was having dizziness and visibly crying. The pt advised she was upset because she believes she may have something more than a sinus infection, may be MS.

## 2022-04-28 NOTE — Discharge Instructions (Addendum)
Please take the Z-Pak for the sinus infection since you are allergic to amoxicillin and Augmentin now.  Please follow-up with ENT Dr. Tami Ribas if you are not back to norm for 5 days after the Z-Pak is finished.  Make sure you are using salt water nasal spray 5 or 6 times a day as well for your sinus infection or you can use saline rinses as described in the attachment.  Please do not hesitate to return for any worsening or return of vertigo.  Take the Ativan 1 pill twice a day as needed if the vertigo returns mildly or if you have anxiety.  If the vertigo is severe like it was today just return here.  Also take the Antivert for the vertigo. Be careful both the Antivert and the Ativan can make you sleepy and woozy.  Do not drive on either one of them.  If the police catch you you could be considered an impaired driver.

## 2022-04-28 NOTE — ED Provider Notes (Addendum)
[facility] Provider Note    Event Date/Time   First MD Initiated Contact with Patient 04/28/22 [phone]     (approximate)   History   Dizziness and Nausea   HPI  Lacey Decker is a 51 y.o. female patient reports recent strep infection and sinusitis.  Today she got up and became vertiginous with a lot of spinning and nausea.  This happens worse if she moves her head.  Or if she lays back.  She is never had it before.  She reports she is also been smelling smoke for several months.  She is not sure why this is.  Her primary care doctor said she should get an MRI but did not order her 1.     Physical Exam   Triage Vital Signs: ED Triage Vitals  Enc Vitals Group     BP 04/28/22 0918 (!) 142/62     Pulse Rate 04/28/22 0917 78     Resp 04/28/22 0917 18     Temp 04/28/22 0917 97.8 F (36.6 C)     Temp Source 04/28/22 0917 Oral     SpO2 04/28/22 0917 98 %     Weight 04/28/22 0917 209 lb 3.5 oz (94.9 kg)     Height 04/28/22 0917 '5\' 4"'$  (1.626 m)     Head Circumference --      Peak Flow --      Pain Score 04/28/22 0917 0     Pain Loc --      Pain Edu? --      Excl. in [place]? --     Most recent vital signs: Vitals:   04/28/22 0918 04/28/22 1216  BP: (!) 142/62 120/71  Pulse:  81  Resp:  16  Temp:  98.5 F (36.9 C)  SpO2:  99%    General: Awake, no distress.  She is anxious and crying. Head normocephalic atraumatic Eyes pupils equal round reactive extraocular movements intact fundi look normal CV:  Good peripheral perfusion.  Heart regular rate and rhythm no audible murmurs Resp:  Normal effort.  Lungs are clear Abd:  No distention.  Soft and nontender Neuro exam cranial nerves II through XII are intact although visual fields were not checked.  Cerebellar finger-nose and rapid alternating movements in the hands are normal motor strength is 5 or 5 throughout the patient does not report any numbness.  Right leg hip flexors are somewhat weak because of  hip pain.  She is not actually having weakness.   ED Results / Procedures / Treatments   Labs (all labs ordered are listed, but only abnormal results are displayed) Labs Reviewed  BASIC METABOLIC PANEL - Abnormal; Notable for the following components:      Result Value   Glucose, Bld 140 (*)    BUN 23 (*)    All other components within normal limits  URINALYSIS, ROUTINE W REFLEX MICROSCOPIC - Abnormal; Notable for the following components:   Color, Urine STRAW (*)    APPearance CLEAR (*)    All other components within normal limits  CBC  POC URINE PREG, ED  CBG MONITORING, ED     EKG  EKG read and interpreted by me shows normal sinus rhythm rate of 71 normal axis no acute disease   RADIOLOGY MRI read by radiology films reviewed by me showed no acute disease   PROCEDURES:  Critical Care performed:   Procedures   MEDICATIONS ORDERED IN ED: Medications  ondansetron (ZOFRAN) injection 4  mg (4 mg Intravenous Given 04/28/22 1053)  LORazepam (ATIVAN) injection 1 mg (1 mg Intravenous Given 04/28/22 1054)  meclizine (ANTIVERT) tablet 25 mg (25 mg Oral Given 04/28/22 1057)  LORazepam (ATIVAN) injection 1 mg (1 mg Intravenous Given 04/28/22 1227)     IMPRESSION / MDM / ASSESSMENT AND PLAN / ED COURSE  I reviewed the triage vital signs and the nursing notes.  Patient may be having and most likely is having peripheral vertigo however thrust testing is negative for peripheral problems there is not really a lot of nystagmus when I laid her head back.  We will get an MRI just to make sure there is no central cause of her vertigo  Differential diagnosis includes, but is not limited to, stroke brain tumor and MS, labyrinthitis or other middle ear or inner ear disease.  There is no hearing loss or does not sound like [place]'s.  Patient's presentation is most consistent with acute complicated illness / injury requiring diagnostic workup.  The patient is on the cardiac monitor to  evaluate for evidence of arrhythmia and/or significant heart rate changes.  None were seen     FINAL CLINICAL IMPRESSION(S) / ED DIAGNOSES   Final diagnoses:  Subacute sinusitis, unspecified location  Vertigo     Rx / DC Orders   ED Discharge Orders          Ordered    azithromycin (ZITHROMAX Z-PAK) 250 MG tablet        04/28/22 1328    LORazepam (ATIVAN) 1 MG tablet  2 times daily        04/28/22 1421    meclizine (ANTIVERT) 25 MG tablet  3 times daily PRN        04/28/22 1421             Note:  This document was prepared using Dragon voice recognition software and may include unintentional dictation errors.   Nena Polio, MD 04/28/22 1425    Nena Polio, MD 04/28/22 1426

## 2022-04-28 NOTE — ED Notes (Signed)
Pt going to MRI

## 2022-04-28 NOTE — ED Triage Notes (Signed)
Pt here with dizziness and nausea. Pt states when she got up this morning, the room started to spin and made her anxious. Pt states this has never happened before. Pt denies pain but is shaking. Pt just recently finished abx for a strep infx.

## 2022-04-28 NOTE — ED Notes (Signed)
Pt A&O, IV removed, pt given discharge instructions, pt ambulating with steady gait. 

## 2022-04-30 ENCOUNTER — Telehealth (INDEPENDENT_AMBULATORY_CARE_PROVIDER_SITE_OTHER): Payer: No Typology Code available for payment source | Admitting: Family Medicine

## 2022-04-30 ENCOUNTER — Encounter: Payer: Self-pay | Admitting: Family Medicine

## 2022-04-30 VITALS — Ht 64.0 in | Wt 202.0 lb

## 2022-04-30 DIAGNOSIS — R431 Parosmia: Secondary | ICD-10-CM

## 2022-04-30 DIAGNOSIS — J0110 Acute frontal sinusitis, unspecified: Secondary | ICD-10-CM | POA: Diagnosis not present

## 2022-04-30 DIAGNOSIS — R42 Dizziness and giddiness: Secondary | ICD-10-CM | POA: Diagnosis not present

## 2022-04-30 DIAGNOSIS — J329 Chronic sinusitis, unspecified: Secondary | ICD-10-CM | POA: Insufficient documentation

## 2022-04-30 NOTE — Patient Instructions (Signed)
Take the zpak for sinus infection Drink fluids Use meclizine if you get dizzy again  Use flonase daily   Follow up with your regular doctor when she returns   Alert [place] if symptoms worsen or do not improve   See ENT as planned

## 2022-04-30 NOTE — Assessment & Plan Note (Addendum)
Sinus pain/pressure/congestion with purulent nasal mucous Seen in ER  Reviewed hospital records, lab results and studies in detail   Agree with plan for zithromax  Vertigo is better - may have been from ETD Enc pt to start back on flonase  MRI is reassuring, pt is concerned about the T2 white matter change but it was read as normal  Recommended she disc with pcp when she returns Enc her to see ENT as planned

## 2022-04-30 NOTE — Assessment & Plan Note (Addendum)
Pt smells smoke all the time Since April  Has had many uri illnesses from grandchild incl covid in jan  This may explain MRI was reassuring  Plan to tx her current sinus infection and f/u with pcp Has ENT appt planned

## 2022-04-30 NOTE — Assessment & Plan Note (Addendum)
Recent positional dizziness is likely vertigo Reviewed hospital records, lab results and studies in detail  Reassuring MRI Recommend f/u with pcp  Has meclizine for prn use  Plan to treat sinus infection and see if this prevents it from returning Enc her to see ENT as planned

## 2022-04-30 NOTE — Progress Notes (Signed)
Virtual Visit via Video Note  I connected with Lacey Decker on 04/30/22 at 11:30 AM EDT by a video enabled telemedicine application and verified that I am speaking with the correct person using two identifiers.  Location: Patient: home Provider: office   I discussed the limitations of evaluation and management by telemedicine and the availability of in person appointments. The patient expressed understanding and agreed to proceed.  Parties involved in encounter  Patient: Lacey Decker  Provider:  Loura Pardon MD   History of Present Illness:  Pt was seen in ER on 04/28/22 for sinusitis  Had visit with NP here on 6/6 for strep and sinus symptoms  Tx with cefdinir   She notes she was sick whole mo of march with her baby Poss rsv  Started smelling smoke smell in April   Saw Dr Diona Browner for that  Had discussed ENT referral    She presented with some spinning and nausea -worse with head movement  "Smelling smoke" for severe months   Told most likely peripheral vertigo-but neg thrust testing and not a lot of nystagmus  Did MRI  MR BRAIN WO CONTRAST  Result Date: 04/28/2022 CLINICAL DATA:  Dizziness, persistent/recurrent, cardiac or vascular cause suspected EXAM: MRI HEAD WITHOUT CONTRAST TECHNIQUE: Multiplanar, multiecho pulse sequences of the brain and surrounding structures were obtained without intravenous contrast. COMPARISON:  None Available. FINDINGS: Brain: There is no acute infarction or intracranial hemorrhage. There is no intracranial mass, mass effect, or edema. There is no hydrocephalus or extra-axial fluid collection. Ventricles and sulci are normal in size and configuration. Patchy foci of T2 hyperintensity in the supratentorial white matter likely reflect minor burden of nonspecific gliosis/demyelination. Vascular: Major vessel flow voids at the skull base are preserved. Skull and upper cervical spine: Normal marrow signal is preserved. Sinuses/Orbits: Paranasal sinuses  are aerated. Orbits are unremarkable. Other: Sella is unremarkable.  Mastoid air cells are clear. IMPRESSION: No acute infarction, hemorrhage, or mass. Electronically Signed   By: Macy Mis M.D.   On: 04/28/2022 13:13    Patchy foci of T2 hyperintensity in supratentorial white matter - gliossis/demyelination    Was treated for sinusitis with zithromax Meclizine for the dizziness Ativan    Lab Results  Component Value Date   WBC 8.7 04/28/2022   HGB 13.1 04/28/2022   HCT 40.6 04/28/2022   MCV 99.3 04/28/2022   PLT 312 04/28/2022   Lab Results  Component Value Date   CREATININE 0.88 04/28/2022   BUN 23 (H) 04/28/2022   NA 140 04/28/2022   K 4.0 04/28/2022   CL 107 04/28/2022   CO2 26 04/28/2022   Dizziness is improved  Some sinus pain in cheeks and forehead  Yellow /green mucous   Occ cough No fever No ST  Patient Active Problem List   Diagnosis Date Noted   Sinusitis 04/30/2022   Dizziness 04/30/2022   Sore throat 04/14/2022   Strep pharyngitis 04/14/2022   Sinus pressure 04/14/2022   Abnormal smell 03/13/2022   Tongue irritation 01/09/2022   Other chronic pain 01/09/2022   Osteoarthritis of right hip 10/28/2021   Lumbar radiculopathy, acute 09/02/2021   Piriformis syndrome of left side 09/02/2021   Trochanteric bursitis of left hip 09/02/2021   Prediabetes 04/22/2021   BMI 35.0-35.9,adult 04/22/2021   Palpitation 11/19/2020   Gastroesophageal reflux disease with esophagitis without hemorrhage    Esophageal dysphagia    Perimenopausal vasomotor symptoms 08/22/2019   Urinary incontinence without sensory awareness 02/08/2018   PMDD (premenstrual  dysphoric disorder) 02/08/2018   SUI (stress urinary incontinence, female) 02/08/2018   Trapezius strain, left, initial encounter 01/18/2018   Medial epicondylitis of left elbow 07/17/2013   OBESITY, UNSPECIFIED 11/21/2010   Allergic rhinitis 11/21/2010   MIGRAINE, COMMON, INTRACTABLE 01/29/2009   MRSA  11/24/2008   VERTEBROBASILAR INSUFFICIENCY 07/08/2007   Bipolar disorder ([place]) 01/26/2007   Generalized anxiety disorder 01/06/2007   PANIC ATTACKS 01/06/2007   Erosive esophagitis 01/06/2007   DYSFUNCTIONAL UTERINE BLEEDING 01/06/2007   ROSACEA 01/06/2007   Past Medical History:  Diagnosis Date   Adenomyosis    Alcohol abuse    last drink one year ago   Anxiety    Basal cell carcinoma 08/05/2021   R nasal sidewall, MOHs completed 09/23/21   Bipolar 1 disorder ([place])    Chlamydia    as a teen   Dysplastic nevus 01/13/2010   left lat sacral, sacral, 0.4 x 0.3cm - DYSPLASTIC DYSPLASTIC JUNCTIONAL JUNCTIONAL NEVUS WITH MILD MELANOCYTIC MELANOCYTIC ATYPIA   Dysplastic nevus 01/13/2010   left ant axillary fold 0.3 cm - dysplastic compound nevus with mild melanocytic atypia   Endometriosis    GERD (gastroesophageal reflux disease)    MRSA (methicillin resistant staph aureus) culture positive    PMDD (premenstrual dysphoric disorder)    SUI (stress urinary incontinence, female)    Past Surgical History:  Procedure Laterality Date   ABDOMINAL HYSTERECTOMY  2009   total lap hyst, LSO due to adenomyosis/endometriosis   btl  11/09/1998   COLONOSCOPY WITH PROPOFOL N/A 01/10/2020   Procedure: COLONOSCOPY WITH PROPOFOL;  Surgeon: Lin Landsman, MD;  Location: [facility] ENDOSCOPY;  Service: Gastroenterology;  Laterality: N/A;   DILATION AND CURETTAGE OF UTERUS  1997   after miscarriage   ESOPHAGOGASTRODUODENOSCOPY (EGD) WITH PROPOFOL N/A 01/10/2020   Procedure: ESOPHAGOGASTRODUODENOSCOPY (EGD) WITH PROPOFOL;  Surgeon: Lin Landsman, MD;  Location: [place];  Service: Gastroenterology;  Laterality: N/A;   LEEP  2000   misscarriage     x5   NEUROMA SURGERY     lipoma removal Right labia   nsvd     3   Social History   Tobacco Use   Smoking status: Never   Smokeless tobacco: Never  Vaping Use   Vaping Use: Never used  Substance Use Topics   Alcohol use: No     Alcohol/week: 0.0 standard drinks of alcohol   Drug use: No   Family History  Problem Relation Age of Onset   Hypertension Mother    Hyperlipidemia Mother    Depression Mother    Basal cell carcinoma Mother    Colon polyps Mother    Bipolar disorder Sister    Depression Maternal Grandmother    Lung cancer Maternal Grandfather    Breast cancer Neg Hx    Allergies  Allergen Reactions   Codeine     REACTION: anaphylactic shock   Morphine     REACTION: swelling   Clarithromycin     REACTION: Trouble breathing - stomach upset - bad taste in mouth   Erythromycin Base Other (See Comments)    Eye ointment - worsened conjunctivitis and swelling   Sulfamethoxazole-Trimethoprim Hives    REACTION: N' \\T'$ \ V, rash   Augmentin [Amoxicillin-Pot Clavulanate] Rash   Current Outpatient Medications on File Prior to Visit  Medication Sig Dispense Refill   fluticasone (FLONASE) 50 MCG/ACT nasal spray Place into both nostrils daily as needed for allergies or rhinitis.     LORazepam (ATIVAN) 0.5 MG tablet Take 1 tablet (0.5  mg total) by mouth 2 (two) times daily as needed for anxiety. 10 tablet 0   LORazepam (ATIVAN) 1 MG tablet Take 1 tablet (1 mg total) by mouth 2 (two) times daily. Prn vertigo or anxiety 15 tablet 0   meclizine (ANTIVERT) 25 MG tablet Take 1 tablet (25 mg total) by mouth 3 (three) times daily as needed for dizziness. 30 tablet 0   omeprazole (PRILOSEC) 20 MG capsule Take 20 mg by mouth 2 (two) times daily as needed.     traMADol (ULTRAM) 50 MG tablet Take 50 mg by mouth every 6 (six) hours as needed.     azithromycin (ZITHROMAX Z-PAK) 250 MG tablet Take 2 tablets (500 mg) on  Day 1,  followed by 1 tablet (250 mg) once daily on Days 2 through 5. (Patient not taking: Reported on 04/30/2022) 6 each 0   predniSONE (DELTASONE) 20 MG tablet Take two tablets daily for 3 days followed by one tablet daily for 4 days (Patient not taking: Reported on 04/30/2022) 10 tablet 0   No current  facility-administered medications on file prior to visit.   Review of Systems  Constitutional:  Positive for malaise/fatigue. Negative for chills and fever.  HENT:  Positive for congestion and sinus pain. Negative for ear pain and sore throat.   Eyes:  Negative for blurred vision, discharge and redness.  Respiratory:  Negative for cough, shortness of breath and stridor.   Cardiovascular:  Negative for chest pain, palpitations and leg swelling.  Gastrointestinal:  Negative for abdominal pain, diarrhea, nausea and vomiting.  Musculoskeletal:  Negative for myalgias.  Skin:  Negative for rash.  Neurological:  Positive for dizziness and headaches.       Dizziness is improved  Psychiatric/Behavioral:  The patient is nervous/anxious.     Observations/Objective: Patient appears well, in no distress Weight is baseline  No facial swelling or asymmetry Normal voice-not hoarse and no slurred speech Sounds stuffy/congested Does clear her throat  No obvious tremor or mobility impairment Moving neck and UEs normally Able to hear the call well  No cough or shortness of breath during interview  Talkative and mentally sharp with no cognitive changes No skin changes on face or neck , no rash or pallor Affect is anxious/almost tearful at times   Assessment and Plan: Problem List Items Addressed This Visit       Respiratory   Sinusitis - Primary    Sinus pain/pressure/congestion with purulent nasal mucous Seen in ER  Reviewed hospital records, lab results and studies in detail   Agree with plan for zithromax  Vertigo is better - may have been from ETD Enc pt to start back on flonase  MRI is reassuring, pt is concerned about the T2 white matter change but it was read as normal  Recommended she disc with pcp when she returns Enc her to see ENT as planned        Other   Abnormal smell    Pt smells smoke all the time Since April  Has had many uri illnesses from grandchild incl covid in jan   This may explain MRI was reassuring  Plan to tx her current sinus infection and f/u with pcp Has ENT appt planned      Dizziness    Recent positional dizziness is likely vertigo Reviewed hospital records, lab results and studies in detail  Reassuring MRI Recommend f/u with pcp  Has meclizine for prn use  Plan to treat sinus infection and see if this  prevents it from returning Enc her to see ENT as planned          Follow Up Instructions: Take the zpak for sinus infection Drink fluids Use meclizine if you get dizzy again  Use flonase daily   Follow up with your regular doctor when she returns   Alert [place] if symptoms worsen or do not improve   See ENT as planned    I discussed the assessment and treatment plan with the patient. The patient was provided an opportunity to ask questions and all were answered. The patient agreed with the plan and demonstrated an understanding of the instructions.   The patient was advised to call back or seek an in-person evaluation if the symptoms worsen or if the condition fails to improve as anticipated.     Loura Pardon, MD

## 2022-08-17 ENCOUNTER — Telehealth: Payer: Self-pay

## 2022-08-17 NOTE — Telephone Encounter (Signed)
[facility] Night - Client Nonclinical Telephone Record  [facility] Client [facility] Night - Client Client Site [place] - Night Provider Eliezer Lofts - MD Contact Type Call Who Is Calling Patient / Member / Family / Caregiver Caller Name [place] Phone Number [phone] Patient Name Murriel Holwerda Patient DOB 08/13/1971 Call Type Message Only Information Provided Reason for Call Request for General Office Information Initial Comment Caller stated she is needing speak with RN regarding clearance paper work for upcoming surgery. Disp. Time Disposition Final User 08/14/2022 5:05:19 PM General Information Provided Yes Verlan Friends Call Closed By: Verlan Friends Transaction Date/Time: 08/14/2022 5:01:13 PM (ET

## 2022-08-17 NOTE — Telephone Encounter (Signed)
Sending note to [facility] CMA.

## 2022-08-17 NOTE — Telephone Encounter (Signed)
Spoke with [place].  She is going to have a hip replacement and needs a surgical clearance form filled out.  She states the orthopedic office was suppose to fax the clearance form over last week.  I advised her that we have not received any clearance form yet.  She will call them today and give her my direct fax to refax form.  I did go ahead and schedule appointment with Dr. Diona Browner on 08/19/22 at 10:20 to get surgical clearance exam. FYI to Dr. Diona Browner.

## 2022-08-18 NOTE — Telephone Encounter (Signed)
Noted  

## 2022-08-19 ENCOUNTER — Encounter: Payer: Self-pay | Admitting: Family Medicine

## 2022-08-19 ENCOUNTER — Ambulatory Visit (INDEPENDENT_AMBULATORY_CARE_PROVIDER_SITE_OTHER): Payer: No Typology Code available for payment source | Admitting: Family Medicine

## 2022-08-19 VITALS — BP 116/72 | HR 75 | Temp 98.0°F | Ht 64.75 in | Wt 214.4 lb

## 2022-08-19 DIAGNOSIS — Z01818 Encounter for other preprocedural examination: Secondary | ICD-10-CM | POA: Diagnosis not present

## 2022-08-19 DIAGNOSIS — K2210 Ulcer of esophagus without bleeding: Secondary | ICD-10-CM

## 2022-08-19 MED ORDER — OMEPRAZOLE 40 MG PO CPDR: 40.0000 mg | DELAYED_RELEASE_CAPSULE | Freq: Two times a day (BID) | ORAL | 3 refills | Status: DC

## 2022-08-19 NOTE — Progress Notes (Signed)
Patient ID: Lacey Decker, female    DOB: 03/11/1971, 51 y.o.   MRN: [id number]  This visit was conducted in person.  BP 116/72   Pulse 75   Temp 98 F (36.7 C) (Temporal)   Ht 5' 4.75" (1.645 m)   Wt 214 lb 6.4 oz (97.3 kg)   SpO2 96%   BMI 35.95 kg/m    CC:  Chief Complaint  Patient presents with   surgical clearane     "Scratchy throat" and watery eyes X2 days. Requesting biometric screening along with surgical clearance. No forms to fill out.    Subjective:   HPI: Lacey Decker is a 51 y.o. female presenting on 08/19/2022 for surgical clearane  ("Scratchy throat" and watery eyes X2 days. Requesting biometric screening along with surgical clearance. No forms to fill out.)   She has upcoming hip replacement planned. Dr. Micael Hampshire, [facility]   She reports she has had scratchy throat, watery eyes, pressure in face. On Day 2.  She has been working in attic lately... may be having allergies to  dust.  No fever, no body aches.  Last night she started Flonase and antihistamine...  No CP, no SOB.  She is able to  do activity like walking up stairs, 2 flights without SOB.Marland Kitchen she does feel she is out of shape given sitting for last 3 years.   No complications with recent surgeries... local anesthesia. 10 years ago partial hysterectomy no anesthesia complications.    Reviewed 04/28/22 EKG: normal EKG, normal sinus rhythm, unchanged from previous tracings.        Relevant past medical, surgical, family and social history reviewed and updated as indicated. Interim medical history since our last visit reviewed. Allergies and medications reviewed and updated. Outpatient Medications Prior to Visit  Medication Sig Dispense Refill   fluticasone (FLONASE) 50 MCG/ACT nasal spray Place into both nostrils daily as needed for allergies or rhinitis.     LORazepam (ATIVAN) 0.5 MG tablet Take 1 tablet (0.5 mg total) by mouth 2 (two) times daily as needed for anxiety. 10 tablet 0    LORazepam (ATIVAN) 1 MG tablet Take 1 tablet (1 mg total) by mouth 2 (two) times daily. Prn vertigo or anxiety 15 tablet 0   meclizine (ANTIVERT) 25 MG tablet Take 1 tablet (25 mg total) by mouth 3 (three) times daily as needed for dizziness. 30 tablet 0   omeprazole (PRILOSEC) 20 MG capsule Take 20 mg by mouth 2 (two) times daily as needed.     predniSONE (DELTASONE) 20 MG tablet Take two tablets daily for 3 days followed by one tablet daily for 4 days (Patient not taking: Reported on 04/30/2022) 10 tablet 0   traMADol (ULTRAM) 50 MG tablet Take 50 mg by mouth every 6 (six) hours as needed.     No facility-administered medications prior to visit.     Per HPI unless specifically indicated in ROS section below Review of Systems  Constitutional:  Negative for fatigue and fever.  HENT:  Negative for congestion.   Eyes:  Negative for pain.  Respiratory:  Negative for cough and shortness of breath.   Cardiovascular:  Negative for chest pain, palpitations and leg swelling.  Gastrointestinal:  Negative for abdominal pain.  Genitourinary:  Negative for dysuria and vaginal bleeding.  Musculoskeletal:  Negative for back pain.  Neurological:  Negative for syncope, light-headedness and headaches.  Psychiatric/Behavioral:  Negative for dysphoric mood.    Objective:  BP 116/72  Pulse 75   Temp 98 F (36.7 C) (Temporal)   Ht 5' 4.75" (1.645 m)   Wt 214 lb 6.4 oz (97.3 kg)   SpO2 96%   BMI 35.95 kg/m   Wt Readings from Last 3 Encounters:  08/19/22 214 lb 6.4 oz (97.3 kg)  04/30/22 202 lb (91.6 kg)  04/28/22 209 lb 3.5 oz (94.9 kg)      Physical Exam Vitals and nursing note reviewed.  Constitutional:      General: She is not in acute distress.    Appearance: Normal appearance. She is well-developed. She is not ill-appearing or toxic-appearing.  HENT:     Head: Normocephalic.     Right Ear: Hearing, tympanic membrane, ear canal and external ear normal.     Left Ear: Hearing, tympanic  membrane, ear canal and external ear normal.     Nose: Nose normal.  Eyes:     General: Lids are normal. Lids are everted, no foreign bodies appreciated.     Conjunctiva/sclera: Conjunctivae normal.     Pupils: Pupils are equal, round, and reactive to light.  Neck:     Thyroid: No thyroid mass or thyromegaly.     Vascular: No carotid bruit.     Trachea: Trachea normal.  Cardiovascular:     Rate and Rhythm: Normal rate and regular rhythm.     Heart sounds: Normal heart sounds, S1 normal and S2 normal. No murmur heard.    No gallop.  Pulmonary:     Effort: Pulmonary effort is normal. No respiratory distress.     Breath sounds: Normal breath sounds. No wheezing, rhonchi or rales.  Abdominal:     General: Bowel sounds are normal. There is no distension or abdominal bruit.     Palpations: Abdomen is soft. There is no fluid wave or mass.     Tenderness: There is no abdominal tenderness. There is no guarding or rebound.     Hernia: No hernia is present.  Musculoskeletal:     Cervical back: Normal range of motion and neck supple.  Lymphadenopathy:     Cervical: No cervical adenopathy.  Skin:    General: Skin is warm and dry.     Findings: No rash.  Neurological:     Mental Status: She is alert.     Cranial Nerves: No cranial nerve deficit.     Sensory: No sensory deficit.  Psychiatric:        Mood and Affect: Mood is not anxious or depressed.        Speech: Speech normal.        Behavior: Behavior normal. Behavior is cooperative.        Judgment: Judgment normal.       Results for orders placed or performed during the hospital encounter of 16/94/50  Basic metabolic panel  Result Value Ref Range   Sodium 140 135 - 145 mmol/L   Potassium 4.0 3.5 - 5.1 mmol/L   Chloride 107 98 - 111 mmol/L   CO2 26 22 - 32 mmol/L   Glucose, Bld 140 (H) 70 - 99 mg/dL   BUN 23 (H) 6 - 20 mg/dL   Creatinine, Ser 0.88 0.44 - 1.00 mg/dL   Calcium 9.3 8.9 - 10.3 mg/dL   GFR, Estimated >60 >60  mL/min   Anion gap 7 5 - 15  CBC  Result Value Ref Range   WBC 8.7 4.0 - 10.5 K/uL   RBC 4.09 3.87 - 5.11 MIL/uL   Hemoglobin 13.1 12.0 -  15.0 g/dL   HCT 40.6 36.0 - 46.0 %   MCV 99.3 80.0 - 100.0 fL   MCH 32.0 26.0 - 34.0 pg   MCHC 32.3 30.0 - 36.0 g/dL   RDW 13.1 11.5 - 15.5 %   Platelets 312 150 - 400 K/uL   nRBC 0.0 0.0 - 0.2 %  Urinalysis, Routine w reflex microscopic  Result Value Ref Range   Color, Urine STRAW (A) YELLOW   APPearance CLEAR (A) CLEAR   Specific Gravity, Urine 1.012 1.005 - 1.030   pH 5.0 5.0 - 8.0   Glucose, UA NEGATIVE NEGATIVE mg/dL   Hgb urine dipstick NEGATIVE NEGATIVE   Bilirubin Urine NEGATIVE NEGATIVE   Ketones, ur NEGATIVE NEGATIVE mg/dL   Protein, ur NEGATIVE NEGATIVE mg/dL   Nitrite NEGATIVE NEGATIVE   Leukocytes,Ua NEGATIVE NEGATIVE  POC urine preg, ED  Result Value Ref Range   Preg Test, Ur NEGATIVE NEGATIVE     COVID 19 screen:  No recent travel or known exposure to COVID19 The patient denies respiratory symptoms of COVID 19 at this time. The importance of social distancing was discussed today.   Assessment and Plan Problem List Items Addressed This Visit     Erosive esophagitis    Well-controlled on Prilosec 40 mg twice daily.  Refill sent.  Instructed her to continue this before during and after surgery.      Pre-op evaluation - Primary     She will return for fasting labs.  EKG reviewed and unremarkable.  She is able to do 4 or greater METS of activity without any chest pain or shortness of breath.  Further activity is limited by her hip pain. She is low risk for complications for her upcoming total hip replacement.  We reviewed early activity as released by orthopedic as well as incentive spirometry following surgery.  I encouraged her to continue to work on healthy eating during and after surgery to maintain glucose in ideal range.      Relevant Orders   Hemoglobin A1c   Comprehensive metabolic panel   CBC with  Differential/Platelet   Lipid panel     Eliezer Lofts, MD

## 2022-08-19 NOTE — Assessment & Plan Note (Signed)
She will return for fasting labs.  EKG reviewed and unremarkable.  She is able to do 4 or greater METS of activity without any chest pain or shortness of breath.  Further activity is limited by her hip pain. She is low risk for complications for her upcoming total hip replacement.  We reviewed early activity as released by orthopedic as well as incentive spirometry following surgery.  I encouraged her to continue to work on healthy eating during and after surgery to maintain glucose in ideal range.

## 2022-08-19 NOTE — Patient Instructions (Addendum)
Return for fasting labs. Get flu shot at pharmacy.

## 2022-08-19 NOTE — Assessment & Plan Note (Signed)
Well-controlled on Prilosec 40 mg twice daily.  Refill sent.  Instructed her to continue this before during and after surgery.

## 2022-08-24 ENCOUNTER — Encounter: Payer: Self-pay | Admitting: Family Medicine

## 2022-08-24 ENCOUNTER — Telehealth: Payer: Self-pay | Admitting: Family Medicine

## 2022-08-24 ENCOUNTER — Ambulatory Visit (INDEPENDENT_AMBULATORY_CARE_PROVIDER_SITE_OTHER): Payer: No Typology Code available for payment source | Admitting: Family

## 2022-08-24 VITALS — BP 126/70 | Temp 98.6°F | Resp 16 | Ht 64.75 in | Wt 218.2 lb

## 2022-08-24 DIAGNOSIS — J3489 Other specified disorders of nose and nasal sinuses: Secondary | ICD-10-CM

## 2022-08-24 DIAGNOSIS — J020 Streptococcal pharyngitis: Secondary | ICD-10-CM

## 2022-08-24 MED ORDER — AMOXICILLIN 500 MG PO CAPS: 500.0000 mg | ORAL_CAPSULE | Freq: Two times a day (BID) | ORAL | 0 refills | Status: AC

## 2022-08-24 MED ORDER — MUPIROCIN 2 % EX OINT: 1.0000 | TOPICAL_OINTMENT | Freq: Two times a day (BID) | CUTANEOUS | 0 refills | Status: AC

## 2022-08-24 NOTE — Patient Instructions (Signed)
Strep tested positive in office.  rx for amox 500 mg po bid x 10 days.  Ibuprofen/tyelnol prn sore throat/fever Pt told to F/u if no improvement in the next 2-3 days.  Due to recent changes in healthcare laws, you may see results of your imaging and/or laboratory studies on MyChart before I have had a chance to review them.  I understand that in some cases there may be results that are confusing or concerning to you. Please understand that not all results are received at the same time and often I may need to interpret multiple results in order to provide you with the best plan of care or course of treatment. Therefore, I ask that you please give me 2 business days to thoroughly review all your results before contacting my office for clarification. Should we see a critical lab result, you will be contacted sooner.   It was a pleasure seeing you today! Please do not hesitate to reach out with any questions and or concerns.  Regards,   Eugenia Pancoast FNP-C

## 2022-08-24 NOTE — Progress Notes (Signed)
Established Patient Office Visit  Subjective:  Patient ID: Lacey FREIBERGER, female    DOB: 1971/03/02  Age: 51 y.o. MRN: [id number]  CC:  Chief Complaint  Patient presents with   Sore Throat   Sinus Problem    X 1 week    HPI Lacey Decker is here today with concerns.   C/o sinus pressure and sore throat, has been having symptoms for about 5 days . No ear pain. Dry non productive cough without chest congestion. Nose is very dry, and with blood discharge on tissue. No fever or chills but increased fatigue, and falling asleep early.   Took tylenol cold and sinus this am no real relief.   Past Medical History:  Diagnosis Date   Adenomyosis    Alcohol abuse    last drink one year ago   Anxiety    Basal cell carcinoma 08/05/2021   R nasal sidewall, MOHs completed 09/23/21   Bipolar 1 disorder ([place])    Chlamydia    as a teen   Dysplastic nevus 01/13/2010   left lat sacral, sacral, 0.4 x 0.3cm - DYSPLASTIC DYSPLASTIC JUNCTIONAL JUNCTIONAL NEVUS WITH MILD MELANOCYTIC MELANOCYTIC ATYPIA   Dysplastic nevus 01/13/2010   left ant axillary fold 0.3 cm - dysplastic compound nevus with mild melanocytic atypia   Endometriosis    GERD (gastroesophageal reflux disease)    MRSA (methicillin resistant staph aureus) culture positive    PMDD (premenstrual dysphoric disorder)    SUI (stress urinary incontinence, female)     Past Surgical History:  Procedure Laterality Date   ABDOMINAL HYSTERECTOMY  2009   total lap hyst, LSO due to adenomyosis/endometriosis   btl  11/09/1998   COLONOSCOPY WITH PROPOFOL N/A 01/10/2020   Procedure: COLONOSCOPY WITH PROPOFOL;  Surgeon: Lin Landsman, MD;  Location: [facility];  Service: Gastroenterology;  Laterality: N/A;   DILATION AND CURETTAGE OF UTERUS  1997   after miscarriage   ESOPHAGOGASTRODUODENOSCOPY (EGD) WITH PROPOFOL N/A 01/10/2020   Procedure: ESOPHAGOGASTRODUODENOSCOPY (EGD) WITH PROPOFOL;  Surgeon: Lin Landsman, MD;  Location:  [place];  Service: Gastroenterology;  Laterality: N/A;   LEEP  2000   misscarriage     x5   NEUROMA SURGERY     lipoma removal Right labia   nsvd     3    Family History  Problem Relation Age of Onset   Hypertension Mother    Hyperlipidemia Mother    Depression Mother    Basal cell carcinoma Mother    Colon polyps Mother    Bipolar disorder Sister    Depression Maternal Grandmother    Lung cancer Maternal Grandfather    Breast cancer Neg Hx     Social History   Socioeconomic History   Marital status: Single    Spouse name: Not on file   Number of children: 3   Years of education: Not on file   Highest education level: Not on file  Occupational History    Employer: [employer]  Tobacco Use   Smoking status: Never   Smokeless tobacco: Never  Vaping Use   Vaping Use: Never used  Substance and Sexual Activity   Alcohol use: No    Alcohol/week: 0.0 standard drinks of alcohol   Drug use: No   Sexual activity: Not Currently    Birth control/protection: Surgical    Comment: Hysterectomy  Other Topics Concern   Not on file  Social History Narrative   Not on file   Social Determinants  of Health   Financial Resource Strain: Not on file  Food Insecurity: Not on file  Transportation Needs: Not on file  Physical Activity: Not on file  Stress: Not on file  Social Connections: Not on file  Intimate Partner Violence: Not on file    Outpatient Medications Prior to Visit  Medication Sig Dispense Refill   fluticasone (FLONASE) 50 MCG/ACT nasal spray Place into both nostrils daily as needed for allergies or rhinitis.     LORazepam (ATIVAN) 0.5 MG tablet Take 1 tablet (0.5 mg total) by mouth 2 (two) times daily as needed for anxiety. 10 tablet 0   LORazepam (ATIVAN) 1 MG tablet Take 1 tablet (1 mg total) by mouth 2 (two) times daily. Prn vertigo or anxiety 15 tablet 0   omeprazole (PRILOSEC) 40 MG capsule Take 1 capsule (40 mg total) by mouth 2 (two) times daily before  a meal. 180 capsule 3   meclizine (ANTIVERT) 25 MG tablet Take 1 tablet (25 mg total) by mouth 3 (three) times daily as needed for dizziness. (Patient not taking: Reported on 08/24/2022) 30 tablet 0   predniSONE (DELTASONE) 20 MG tablet Take two tablets daily for 3 days followed by one tablet daily for 4 days (Patient not taking: Reported on 08/24/2022) 10 tablet 0   traMADol (ULTRAM) 50 MG tablet Take 50 mg by mouth every 6 (six) hours as needed. (Patient not taking: Reported on 08/24/2022)     No facility-administered medications prior to visit.    Allergies  Allergen Reactions   Codeine     REACTION: anaphylactic shock   Morphine     REACTION: swelling   Clarithromycin     REACTION: Trouble breathing - stomach upset - bad taste in mouth   Erythromycin Base Other (See Comments)    Eye ointment - worsened conjunctivitis and swelling   Sulfamethoxazole-Trimethoprim Hives    REACTION: N' \\T'$ \ V, rash   Augmentin [Amoxicillin-Pot Clavulanate] Rash        Objective:    Physical Exam Constitutional:      General: She is not in acute distress.    Appearance: Normal appearance. She is not ill-appearing.  HENT:     Right Ear: Tympanic membrane normal.     Left Ear: Tympanic membrane normal.     Nose: Nose normal. No congestion or rhinorrhea.     Right Turbinates: Not enlarged or swollen.     Left Turbinates: Not enlarged or swollen.     Right Sinus: No maxillary sinus tenderness or frontal sinus tenderness.     Left Sinus: No maxillary sinus tenderness or frontal sinus tenderness.     Mouth/Throat:     Mouth: Mucous membranes are moist.     Pharynx: No pharyngeal swelling, oropharyngeal exudate or posterior oropharyngeal erythema.     Tonsils: No tonsillar exudate.  Eyes:     Extraocular Movements: Extraocular movements intact.     Conjunctiva/sclera: Conjunctivae normal.     Pupils: Pupils are equal, round, and reactive to light.  Neck:     Thyroid: No thyroid mass.   Cardiovascular:     Rate and Rhythm: Normal rate and regular rhythm.  Pulmonary:     Effort: Pulmonary effort is normal.     Breath sounds: Normal breath sounds.  Lymphadenopathy:     Cervical:     Right cervical: No superficial cervical adenopathy.    Left cervical: No superficial cervical adenopathy.  Neurological:     Mental Status: She is alert.  BP 126/70   Temp 98.6 F (37 C)   Resp 16   Ht 5' 4.75" (1.645 m)   Wt 218 lb 4 oz (99 kg)   BMI 36.60 kg/m  Wt Readings from Last 3 Encounters:  08/24/22 218 lb 4 oz (99 kg)  08/19/22 214 lb 6.4 oz (97.3 kg)  04/30/22 202 lb (91.6 kg)     Health Maintenance Due  Topic Date Due   Zoster Vaccines- Shingrix (2 of 2) 06/17/2021   MAMMOGRAM  05/01/2022   INFLUENZA VACCINE  06/09/2022    There are no preventive care reminders to display for this patient.  Lab Results  Component Value Date   TSH 0.88 11/19/2020   Lab Results  Component Value Date   WBC 8.7 04/28/2022   HGB 13.1 04/28/2022   HCT 40.6 04/28/2022   MCV 99.3 04/28/2022   PLT 312 04/28/2022   Lab Results  Component Value Date   NA 140 04/28/2022   K 4.0 04/28/2022   CO2 26 04/28/2022   GLUCOSE 140 (H) 04/28/2022   BUN 23 (H) 04/28/2022   CREATININE 0.88 04/28/2022   BILITOT 0.3 01/05/2022   ALKPHOS 66 01/05/2022   AST 16 01/05/2022   ALT 20 01/05/2022   PROT 7.4 01/05/2022   ALBUMIN 4.3 01/05/2022   CALCIUM 9.3 04/28/2022   ANIONGAP 7 04/28/2022   GFR 84.75 04/17/2021   Lab Results  Component Value Date   HGBA1C 6.0 04/17/2021      Assessment & Plan:   Problem List Items Addressed This Visit       Respiratory   Strep pharyngitis - Primary    Strep tested positive in office.  rx for amox 500 mg po bid x 10 days.  Ibuprofen/tyelnol prn sore throat/fever Pt told to F/u if no improvement in the next 2-3 days.       Relevant Medications   amoxicillin (AMOXIL) 500 MG capsule   mupirocin ointment (BACTROBAN) 2 %   Other Visit  Diagnoses     Lesion of nose       Relevant Medications   mupirocin ointment (BACTROBAN) 2 %       Meds ordered this encounter  Medications   amoxicillin (AMOXIL) 500 MG capsule    Sig: Take 1 capsule (500 mg total) by mouth 2 (two) times daily for 10 days.    Dispense:  20 capsule    Refill:  0    Can tolerate amoxicillin not augmentin    Order Specific Question:   Supervising Provider    Answer:   BEDSOLE, AMY E [[id number]]   mupirocin ointment (BACTROBAN) 2 %    Sig: Apply 1 Application topically 2 (two) times daily for 10 days.    Dispense:  20 g    Refill:  0    Order Specific Question:   Supervising Provider    Answer:   Diona Browner, AMY E [[id number]]    Follow-up: Return for f/u with primary care provider if no improvement.    Eugenia Pancoast, FNP

## 2022-08-24 NOTE — Assessment & Plan Note (Signed)
Strep tested positive in office.  rx for amox 500 mg po bid x 10 days.  Ibuprofen/tyelnol prn sore throat/fever Pt told to F/u if no improvement in the next 2-3 days.  

## 2022-08-24 NOTE — Telephone Encounter (Signed)
Patient called in and stated that she seen [place] today for an appointment. She stated that on her MyChart in the visit notes it state that she abused alcohol and had a drink a year ago. She stated that isn't true and wants it removed from her notes. Please advise. Thank you!

## 2022-08-26 ENCOUNTER — Encounter: Payer: Self-pay | Admitting: Family Medicine

## 2022-08-26 ENCOUNTER — Other Ambulatory Visit: Payer: No Typology Code available for payment source

## 2022-08-26 NOTE — Telephone Encounter (Signed)
Please advise pt that I have not added this, this was previously in her record hence why it is in medical history, not current active medical problems. This was likely put in over a year ago. I would have pt f/u with her pcp Dr. Diona Browner about this.

## 2022-08-26 NOTE — Telephone Encounter (Signed)
This must have been historical as it is not on my current problem list for her. This was probably added when she established as a patient.  Let her know I will delete it from the Past medical history.

## 2022-08-27 NOTE — Telephone Encounter (Signed)
Lesbia notified as instructed by telephone.

## 2022-09-02 ENCOUNTER — Other Ambulatory Visit: Payer: No Typology Code available for payment source

## 2022-09-07 ENCOUNTER — Other Ambulatory Visit (INDEPENDENT_AMBULATORY_CARE_PROVIDER_SITE_OTHER): Payer: No Typology Code available for payment source

## 2022-09-07 DIAGNOSIS — Z01818 Encounter for other preprocedural examination: Secondary | ICD-10-CM

## 2022-09-07 LAB — CBC WITH DIFFERENTIAL/PLATELET
Basophils Absolute: 0.1 10*3/uL (ref 0.0–0.1)
Basophils Relative: 1.4 % (ref 0.0–3.0)
Eosinophils Absolute: 0.1 10*3/uL (ref 0.0–0.7)
Eosinophils Relative: 1.4 % (ref 0.0–5.0)
HCT: 39.5 % (ref 36.0–46.0)
Hemoglobin: 13 g/dL (ref 12.0–15.0)
Lymphocytes Relative: 20.8 % (ref 12.0–46.0)
Lymphs Abs: 1.6 10*3/uL (ref 0.7–4.0)
MCHC: 33 g/dL (ref 30.0–36.0)
MCV: 97.8 fl (ref 78.0–100.0)
Monocytes Absolute: 0.4 10*3/uL (ref 0.1–1.0)
Monocytes Relative: 5.6 % (ref 3.0–12.0)
Neutro Abs: 5.4 10*3/uL (ref 1.4–7.7)
Neutrophils Relative %: 70.8 % (ref 43.0–77.0)
Platelets: 321 10*3/uL (ref 150.0–400.0)
RBC: 4.04 Mil/uL (ref 3.87–5.11)
RDW: 13.6 % (ref 11.5–15.5)
WBC: 7.6 10*3/uL (ref 4.0–10.5)

## 2022-09-07 LAB — COMPREHENSIVE METABOLIC PANEL
ALT: 16 U/L (ref 0–35)
AST: 19 U/L (ref 0–37)
Albumin: 4.3 g/dL (ref 3.5–5.2)
Alkaline Phosphatase: 67 U/L (ref 39–117)
BUN: 23 mg/dL (ref 6–23)
CO2: 30 mEq/L (ref 19–32)
Calcium: 9.8 mg/dL (ref 8.4–10.5)
Chloride: 103 mEq/L (ref 96–112)
Creatinine, Ser: 0.79 mg/dL (ref 0.40–1.20)
GFR: 85.2 mL/min (ref >60.00)
Glucose, Bld: 96 mg/dL (ref 70–99)
Potassium: 4.5 mEq/L (ref 3.5–5.1)
Sodium: 141 mEq/L (ref 135–145)
Total Bilirubin: 0.4 mg/dL (ref 0.2–1.2)
Total Protein: 6.9 g/dL (ref 6.0–8.3)

## 2022-09-07 LAB — LIPID PANEL
Cholesterol: 155 mg/dL (ref 0–200)
HDL: 51.9 mg/dL (ref >39.00)
LDL Cholesterol: 92 mg/dL (ref 0–99)
NonHDL: 103.22
Total CHOL/HDL Ratio: 3
Triglycerides: 56 mg/dL (ref 0.0–149.0)
VLDL: 11.2 mg/dL (ref 0.0–40.0)

## 2022-09-07 LAB — HEMOGLOBIN A1C: Hgb A1c MFr Bld: 6 % (ref 4.6–6.5)

## 2022-09-09 ENCOUNTER — Ambulatory Visit: Payer: No Typology Code available for payment source | Admitting: Dermatology

## 2022-10-07 ENCOUNTER — Encounter (HOSPITAL_COMMUNITY): Payer: No Typology Code available for payment source

## 2022-10-20 ENCOUNTER — Other Ambulatory Visit: Payer: Self-pay

## 2022-10-20 ENCOUNTER — Emergency Department
Admission: EM | Admit: 2022-10-20 | Discharge: 2022-10-20 | Disposition: A | Discharge status: Home / Self Care | Payer: No Typology Code available for payment source | Attending: Emergency Medicine | Admitting: Emergency Medicine

## 2022-10-20 ENCOUNTER — Encounter: Payer: Self-pay | Admitting: Emergency Medicine

## 2022-10-20 DIAGNOSIS — R059 Cough, unspecified: Secondary | ICD-10-CM | POA: Diagnosis present

## 2022-10-20 DIAGNOSIS — J101 Influenza due to other identified influenza virus with other respiratory manifestations: Secondary | ICD-10-CM | POA: Insufficient documentation

## 2022-10-20 DIAGNOSIS — Z20822 Contact with and (suspected) exposure to covid-19: Secondary | ICD-10-CM | POA: Insufficient documentation

## 2022-10-20 LAB — RESP PANEL BY RT-PCR (RSV, FLU A&B, COVID)  RVPGX2
Influenza A by PCR: POSITIVE — AB
Influenza B by PCR: NEGATIVE
Resp Syncytial Virus by PCR: NEGATIVE
SARS Coronavirus 2 by RT PCR: NEGATIVE

## 2022-10-20 LAB — GROUP A STREP BY PCR: Group A Strep by PCR: NOT DETECTED

## 2022-10-20 MED ORDER — ONDANSETRON 4 MG PO TBDP
4.0000 mg | ORAL_TABLET | Freq: Once | ORAL | Status: AC
  Administered 2022-10-20: 4 mg via ORAL

## 2022-10-20 MED ORDER — ONDANSETRON 4 MG PO TBDP: 4.0000 mg | ORAL_TABLET | Freq: Three times a day (TID) | ORAL | 0 refills | Status: DC | PRN

## 2022-10-20 MED ORDER — IBUPROFEN 600 MG PO TABS
600.0000 mg | ORAL_TABLET | Freq: Once | ORAL | Status: AC
  Administered 2022-10-20: 600 mg via ORAL
  Filled 2022-10-20: qty 1

## 2022-10-20 MED ORDER — PSEUDOEPH-BROMPHEN-DM 30-2-10 MG/5ML PO SYRP: 5.0000 mL | ORAL_SOLUTION | Freq: Four times a day (QID) | ORAL | 0 refills | Status: DC | PRN

## 2022-10-20 MED ORDER — ONDANSETRON 4 MG PO TBDP
ORAL_TABLET | ORAL | Status: AC
  Filled 2022-10-20: qty 1

## 2022-10-20 NOTE — Discharge Instructions (Addendum)
Follow-up with your primary care provider if any continued problems.  A prescription for Zofran and cough medication was sent to the pharmacy to take as needed.  Increase fluids to stay hydrated especially clear liquids including popsicles can be used.  Tylenol or ibuprofen for fever, body aches, headache as needed.  No working until you have been fever free for 24 hours.  This is extremely contagious.

## 2022-10-20 NOTE — ED Triage Notes (Signed)
Pt comes with c/o generalized body aches for few days. Pt states cough

## 2022-10-20 NOTE — ED Provider Notes (Signed)
[facility] Provider Note    Event Date/Time   First MD Initiated Contact with Patient 10/20/22 1200     (approximate)   History   Generalized Body Aches   HPI  JANNETT SCHMALL is a 51 y.o. female   presents to the ED with complaint of generalized bodyaches and cough for the last several days.  Patient states that she began having fever and chills last evening.  Unable to keep any fluids down today.  She was given Zofran while out in triage and states that nausea has improved greatly.      Physical Exam   Triage Vital Signs: ED Triage Vitals  Enc Vitals Group     BP 10/20/22 1027 (!) 144/87     Pulse Rate 10/20/22 1027 (!) 105     Resp 10/20/22 1027 20     Temp 10/20/22 1027 (!) 101.3 F (38.5 C)     Temp Source 10/20/22 1027 Oral     SpO2 10/20/22 1027 97 %     Weight 10/20/22 1158 218 lb 4.1 oz (99 kg)     Height 10/20/22 1158 5' 4.75" (1.645 m)     Head Circumference --      Peak Flow --      Pain Score 10/20/22 1027 4     Pain Loc --      Pain Edu? --      Excl. in [place]? --     Most recent vital signs: Vitals:   10/20/22 1027 10/20/22 1227  BP: (!) 144/87   Pulse: (!) 105   Resp: 20   Temp: (!) 101.3 F (38.5 C) 99.1 F (37.3 C)  SpO2: 97%      General: Awake, no distress.  CV:  Good peripheral perfusion.  Heart regular rate and rhythm. Resp:  Normal effort.  Lungs are clear bilaterally. Abd:  No distention.  Other:  Mild rhinorrhea.   ED Results / Procedures / Treatments   Labs (all labs ordered are listed, but only abnormal results are displayed) Labs Reviewed  RESP PANEL BY RT-PCR (RSV, FLU A&B, COVID)  [id number] - Abnormal; Notable for the following components:      Result Value   Influenza A by PCR POSITIVE (*)    All other components within normal limits  GROUP A STREP BY PCR       PROCEDURES:  Critical Care performed:   Procedures   MEDICATIONS ORDERED IN ED: Medications  ibuprofen (ADVIL) tablet 600  mg (600 mg Oral Given 10/20/22 1040)  ondansetron (ZOFRAN-ODT) disintegrating tablet 4 mg (4 mg Oral Given 10/20/22 1040)     IMPRESSION / MDM / ASSESSMENT AND PLAN / ED COURSE  I reviewed the triage vital signs and the nursing notes.   Differential diagnosis includes, but is not limited to, influenza, COVID, RSV, strep pharyngitis, viral URI.  51 year old female presents to the ED with complaint of bodyaches and fever began a few days ago.  Patient also has developed cough.  She is unaware of any known sick exposures.  She was given Zofran while in the triage area and states that she is feeling much better.  We discussed results of her swabs and she was made aware that she was positive for influenza A.  She is encouraged to drink fluids frequently, take Tylenol or ibuprofen for body aches, fever or headache.  Prescription for Zofran and Bromfed-DM was sent to the pharmacy.      Patient's presentation is  most consistent with acute complicated illness / injury requiring diagnostic workup.  FINAL CLINICAL IMPRESSION(S) / ED DIAGNOSES   Final diagnoses:  Influenza A     Rx / DC Orders   ED Discharge Orders          Ordered    ondansetron (ZOFRAN-ODT) 4 MG disintegrating tablet  Every 8 hours PRN        10/20/22 1229    brompheniramine-pseudoephedrine-DM 30-2-10 MG/5ML syrup  4 times daily PRN        10/20/22 1229             Note:  This document was prepared using Dragon voice recognition software and may include unintentional dictation errors.   Johnn Hai, PA-C 10/20/22 1521    Nance Pear, MD 10/21/22 Dorthula Perfect

## 2022-10-22 ENCOUNTER — Telehealth: Payer: Self-pay | Admitting: Family Medicine

## 2022-10-22 ENCOUNTER — Telehealth: Payer: Self-pay

## 2022-10-22 NOTE — Telephone Encounter (Signed)
Lacey Decker notified as instructed by telephone.  Patient states understanding.

## 2022-10-22 NOTE — Telephone Encounter (Signed)
Transition Care Management Follow-up Telephone Call Date of discharge and from where: [facility] 10/20/22 How have you been since you were released from the hospital? Little better, was able to eat Any questions or concerns? No  Items Reviewed: Did the pt receive and understand the discharge instructions provided? Yes  Medications obtained and verified? Yes  Other? No  Any new allergies since your discharge? No  Dietary orders reviewed? No Do you have support at home? Yes   Functional Questionnaire: (I = Independent and D = Dependent) ADLs: I  Bathing/Dressing- I  Meal Prep- I  Eating- I  Maintaining continence- I  Transferring/Ambulation- I  Managing Meds- I  Follow up appointments reviewed:  PCP Hospital f/u appt confirmed? Yes  Scheduled to see Dr.Bedsole on 12/15. Are transportation arrangements needed? No  If their condition worsens, is the pt aware to call PCP or go to the Emergency Dept.? Yes Was the patient provided with contact information for the PCP's office or ED? Yes Was to pt encouraged to call back with questions or concerns? Yes

## 2022-10-22 NOTE — Telephone Encounter (Signed)
Patient called and stated she was seen in the ER on Tuesday and was diagnosed with flu and she also stated that her face is hurting and she is congested with sinus and wanted to know if something can be called in for her. Call back number [phone].

## 2022-10-22 NOTE — Telephone Encounter (Signed)
Call Likely symptom of the flu which will take time to resolve.   Would recommend ibuprofen 800 mg p.o. 3 times daily as needed for facial pain.  Continue Flonase 2 sprays per nostril daily.  Start Mucinex D twice daily for cough and congestion.  If symptoms are worsening patient may make an appointment to be evaluated. If she is having shortness of breath she needs to be seen sooner.

## 2022-10-23 ENCOUNTER — Ambulatory Visit: Payer: No Typology Code available for payment source | Admitting: Family Medicine

## 2022-11-11 NOTE — Progress Notes (Signed)
Sent message, via epic in basket, requesting orders in epic from surgeon.  

## 2022-11-13 ENCOUNTER — Encounter (HOSPITAL_COMMUNITY): Payer: Self-pay

## 2022-11-13 NOTE — Progress Notes (Signed)
COVID Vaccine Completed:  Date of COVID positive in last 90 days:  PCP - Eliezer Lofts, MD Cardiologist -   Chest x-ray -  EKG - 04-29-22 Epic Stress Test -  ECHO -  Cardiac Cath -  Pacemaker/ICD device last checked: Spinal Cord Stimulator:  Bowel Prep -   Sleep Study -  CPAP -   Fasting Blood Sugar -  Checks Blood Sugar _____ times a day  Last dose of GLP1 agonist-  N/A GLP1 instructions:  N/A   Last dose of SGLT-2 inhibitors-  N/A SGLT-2 instructions: N/A   Blood Thinner Instructions: Aspirin Instructions: Last Dose:  Activity level:  Can go up a flight of stairs and perform activities of daily living without stopping and without symptoms of chest pain or shortness of breath.  Able to exercise without symptoms  Unable to go up a flight of stairs without symptoms of     Anesthesia review:   Patient denies shortness of breath, fever, cough and chest pain at PAT appointment  Patient verbalized understanding of instructions that were given to them at the PAT appointment. Patient was also instructed that they will need to review over the PAT instructions again at home before surgery.

## 2022-11-13 NOTE — Patient Instructions (Signed)
SURGICAL WAITING ROOM VISITATION Patients having surgery or a procedure may have no more than 2 support people in the waiting area - these visitors may rotate.    If the patient needs to stay at the hospital during part of their recovery, the visitor guidelines for inpatient rooms apply. Pre-op nurse will coordinate an appropriate time for 1 support person to accompany patient in pre-op.  This support person may not rotate.    Please refer to the [facility] website for the visitor guidelines for Inpatients (after your surgery is over and you are in a regular room).   Due to an increase in RSV and influenza rates and associated hospitalizations, children ages 3 and under may not visit patients in [place].     Your procedure is scheduled on: 11-30-22   Report to [facility] Main Entrance    Report to admitting at 10:30 AM   Call this number if you have problems the morning of surgery [phone]   Do not eat food :After Midnight.   After Midnight you may have the following liquids until 10:00 AM/ DAY OF SURGERY  Water Non-Citrus Juices (without pulp, NO RED) Carbonated Beverages Black Coffee (NO MILK/CREAM OR CREAMERS, sugar ok)  Clear Tea (NO MILK/CREAM OR CREAMERS, sugar ok) regular and decaf                             Plain Jell-O (NO RED)                                           Fruit ices (not with fruit pulp, NO RED)                                     Popsicles (NO RED)                                                               Sports drinks like Gatorade (NO RED)                   The day of surgery:  Drink ONE (1) Pre-Surgery  G2 at 10:00 AM the morning of surgery. Drink in one sitting. Do not sip.  This drink was given to you during your hospital  pre-op appointment visit. Nothing else to drink after completing the Pre-Surgery G2.          If you have questions, please contact your surgeon's office.   FOLLOW  ANY ADDITIONAL PRE OP  INSTRUCTIONS YOU RECEIVED FROM YOUR SURGEON'S OFFICE!!!     Oral Hygiene is also important to reduce your risk of infection.                                    Remember - BRUSH YOUR TEETH THE MORNING OF SURGERY WITH YOUR REGULAR TOOTHPASTE   Do NOT smoke after Midnight   Take these medicines the morning of surgery with A SIP OF WATER:   Lorazepam  Omeprazole  DO NOT TAKE ANY ORAL DIABETIC MEDICATIONS DAY OF YOUR SURGERY  Bring CPAP mask and tubing day of surgery.                              You may not have any metal on your body including hair pins, jewelry, and body piercing             Do not wear make-up, lotions, powders, perfumes or deodorant  Do not wear nail polish including gel and S&S, artificial/acrylic nails, or any other type of covering on natural nails including finger and toenails. If you have artificial nails, gel coating, etc. that needs to be removed by a nail salon please have this removed prior to surgery or surgery may need to be canceled/ delayed if the surgeon/ anesthesia feels like they are unable to be safely monitored.   Do not shave  48 hours prior to surgery.         Do not bring valuables to the hospital. [place].   Contacts, dentures or bridgework may not be worn into surgery.   Bring small overnight bag day of surgery.   DO NOT [place]. PHARMACY WILL DISPENSE MEDICATIONS LISTED ON YOUR MEDICATION LIST TO YOU DURING YOUR ADMISSION [place]!    Special Instructions: Bring a copy of your healthcare power of attorney and living will documents the day of surgery if you haven't scanned them before.              Please read over the following fact sheets you were given: IF [place]  If you received a COVID test during your pre-op visit  it is requested that you wear a mask when out in public, stay away  from anyone that may not be feeling well and notify your surgeon if you develop symptoms. If you test positive for Covid or have been in contact with anyone that has tested positive in the last 10 days please notify you surgeon.  [place] - Preparing for Surgery Before surgery, you can play an important role.  Because skin is not sterile, your skin needs to be as free of germs as possible.  You can reduce the number of germs on your skin by washing with CHG (chlorahexidine gluconate) soap before surgery.  CHG is an antiseptic cleaner which kills germs and bonds with the skin to continue killing germs even after washing. Please DO NOT use if you have an allergy to CHG or antibacterial soaps.  If your skin becomes reddened/irritated stop using the CHG and inform your nurse when you arrive at Short Stay. Do not shave (including legs and underarms) for at least 48 hours prior to the first CHG shower.  You may shave your face/neck.  Please follow these instructions carefully:  1.  Shower with CHG Soap the night before surgery and the  morning of surgery.  2.  If you choose to wash your hair, wash your hair first as usual with your normal  shampoo.  3.  After you shampoo, rinse your hair and body thoroughly to remove the shampoo.                             4.  Use CHG as you would  any other liquid soap.  You can apply chg directly to the skin and wash.  Gently with a scrungie or clean washcloth.  5.  Apply the CHG Soap to your body ONLY FROM THE NECK DOWN.   Do   not use on face/ open                           Wound or open sores. Avoid contact with eyes, ears mouth and   genitals (private parts).                       Wash face,  Genitals (private parts) with your normal soap.             6.  Wash thoroughly, paying special attention to the area where your    surgery  will be performed.  7.  Thoroughly rinse your body with warm water from the neck down.  8.  DO NOT shower/wash with your normal soap  after using and rinsing off the CHG Soap.                9.  Pat yourself dry with a clean towel.            10.  Wear clean pajamas.            11.  Place clean sheets on your bed the night of your first shower and do not  sleep with pets. Day of Surgery : Do not apply any lotions/deodorants the morning of surgery.  Please wear clean clothes to the hospital/surgery center.  FAILURE TO FOLLOW THESE INSTRUCTIONS MAY RESULT IN THE CANCELLATION OF YOUR SURGERY  PATIENT SIGNATURE_________________________________  NURSE SIGNATURE__________________________________  ________________________________________________________________________    Lacey Decker  An incentive spirometer is a tool that can help keep your lungs clear and active. This tool measures how well you are filling your lungs with each breath. Taking long deep breaths may help reverse or decrease the chance of developing breathing (pulmonary) problems (especially infection) following: A long period of time when you are unable to move or be active. BEFORE THE PROCEDURE  If the spirometer includes an indicator to show your best effort, your nurse or respiratory therapist will set it to a desired goal. If possible, sit up straight or lean slightly forward. Try not to slouch. Hold the incentive spirometer in an upright position. INSTRUCTIONS FOR USE  Sit on the edge of your bed if possible, or sit up as far as you can in bed or on a chair. Hold the incentive spirometer in an upright position. Breathe out normally. Place the mouthpiece in your mouth and seal your lips tightly around it. Breathe in slowly and as deeply as possible, raising the piston or the ball toward the top of the column. Hold your breath for 3-5 seconds or for as long as possible. Allow the piston or ball to fall to the bottom of the column. Remove the mouthpiece from your mouth and breathe out normally. Rest for a few seconds and repeat Steps 1 through 7  at least 10 times every 1-2 hours when you are awake. Take your time and take a few normal breaths between deep breaths. The spirometer may include an indicator to show your best effort. Use the indicator as a goal to work toward during each repetition. After each set of 10 deep breaths, practice coughing to be sure your lungs are clear. If you  have an incision (the cut made at the time of surgery), support your incision when coughing by placing a pillow or rolled up towels firmly against it. Once you are able to get out of bed, walk around indoors and cough well. You may stop using the incentive spirometer when instructed by your caregiver.  RISKS AND COMPLICATIONS Take your time so you do not get dizzy or light-headed. If you are in pain, you may need to take or ask for pain medication before doing incentive spirometry. It is harder to take a deep breath if you are having pain. AFTER USE Rest and breathe slowly and easily. It can be helpful to keep track of a log of your progress. Your caregiver can provide you with a simple table to help with this. If you are using the spirometer at home, follow these instructions: [place] IF:  You are having difficultly using the spirometer. You have trouble using the spirometer as often as instructed. Your pain medication is not giving enough relief while using the spirometer. You develop fever of 100.5 F (38.1 C) or higher. SEEK IMMEDIATE MEDICAL CARE IF:  You cough up bloody sputum that had not been present before. You develop fever of 102 F (38.9 C) or greater. You develop worsening pain at or near the incision site. MAKE SURE YOU:  Understand these instructions. Will watch your condition. Will get help right away if you are not doing well or get worse. Document Released: 03/08/2007 Document Revised: 01/18/2012 Document Reviewed: 05/09/2007 [facility] Patient Information 2014 [place],  [place].   ________________________________________________________________________

## 2022-11-17 ENCOUNTER — Ambulatory Visit: Payer: Self-pay | Admitting: Physician Assistant

## 2022-11-17 DIAGNOSIS — G8929 Other chronic pain: Secondary | ICD-10-CM

## 2022-11-17 NOTE — H&P (View-Only) (Signed)
TOTAL HIP ADMISSION H&P  Patient is admitted for right total hip arthroplasty.  Subjective:  Chief Complaint: right hip pain  HPI: Lacey Decker, 52 y.o. female, has a history of pain and functional disability in the right hip(s) due to arthritis and patient has failed non-surgical conservative treatments for greater than 12 weeks to include NSAID's and/or analgesics, corticosteriod injections, use of assistive devices, and activity modification.  Onset of symptoms was gradual starting 2 years ago with gradually worsening course since that time.The patient noted no past surgery on the right hip(s).  Patient currently rates pain in the right hip at 10 out of 10 with activity. Patient has night pain, worsening of pain with activity and weight bearing, trendelenberg gait, pain that interfers with activities of daily living, and pain with passive range of motion. Patient has evidence of periarticular osteophytes and joint space narrowing by imaging studies. This condition presents safety issues increasing the risk of falls. .  There is no current active infection.  Patient Active Problem List   Diagnosis Date Noted   Dizziness 04/30/2022   Strep pharyngitis 04/14/2022   Abnormal smell 03/13/2022   Tongue irritation 01/09/2022   Other chronic pain 01/09/2022   Osteoarthritis of right hip 10/28/2021   Lumbar radiculopathy, acute 09/02/2021   Piriformis syndrome of left side 09/02/2021   Trochanteric bursitis of left hip 09/02/2021   Prediabetes 04/22/2021   BMI 35.0-35.9,adult 04/22/2021   Palpitation 11/19/2020   Gastroesophageal reflux disease with esophagitis without hemorrhage    Esophageal dysphagia    Perimenopausal vasomotor symptoms 08/22/2019   Urinary incontinence without sensory awareness 02/08/2018   PMDD (premenstrual dysphoric disorder) 02/08/2018   SUI (stress urinary incontinence, female) 02/08/2018   Trapezius strain, left, initial encounter 01/18/2018   Medial  epicondylitis of left elbow 07/17/2013   OBESITY, UNSPECIFIED 11/21/2010   Allergic rhinitis 11/21/2010   MIGRAINE, COMMON, INTRACTABLE 01/29/2009   MRSA 11/24/2008   VERTEBROBASILAR INSUFFICIENCY 07/08/2007   Bipolar disorder ([place]) 01/26/2007   Generalized anxiety disorder 01/06/2007   PANIC ATTACKS 01/06/2007   Erosive esophagitis 01/06/2007   DYSFUNCTIONAL UTERINE BLEEDING 01/06/2007   ROSACEA 01/06/2007   Past Medical History:  Diagnosis Date   Adenomyosis    Anxiety    Basal cell carcinoma 08/05/2021   R nasal sidewall, MOHs completed 09/23/21   Bipolar 1 disorder ([place])    Chlamydia    as a teen   Dysplastic nevus 01/13/2010   left lat sacral, sacral, 0.4 x 0.3cm - DYSPLASTIC DYSPLASTIC JUNCTIONAL JUNCTIONAL NEVUS WITH MILD MELANOCYTIC MELANOCYTIC ATYPIA   Dysplastic nevus 01/13/2010   left ant axillary fold 0.3 cm - dysplastic compound nevus with mild melanocytic atypia   Endometriosis    GERD (gastroesophageal reflux disease)    MRSA (methicillin resistant staph aureus) culture positive    PMDD (premenstrual dysphoric disorder)    Pre-diabetes    SUI (stress urinary incontinence, female)     Past Surgical History:  Procedure Laterality Date   ABDOMINAL HYSTERECTOMY  2009   total lap hyst, LSO due to adenomyosis/endometriosis   btl  11/09/1998   COLONOSCOPY WITH PROPOFOL N/A 01/10/2020   Procedure: COLONOSCOPY WITH PROPOFOL;  Surgeon: Lin Landsman, MD;  Location: [facility] ENDOSCOPY;  Service: Gastroenterology;  Laterality: N/A;   DILATION AND CURETTAGE OF UTERUS  1997   after miscarriage   ESOPHAGOGASTRODUODENOSCOPY (EGD) WITH PROPOFOL N/A 01/10/2020   Procedure: ESOPHAGOGASTRODUODENOSCOPY (EGD) WITH PROPOFOL;  Surgeon: Lin Landsman, MD;  Location: [place];  Service: Gastroenterology;  Laterality: N/A;   LEEP  2000   misscarriage     x5   NEUROMA SURGERY     lipoma removal Right labia   nsvd     3    Current Outpatient Medications  Medication  Sig Dispense Refill Last Dose   brompheniramine-pseudoephedrine-DM 30-2-10 MG/5ML syrup Take 5 mLs by mouth 4 (four) times daily as needed. (Patient not taking: Reported on 11/16/2022) 120 mL 0    calcium carbonate (TUMS - DOSED IN MG ELEMENTAL CALCIUM) 500 MG chewable tablet Chew 1,500-2,000 tablets by mouth daily as needed for indigestion or heartburn.      fluticasone (FLONASE) 50 MCG/ACT nasal spray Place into both nostrils daily as needed for allergies or rhinitis.      ibuprofen (ADVIL) 200 MG tablet Take 400 mg by mouth every 6 (six) hours as needed for moderate pain.      LORazepam (ATIVAN) 1 MG tablet Take 1 tablet (1 mg total) by mouth 2 (two) times daily. Prn vertigo or anxiety (Patient taking differently: Take 1 mg by mouth 2 (two) times daily as needed for anxiety.) 15 tablet 0    omeprazole (PRILOSEC) 40 MG capsule Take 1 capsule (40 mg total) by mouth 2 (two) times daily before a meal. (Patient taking differently: Take 40 mg by mouth See admin instructions. Take 40 mg daily, may take a second 40 mg dose as needed for heartburn) 180 capsule 3    ondansetron (ZOFRAN-ODT) 4 MG disintegrating tablet Take 1 tablet (4 mg total) by mouth every 8 (eight) hours as needed for nausea or vomiting. (Patient not taking: Reported on 11/16/2022) 12 tablet 0    No current facility-administered medications for this visit.   Allergies  Allergen Reactions   Codeine Anaphylaxis   Morphine Swelling   Clarithromycin     REACTION: Trouble breathing - stomach upset - bad taste in mouth   Erythromycin Base Other (See Comments)    Eye ointment - worsened conjunctivitis and swelling   Augmentin [Amoxicillin-Pot Clavulanate] Rash   Sulfamethoxazole-Trimethoprim Rash    Social History   Tobacco Use   Smoking status: Never   Smokeless tobacco: Never  Substance Use Topics   Alcohol use: No    Alcohol/week: 0.0 standard drinks of alcohol    Family History  Problem Relation Age of Onset   Hypertension  Mother    Hyperlipidemia Mother    Depression Mother    Basal cell carcinoma Mother    Colon polyps Mother    Bipolar disorder Sister    Depression Maternal Grandmother    Lung cancer Maternal Grandfather    Breast cancer Neg Hx      Review of Systems  Musculoskeletal:  Positive for arthralgias.  Psychiatric/Behavioral:  The patient is nervous/anxious.   All other systems reviewed and are negative.   Objective:  Physical Exam Constitutional:      General: She is not in acute distress.    Appearance: Normal appearance.  HENT:     Head: Normocephalic and atraumatic.  Eyes:     Extraocular Movements: Extraocular movements intact.     Pupils: Pupils are equal, round, and reactive to light.  Cardiovascular:     Rate and Rhythm: Normal rate and regular rhythm.     Pulses: Normal pulses.     Heart sounds: Normal heart sounds.  Pulmonary:     Effort: Pulmonary effort is normal. No respiratory distress.     Breath sounds: Normal breath sounds. No wheezing.  Abdominal:  General: Abdomen is flat. Bowel sounds are normal. There is no distension.     Palpations: Abdomen is soft.     Tenderness: There is no abdominal tenderness.  Musculoskeletal:     Cervical back: Normal range of motion and neck supple.     Right hip: Tenderness and bony tenderness present. Decreased range of motion. Decreased strength.  Lymphadenopathy:     Cervical: No cervical adenopathy.  Skin:    General: Skin is warm and dry.     Findings: No erythema or rash.  Neurological:     General: No focal deficit present.     Mental Status: She is alert and oriented to person, place, and time.  Psychiatric:        Mood and Affect: Mood normal.        Behavior: Behavior normal.     Vital signs in last 24 hours: '@VSRANGES'$ @  Labs:   Estimated body mass index is 36.6 kg/m as calculated from the following:   Height as of 10/20/22: 5' 4.75" (1.645 m).   Weight as of 10/20/22: 99 kg.   Imaging  Review Plain radiographs demonstrate moderate degenerative joint disease of the right hip(s). The bone quality appears to be good for age and reported activity level.      Assessment/Plan:  End stage arthritis, right hip(s)  The patient history, physical examination, clinical judgement of the provider and imaging studies are consistent with end stage degenerative joint disease of the right hip(s) and total hip arthroplasty is deemed medically necessary. The treatment options including medical management, injection therapy, arthroscopy and arthroplasty were discussed at length. The risks and benefits of total hip arthroplasty were presented and reviewed. The risks due to aseptic loosening, infection, stiffness, dislocation/subluxation,  thromboembolic complications and other imponderables were discussed.  The patient acknowledged the explanation, agreed to proceed with the plan and consent was signed. Patient is being admitted for inpatient treatment for surgery, pain control, PT, OT, prophylactic antibiotics, VTE prophylaxis, progressive ambulation and ADL's and discharge planning.The patient is planning to be discharged  home with outpt PT   Anticipated LOS equal to or greater than 2 midnights due to - Age 22 and older with one or more of the following:  - Obesity  - Expected need for hospital services (PT, OT, Nursing) required for safe  discharge  - Anticipated need for postoperative skilled nursing care or inpatient rehab  - Active co-morbidities: None OR   - Unanticipated findings during/Post Surgery: None  - Patient is a high risk of re-admission due to: None

## 2022-11-17 NOTE — H&P (Signed)
TOTAL HIP ADMISSION H&P  Patient is admitted for right total hip arthroplasty.  Subjective:  Chief Complaint: right hip pain  HPI: Lacey Decker, 52 y.o. female, has a history of pain and functional disability in the right hip(s) due to arthritis and patient has failed non-surgical conservative treatments for greater than 12 weeks to include NSAID's and/or analgesics, corticosteriod injections, use of assistive devices, and activity modification.  Onset of symptoms was gradual starting 2 years ago with gradually worsening course since that time.The patient noted no past surgery on the right hip(s).  Patient currently rates pain in the right hip at 10 out of 10 with activity. Patient has night pain, worsening of pain with activity and weight bearing, trendelenberg gait, pain that interfers with activities of daily living, and pain with passive range of motion. Patient has evidence of periarticular osteophytes and joint space narrowing by imaging studies. This condition presents safety issues increasing the risk of falls. .  There is no current active infection.  Patient Active Problem List   Diagnosis Date Noted   Dizziness 04/30/2022   Strep pharyngitis 04/14/2022   Abnormal smell 03/13/2022   Tongue irritation 01/09/2022   Other chronic pain 01/09/2022   Osteoarthritis of right hip 10/28/2021   Lumbar radiculopathy, acute 09/02/2021   Piriformis syndrome of left side 09/02/2021   Trochanteric bursitis of left hip 09/02/2021   Prediabetes 04/22/2021   BMI 35.0-35.9,adult 04/22/2021   Palpitation 11/19/2020   Gastroesophageal reflux disease with esophagitis without hemorrhage    Esophageal dysphagia    Perimenopausal vasomotor symptoms 08/22/2019   Urinary incontinence without sensory awareness 02/08/2018   PMDD (premenstrual dysphoric disorder) 02/08/2018   SUI (stress urinary incontinence, female) 02/08/2018   Trapezius strain, left, initial encounter 01/18/2018   Medial  epicondylitis of left elbow 07/17/2013   OBESITY, UNSPECIFIED 11/21/2010   Allergic rhinitis 11/21/2010   MIGRAINE, COMMON, INTRACTABLE 01/29/2009   MRSA 11/24/2008   VERTEBROBASILAR INSUFFICIENCY 07/08/2007   Bipolar disorder ([place]) 01/26/2007   Generalized anxiety disorder 01/06/2007   PANIC ATTACKS 01/06/2007   Erosive esophagitis 01/06/2007   DYSFUNCTIONAL UTERINE BLEEDING 01/06/2007   ROSACEA 01/06/2007   Past Medical History:  Diagnosis Date   Adenomyosis    Anxiety    Basal cell carcinoma 08/05/2021   R nasal sidewall, MOHs completed 09/23/21   Bipolar 1 disorder ([place])    Chlamydia    as a teen   Dysplastic nevus 01/13/2010   left lat sacral, sacral, 0.4 x 0.3cm - DYSPLASTIC DYSPLASTIC JUNCTIONAL JUNCTIONAL NEVUS WITH MILD MELANOCYTIC MELANOCYTIC ATYPIA   Dysplastic nevus 01/13/2010   left ant axillary fold 0.3 cm - dysplastic compound nevus with mild melanocytic atypia   Endometriosis    GERD (gastroesophageal reflux disease)    MRSA (methicillin resistant staph aureus) culture positive    PMDD (premenstrual dysphoric disorder)    Pre-diabetes    SUI (stress urinary incontinence, female)     Past Surgical History:  Procedure Laterality Date   ABDOMINAL HYSTERECTOMY  2009   total lap hyst, LSO due to adenomyosis/endometriosis   btl  11/09/1998   COLONOSCOPY WITH PROPOFOL N/A 01/10/2020   Procedure: COLONOSCOPY WITH PROPOFOL;  Surgeon: Lin Landsman, MD;  Location: [facility] ENDOSCOPY;  Service: Gastroenterology;  Laterality: N/A;   DILATION AND CURETTAGE OF UTERUS  1997   after miscarriage   ESOPHAGOGASTRODUODENOSCOPY (EGD) WITH PROPOFOL N/A 01/10/2020   Procedure: ESOPHAGOGASTRODUODENOSCOPY (EGD) WITH PROPOFOL;  Surgeon: Lin Landsman, MD;  Location: [facility];  Service: Gastroenterology;  Laterality: N/A;   LEEP  2000   misscarriage     x5   NEUROMA SURGERY     lipoma removal Right labia   nsvd     3    Current Outpatient Medications  Medication  Sig Dispense Refill Last Dose   brompheniramine-pseudoephedrine-DM 30-2-10 MG/5ML syrup Take 5 mLs by mouth 4 (four) times daily as needed. (Patient not taking: Reported on 11/16/2022) 120 mL 0    calcium carbonate (TUMS - DOSED IN MG ELEMENTAL CALCIUM) 500 MG chewable tablet Chew 1,500-2,000 tablets by mouth daily as needed for indigestion or heartburn.      fluticasone (FLONASE) 50 MCG/ACT nasal spray Place into both nostrils daily as needed for allergies or rhinitis.      ibuprofen (ADVIL) 200 MG tablet Take 400 mg by mouth every 6 (six) hours as needed for moderate pain.      LORazepam (ATIVAN) 1 MG tablet Take 1 tablet (1 mg total) by mouth 2 (two) times daily. Prn vertigo or anxiety (Patient taking differently: Take 1 mg by mouth 2 (two) times daily as needed for anxiety.) 15 tablet 0    omeprazole (PRILOSEC) 40 MG capsule Take 1 capsule (40 mg total) by mouth 2 (two) times daily before a meal. (Patient taking differently: Take 40 mg by mouth See admin instructions. Take 40 mg daily, may take a second 40 mg dose as needed for heartburn) 180 capsule 3    ondansetron (ZOFRAN-ODT) 4 MG disintegrating tablet Take 1 tablet (4 mg total) by mouth every 8 (eight) hours as needed for nausea or vomiting. (Patient not taking: Reported on 11/16/2022) 12 tablet 0    No current facility-administered medications for this visit.   Allergies  Allergen Reactions   Codeine Anaphylaxis   Morphine Swelling   Clarithromycin     REACTION: Trouble breathing - stomach upset - bad taste in mouth   Erythromycin Base Other (See Comments)    Eye ointment - worsened conjunctivitis and swelling   Augmentin [Amoxicillin-Pot Clavulanate] Rash   Sulfamethoxazole-Trimethoprim Rash    Social History   Tobacco Use   Smoking status: Never   Smokeless tobacco: Never  Substance Use Topics   Alcohol use: No    Alcohol/week: 0.0 standard drinks of alcohol    Family History  Problem Relation Age of Onset   Hypertension  Mother    Hyperlipidemia Mother    Depression Mother    Basal cell carcinoma Mother    Colon polyps Mother    Bipolar disorder Sister    Depression Maternal Grandmother    Lung cancer Maternal Grandfather    Breast cancer Neg Hx      Review of Systems  Musculoskeletal:  Positive for arthralgias.  Psychiatric/Behavioral:  The patient is nervous/anxious.   All other systems reviewed and are negative.   Objective:  Physical Exam Constitutional:      General: She is not in acute distress.    Appearance: Normal appearance.  HENT:     Head: Normocephalic and atraumatic.  Eyes:     Extraocular Movements: Extraocular movements intact.     Pupils: Pupils are equal, round, and reactive to light.  Cardiovascular:     Rate and Rhythm: Normal rate and regular rhythm.     Pulses: Normal pulses.     Heart sounds: Normal heart sounds.  Pulmonary:     Effort: Pulmonary effort is normal. No respiratory distress.     Breath sounds: Normal breath sounds. No wheezing.  Abdominal:  General: Abdomen is flat. Bowel sounds are normal. There is no distension.     Palpations: Abdomen is soft.     Tenderness: There is no abdominal tenderness.  Musculoskeletal:     Cervical back: Normal range of motion and neck supple.     Right hip: Tenderness and bony tenderness present. Decreased range of motion. Decreased strength.  Lymphadenopathy:     Cervical: No cervical adenopathy.  Skin:    General: Skin is warm and dry.     Findings: No erythema or rash.  Neurological:     General: No focal deficit present.     Mental Status: She is alert and oriented to person, place, and time.  Psychiatric:        Mood and Affect: Mood normal.        Behavior: Behavior normal.     Vital signs in last 24 hours: '@VSRANGES'$ @  Labs:   Estimated body mass index is 36.6 kg/m as calculated from the following:   Height as of 10/20/22: 5' 4.75" (1.645 m).   Weight as of 10/20/22: 99 kg.   Imaging  Review Plain radiographs demonstrate moderate degenerative joint disease of the right hip(s). The bone quality appears to be good for age and reported activity level.      Assessment/Plan:  End stage arthritis, right hip(s)  The patient history, physical examination, clinical judgement of the provider and imaging studies are consistent with end stage degenerative joint disease of the right hip(s) and total hip arthroplasty is deemed medically necessary. The treatment options including medical management, injection therapy, arthroscopy and arthroplasty were discussed at length. The risks and benefits of total hip arthroplasty were presented and reviewed. The risks due to aseptic loosening, infection, stiffness, dislocation/subluxation,  thromboembolic complications and other imponderables were discussed.  The patient acknowledged the explanation, agreed to proceed with the plan and consent was signed. Patient is being admitted for inpatient treatment for surgery, pain control, PT, OT, prophylactic antibiotics, VTE prophylaxis, progressive ambulation and ADL's and discharge planning.The patient is planning to be discharged  home with outpt PT   Anticipated LOS equal to or greater than 2 midnights due to - Age 41 and older with one or more of the following:  - Obesity  - Expected need for hospital services (PT, OT, Nursing) required for safe  discharge  - Anticipated need for postoperative skilled nursing care or inpatient rehab  - Active co-morbidities: None OR   - Unanticipated findings during/Post Surgery: None  - Patient is a high risk of re-admission due to: None

## 2022-11-18 ENCOUNTER — Other Ambulatory Visit: Payer: Self-pay

## 2022-11-18 ENCOUNTER — Encounter (HOSPITAL_COMMUNITY)
Admission: RE | Admit: 2022-11-18 | Discharge: 2022-11-18 | Disposition: A | Discharge status: Home / Self Care | Payer: 59 | Source: Ambulatory Visit | Attending: Orthopedic Surgery | Admitting: Orthopedic Surgery

## 2022-11-18 ENCOUNTER — Encounter (HOSPITAL_COMMUNITY): Payer: Self-pay

## 2022-11-18 VITALS — BP 125/78 | HR 97 | Temp 98.4°F | Resp 12 | Ht 65.5 in | Wt 213.0 lb

## 2022-11-18 DIAGNOSIS — R7303 Prediabetes: Secondary | ICD-10-CM | POA: Diagnosis not present

## 2022-11-18 DIAGNOSIS — G8929 Other chronic pain: Secondary | ICD-10-CM | POA: Diagnosis not present

## 2022-11-18 DIAGNOSIS — M25551 Pain in right hip: Secondary | ICD-10-CM | POA: Insufficient documentation

## 2022-11-18 DIAGNOSIS — Z01818 Encounter for other preprocedural examination: Secondary | ICD-10-CM

## 2022-11-18 DIAGNOSIS — Z01812 Encounter for preprocedural laboratory examination: Secondary | ICD-10-CM | POA: Insufficient documentation

## 2022-11-18 HISTORY — DX: Unspecified osteoarthritis, unspecified site: M19.90

## 2022-11-18 HISTORY — DX: Family history of other specified conditions: Z84.89

## 2022-11-18 HISTORY — DX: Prediabetes: R73.03

## 2022-11-18 HISTORY — DX: Pneumonia, unspecified organism: J18.9

## 2022-11-18 LAB — COMPREHENSIVE METABOLIC PANEL
ALT: 17 U/L (ref 0–44)
AST: 19 U/L (ref 15–41)
Albumin: 4.1 g/dL (ref 3.5–5.0)
Alkaline Phosphatase: 54 U/L (ref 38–126)
Anion gap: 6 (ref 5–15)
BUN: 25 mg/dL — ABNORMAL HIGH (ref 6–20)
CO2: 26 mmol/L (ref 22–32)
Calcium: 9.1 mg/dL (ref 8.9–10.3)
Chloride: 108 mmol/L (ref 98–111)
Creatinine, Ser: 0.69 mg/dL (ref 0.44–1.00)
GFR, Estimated: >60 mL/min (ref >60)
Glucose, Bld: 99 mg/dL (ref 70–99)
Potassium: 3.8 mmol/L (ref 3.5–5.1)
Sodium: 140 mmol/L (ref 135–145)
Total Bilirubin: 0.4 mg/dL (ref 0.3–1.2)
Total Protein: 7.1 g/dL (ref 6.5–8.1)

## 2022-11-18 LAB — CBC WITH DIFFERENTIAL/PLATELET
Abs Immature Granulocytes: 0.01 10*3/uL (ref 0.00–0.07)
Basophils Absolute: 0.1 10*3/uL (ref 0.0–0.1)
Basophils Relative: 1 %
Eosinophils Absolute: 0.2 10*3/uL (ref 0.0–0.5)
Eosinophils Relative: 3 %
HCT: 39.5 % (ref 36.0–46.0)
Hemoglobin: 12.8 g/dL (ref 12.0–15.0)
Immature Granulocytes: 0 %
Lymphocytes Relative: 29 %
Lymphs Abs: 1.7 10*3/uL (ref 0.7–4.0)
MCH: 31.8 pg (ref 26.0–34.0)
MCHC: 32.4 g/dL (ref 30.0–36.0)
MCV: 98 fL (ref 80.0–100.0)
Monocytes Absolute: 0.4 10*3/uL (ref 0.1–1.0)
Monocytes Relative: 7 %
Neutro Abs: 3.6 10*3/uL (ref 1.7–7.7)
Neutrophils Relative %: 60 %
Platelets: 291 10*3/uL (ref 150–400)
RBC: 4.03 MIL/uL (ref 3.87–5.11)
RDW: 13.2 % (ref 11.5–15.5)
WBC: 5.9 10*3/uL (ref 4.0–10.5)
nRBC: 0 % (ref 0.0–0.2)

## 2022-11-18 LAB — TYPE AND SCREEN
ABO/RH(D): A POS
Antibody Screen: NEGATIVE

## 2022-11-18 LAB — HEMOGLOBIN A1C
Hgb A1c MFr Bld: 5.7 % — ABNORMAL HIGH (ref 4.8–5.6)
Mean Plasma Glucose: 116.89 mg/dL

## 2022-11-18 LAB — GLUCOSE, CAPILLARY: Glucose-Capillary: 104 mg/dL — ABNORMAL HIGH (ref 70–99)

## 2022-11-18 LAB — SURGICAL PCR SCREEN
MRSA, PCR: NEGATIVE
Staphylococcus aureus: NEGATIVE

## 2022-11-18 NOTE — Progress Notes (Signed)
Patient phoned after her PAT appt to let staff know that her granddaughter had tested positive for Covid.  Patient was advised to take a home test and if positive to notify her surgeon.

## 2022-11-29 MED ORDER — TRANEXAMIC ACID 1000 MG/10ML IV SOLN
2000.0000 mg | INTRAVENOUS | Status: DC
  Filled 2022-11-29: qty 20

## 2022-11-29 NOTE — Anesthesia Preprocedure Evaluation (Addendum)
Anesthesia Evaluation  Patient identified by MRN, date of birth, ID band Patient awake    Reviewed: Allergy & Precautions, NPO status , Patient's Chart, lab work & pertinent test results  History of Anesthesia Complications Negative for: history of anesthetic complications  Airway Mallampati: II  TM Distance: >3 FB Neck ROM: Full    Dental no notable dental hx. (+) Dental Advisory Given   Pulmonary neg pulmonary ROS   Pulmonary exam normal        Cardiovascular negative cardio ROS Normal cardiovascular exam     Neuro/Psych  PSYCHIATRIC DISORDERS Anxiety Depression Bipolar Disorder   negative neurological ROS     GI/Hepatic Neg liver ROS,GERD  Medicated,,  Endo/Other  negative endocrine ROS    Renal/GU negative Renal ROS     Musculoskeletal negative musculoskeletal ROS (+)    Abdominal   Peds  Hematology negative hematology ROS (+)   Anesthesia Other Findings   Reproductive/Obstetrics                             Anesthesia Physical Anesthesia Plan  ASA: 2  Anesthesia Plan: Spinal   Post-op Pain Management: Tylenol PO (pre-op)* and Toradol IV (intra-op)*   Induction:   PONV Risk Score and Plan: 3 and Ondansetron, Propofol infusion and Midazolam  Airway Management Planned: Natural Airway  Additional Equipment:   Intra-op Plan:   Post-operative Plan:   Informed Consent: I have reviewed the patients History and Physical, chart, labs and discussed the procedure including the risks, benefits and alternatives for the proposed anesthesia with the patient or authorized representative who has indicated his/her understanding and acceptance.     Dental advisory given  Plan Discussed with: Anesthesiologist and CRNA  Anesthesia Plan Comments:        Anesthesia Quick Evaluation

## 2022-11-30 ENCOUNTER — Encounter (HOSPITAL_COMMUNITY)
Admission: RE | Disposition: A | Discharge status: Home / Self Care | Payer: Self-pay | Source: Ambulatory Visit | Attending: Orthopedic Surgery

## 2022-11-30 ENCOUNTER — Encounter (HOSPITAL_COMMUNITY): Payer: Self-pay | Admitting: Orthopedic Surgery

## 2022-11-30 ENCOUNTER — Observation Stay (HOSPITAL_COMMUNITY)
Admission: RE | Admit: 2022-11-30 | Discharge: 2022-12-03 | Disposition: A | Discharge status: Home / Self Care | Payer: 59 | Source: Ambulatory Visit | Attending: Orthopedic Surgery | Admitting: Orthopedic Surgery

## 2022-11-30 ENCOUNTER — Other Ambulatory Visit: Payer: Self-pay

## 2022-11-30 ENCOUNTER — Ambulatory Visit (HOSPITAL_COMMUNITY): Payer: 59 | Admitting: Anesthesiology

## 2022-11-30 ENCOUNTER — Ambulatory Visit (HOSPITAL_COMMUNITY): Payer: 59

## 2022-11-30 ENCOUNTER — Ambulatory Visit (HOSPITAL_BASED_OUTPATIENT_CLINIC_OR_DEPARTMENT_OTHER): Payer: 59 | Admitting: Anesthesiology

## 2022-11-30 ENCOUNTER — Observation Stay (HOSPITAL_COMMUNITY): Payer: 59

## 2022-11-30 DIAGNOSIS — M1611 Unilateral primary osteoarthritis, right hip: Secondary | ICD-10-CM

## 2022-11-30 DIAGNOSIS — Z01818 Encounter for other preprocedural examination: Secondary | ICD-10-CM

## 2022-11-30 DIAGNOSIS — Z85828 Personal history of other malignant neoplasm of skin: Secondary | ICD-10-CM | POA: Insufficient documentation

## 2022-11-30 DIAGNOSIS — R7303 Prediabetes: Secondary | ICD-10-CM

## 2022-11-30 HISTORY — PX: TOTAL HIP ARTHROPLASTY: SHX124

## 2022-11-30 LAB — CBC
HCT: 34.7 % — ABNORMAL LOW (ref 36.0–46.0)
Hemoglobin: 11.3 g/dL — ABNORMAL LOW (ref 12.0–15.0)
MCH: 32.4 pg (ref 26.0–34.0)
MCHC: 32.6 g/dL (ref 30.0–36.0)
MCV: 99.4 fL (ref 80.0–100.0)
Platelets: 242 10*3/uL (ref 150–400)
RBC: 3.49 MIL/uL — ABNORMAL LOW (ref 3.87–5.11)
RDW: 13.4 % (ref 11.5–15.5)
WBC: 8.3 10*3/uL (ref 4.0–10.5)
nRBC: 0 % (ref 0.0–0.2)

## 2022-11-30 LAB — ABO/RH: ABO/RH(D): A POS

## 2022-11-30 SURGERY — ARTHROPLASTY, HIP, TOTAL,POSTERIOR APPROACH
Anesthesia: Spinal | Site: Hip | Laterality: Right

## 2022-11-30 MED ORDER — FLUTICASONE PROPIONATE 50 MCG/ACT NA SUSP: 1.0000 | Freq: Every day | NASAL | Status: DC | PRN

## 2022-11-30 MED ORDER — ACETAMINOPHEN 500 MG PO TABS
1000.0000 mg | ORAL_TABLET | Freq: Once | ORAL | Status: AC
  Filled 2022-11-30: qty 2

## 2022-11-30 MED ORDER — FENTANYL CITRATE PF 50 MCG/ML IJ SOSY
PREFILLED_SYRINGE | INTRAMUSCULAR | Status: AC
  Filled 2022-11-30: qty 3

## 2022-11-30 MED ORDER — DEXAMETHASONE SODIUM PHOSPHATE 4 MG/ML IJ SOLN
INTRAMUSCULAR | Status: DC | PRN
  Administered 2022-11-30: 8 mg via INTRAVENOUS

## 2022-11-30 MED ORDER — PHENYLEPHRINE HCL-NACL 20-0.9 MG/250ML-% IV SOLN
INTRAVENOUS | Status: DC | PRN
  Administered 2022-11-30: 30 ug/min via INTRAVENOUS

## 2022-11-30 MED ORDER — TRANEXAMIC ACID-NACL 1000-0.7 MG/100ML-% IV SOLN
1000.0000 mg | INTRAVENOUS | Status: AC
  Administered 2022-11-30: 1000 mg via INTRAVENOUS
  Filled 2022-11-30: qty 100

## 2022-11-30 MED ORDER — SODIUM CHLORIDE 0.9 % IV SOLN: INTRAVENOUS | Status: DC

## 2022-11-30 MED ORDER — MENTHOL 3 MG MT LOZG: 1.0000 | LOZENGE | OROMUCOSAL | Status: DC | PRN

## 2022-11-30 MED ORDER — ZOLPIDEM TARTRATE 5 MG PO TABS: 5.0000 mg | ORAL_TABLET | Freq: Every evening | ORAL | Status: DC | PRN

## 2022-11-30 MED ORDER — ONDANSETRON HCL 4 MG/2ML IJ SOLN
INTRAMUSCULAR | Status: DC | PRN
  Administered 2022-11-30: 4 mg via INTRAVENOUS

## 2022-11-30 MED ORDER — PANTOPRAZOLE SODIUM 40 MG PO TBEC
40.0000 mg | DELAYED_RELEASE_TABLET | Freq: Every day | ORAL | Status: DC
  Administered 2022-11-30 – 2022-12-03 (×4): 40 mg via ORAL
  Filled 2022-11-30 (×4): qty 1

## 2022-11-30 MED ORDER — METHOCARBAMOL 500 MG IVPB - SIMPLE MED
INTRAVENOUS | Status: AC
  Filled 2022-11-30: qty 55

## 2022-11-30 MED ORDER — ALBUMIN HUMAN 5 % IV SOLN
INTRAVENOUS | Status: AC
  Filled 2022-11-30: qty 250

## 2022-11-30 MED ORDER — ASPIRIN 81 MG PO CHEW
81.0000 mg | CHEWABLE_TABLET | Freq: Two times a day (BID) | ORAL | Status: DC
  Administered 2022-11-30 – 2022-12-03 (×6): 81 mg via ORAL
  Filled 2022-11-30 (×6): qty 1

## 2022-11-30 MED ORDER — CHLORHEXIDINE GLUCONATE 0.12 % MT SOLN
15.0000 mL | Freq: Once | OROMUCOSAL | Status: AC
  Administered 2022-11-30: 15 mL via OROMUCOSAL

## 2022-11-30 MED ORDER — POVIDONE-IODINE 10 % EX SWAB: 2.0000 | Freq: Once | CUTANEOUS | Status: DC

## 2022-11-30 MED ORDER — ACETAMINOPHEN 500 MG PO TABS
1000.0000 mg | ORAL_TABLET | Freq: Four times a day (QID) | ORAL | Status: AC
  Administered 2022-11-30 – 2022-12-01 (×4): 1000 mg via ORAL
  Filled 2022-11-30 (×4): qty 2

## 2022-11-30 MED ORDER — CEFAZOLIN SODIUM-DEXTROSE 2-4 GM/100ML-% IV SOLN
2.0000 g | Freq: Once | INTRAVENOUS | Status: AC
  Administered 2022-11-30: 2 g via INTRAVENOUS
  Filled 2022-11-30: qty 100

## 2022-11-30 MED ORDER — PROPOFOL 500 MG/50ML IV EMUL
INTRAVENOUS | Status: DC | PRN
  Administered 2022-11-30: 100 ug/kg/min via INTRAVENOUS

## 2022-11-30 MED ORDER — CALCIUM CARBONATE ANTACID 500 MG PO CHEW
400.0000 mg | CHEWABLE_TABLET | Freq: Every day | ORAL | Status: DC | PRN
  Administered 2022-12-01 – 2022-12-02 (×3): 400 mg via ORAL
  Filled 2022-11-30 (×3): qty 2

## 2022-11-30 MED ORDER — SODIUM CHLORIDE (PF) 0.9 % IJ SOLN
INTRAMUSCULAR | Status: DC | PRN
  Administered 2022-11-30: 50 mL
  Administered 2022-11-30: 10 mL

## 2022-11-30 MED ORDER — KETAMINE HCL 10 MG/ML IJ SOLN
INTRAMUSCULAR | Status: DC | PRN
  Administered 2022-11-30: 30 mg via INTRAVENOUS

## 2022-11-30 MED ORDER — BUPIVACAINE LIPOSOME 1.3 % IJ SUSP: 10.0000 mL | Freq: Once | INTRAMUSCULAR | Status: DC

## 2022-11-30 MED ORDER — HYDROMORPHONE HCL 1 MG/ML IJ SOLN
0.5000 mg | INTRAMUSCULAR | Status: DC | PRN
  Administered 2022-11-30 – 2022-12-01 (×2): 0.5 mg via INTRAVENOUS
  Filled 2022-11-30 (×3): qty 0.5

## 2022-11-30 MED ORDER — PHENYLEPHRINE HCL (PRESSORS) 10 MG/ML IV SOLN
INTRAVENOUS | Status: AC
  Filled 2022-11-30: qty 1

## 2022-11-30 MED ORDER — DEXAMETHASONE SODIUM PHOSPHATE 10 MG/ML IJ SOLN
INTRAMUSCULAR | Status: AC
  Filled 2022-11-30: qty 1

## 2022-11-30 MED ORDER — DIPHENHYDRAMINE HCL 50 MG/ML IJ SOLN
INTRAMUSCULAR | Status: AC
  Administered 2022-11-30: 12.5 mg via INTRAVENOUS
  Filled 2022-11-30: qty 1

## 2022-11-30 MED ORDER — CEFAZOLIN SODIUM-DEXTROSE 2-4 GM/100ML-% IV SOLN
2.0000 g | Freq: Four times a day (QID) | INTRAVENOUS | Status: AC
  Administered 2022-11-30 – 2022-12-01 (×2): 2 g via INTRAVENOUS
  Filled 2022-11-30 (×2): qty 100

## 2022-11-30 MED ORDER — FENTANYL CITRATE (PF) 100 MCG/2ML IJ SOLN
INTRAMUSCULAR | Status: DC | PRN
  Administered 2022-11-30: 100 ug via INTRAVENOUS

## 2022-11-30 MED ORDER — AMISULPRIDE (ANTIEMETIC) 5 MG/2ML IV SOLN: 10.0000 mg | Freq: Once | INTRAVENOUS | Status: DC | PRN

## 2022-11-30 MED ORDER — KETAMINE HCL 50 MG/5ML IJ SOSY
PREFILLED_SYRINGE | INTRAMUSCULAR | Status: AC
  Filled 2022-11-30: qty 5

## 2022-11-30 MED ORDER — TRAMADOL HCL 50 MG PO TABS
50.0000 mg | ORAL_TABLET | Freq: Four times a day (QID) | ORAL | Status: DC
  Administered 2022-11-30: 50 mg via ORAL
  Filled 2022-11-30 (×2): qty 1

## 2022-11-30 MED ORDER — 0.9 % SODIUM CHLORIDE (POUR BTL) OPTIME
TOPICAL | Status: DC | PRN
  Administered 2022-11-30: 1000 mL

## 2022-11-30 MED ORDER — BUPIVACAINE LIPOSOME 1.3 % IJ SUSP
INTRAMUSCULAR | Status: AC
  Filled 2022-11-30: qty 20

## 2022-11-30 MED ORDER — PROMETHAZINE HCL 25 MG/ML IJ SOLN: 6.2500 mg | INTRAMUSCULAR | Status: DC | PRN

## 2022-11-30 MED ORDER — PHENYLEPHRINE HCL (PRESSORS) 10 MG/ML IV SOLN
INTRAVENOUS | Status: DC | PRN
  Administered 2022-11-30: 160 ug via INTRAVENOUS
  Administered 2022-11-30: 80 ug via INTRAVENOUS

## 2022-11-30 MED ORDER — LACTATED RINGERS IV SOLN: INTRAVENOUS | Status: DC

## 2022-11-30 MED ORDER — SODIUM CHLORIDE (PF) 0.9 % IJ SOLN
INTRAMUSCULAR | Status: AC
  Filled 2022-11-30: qty 50

## 2022-11-30 MED ORDER — ONDANSETRON HCL 4 MG PO TABS: 4.0000 mg | ORAL_TABLET | Freq: Four times a day (QID) | ORAL | Status: DC | PRN

## 2022-11-30 MED ORDER — ONDANSETRON HCL 4 MG/2ML IJ SOLN
4.0000 mg | Freq: Four times a day (QID) | INTRAMUSCULAR | Status: DC | PRN
  Administered 2022-12-02: 4 mg via INTRAVENOUS
  Filled 2022-11-30 (×2): qty 2

## 2022-11-30 MED ORDER — FENTANYL CITRATE (PF) 100 MCG/2ML IJ SOLN
INTRAMUSCULAR | Status: AC
  Filled 2022-11-30: qty 2

## 2022-11-30 MED ORDER — PHENYLEPHRINE 80 MCG/ML (10ML) SYRINGE FOR IV PUSH (FOR BLOOD PRESSURE SUPPORT)
PREFILLED_SYRINGE | INTRAVENOUS | Status: AC
  Filled 2022-11-30: qty 10

## 2022-11-30 MED ORDER — ACETAMINOPHEN 500 MG PO TABS
1000.0000 mg | ORAL_TABLET | Freq: Once | ORAL | Status: AC
  Administered 2022-11-30: 1000 mg via ORAL

## 2022-11-30 MED ORDER — DIPHENHYDRAMINE HCL 12.5 MG/5ML PO ELIX: 12.5000 mg | ORAL_SOLUTION | ORAL | Status: DC | PRN

## 2022-11-30 MED ORDER — METHOCARBAMOL 500 MG IVPB - SIMPLE MED
500.0000 mg | Freq: Four times a day (QID) | INTRAVENOUS | Status: DC | PRN
  Administered 2022-11-30: 500 mg via INTRAVENOUS

## 2022-11-30 MED ORDER — PHENOL 1.4 % MT LIQD: 1.0000 | OROMUCOSAL | Status: DC | PRN

## 2022-11-30 MED ORDER — WATER FOR IRRIGATION, STERILE IR SOLN
Status: DC | PRN
  Administered 2022-11-30 (×2): 1000 mL

## 2022-11-30 MED ORDER — FENTANYL CITRATE PF 50 MCG/ML IJ SOSY
25.0000 ug | PREFILLED_SYRINGE | INTRAMUSCULAR | Status: DC | PRN
  Administered 2022-11-30: 50 ug via INTRAVENOUS

## 2022-11-30 MED ORDER — SODIUM CHLORIDE 0.9 % IR SOLN
Status: DC | PRN
  Administered 2022-11-30: 3000 mL

## 2022-11-30 MED ORDER — VANCOMYCIN HCL IN DEXTROSE 1-5 GM/200ML-% IV SOLN
1000.0000 mg | INTRAVENOUS | Status: AC
  Administered 2022-11-30: 1000 mg via INTRAVENOUS
  Filled 2022-11-30: qty 200

## 2022-11-30 MED ORDER — DIPHENHYDRAMINE HCL 50 MG/ML IJ SOLN: 12.5000 mg | Freq: Once | INTRAMUSCULAR | Status: AC

## 2022-11-30 MED ORDER — LORAZEPAM 1 MG PO TABS
1.0000 mg | ORAL_TABLET | Freq: Two times a day (BID) | ORAL | Status: DC | PRN
  Administered 2022-11-30 – 2022-12-02 (×3): 1 mg via ORAL
  Filled 2022-11-30 (×3): qty 1

## 2022-11-30 MED ORDER — FENTANYL CITRATE PF 50 MCG/ML IJ SOSY
25.0000 ug | PREFILLED_SYRINGE | INTRAMUSCULAR | Status: DC | PRN
  Administered 2022-11-30 (×3): 50 ug via INTRAVENOUS

## 2022-11-30 MED ORDER — ALBUMIN HUMAN 5 % IV SOLN: INTRAVENOUS | Status: DC | PRN

## 2022-11-30 MED ORDER — MIDAZOLAM HCL 2 MG/2ML IJ SOLN
INTRAMUSCULAR | Status: AC
  Filled 2022-11-30: qty 2

## 2022-11-30 MED ORDER — ONDANSETRON HCL 4 MG/2ML IJ SOLN
INTRAMUSCULAR | Status: AC
  Filled 2022-11-30: qty 2

## 2022-11-30 MED ORDER — SODIUM CHLORIDE (PF) 0.9 % IJ SOLN
INTRAMUSCULAR | Status: AC
  Filled 2022-11-30: qty 10

## 2022-11-30 MED ORDER — TRAMADOL HCL 50 MG PO TABS
50.0000 mg | ORAL_TABLET | Freq: Four times a day (QID) | ORAL | Status: DC | PRN
  Administered 2022-12-01 – 2022-12-03 (×8): 100 mg via ORAL
  Filled 2022-11-30 (×9): qty 2

## 2022-11-30 MED ORDER — KETOROLAC TROMETHAMINE 15 MG/ML IJ SOLN
7.5000 mg | Freq: Four times a day (QID) | INTRAMUSCULAR | Status: AC
  Administered 2022-11-30 – 2022-12-01 (×4): 7.5 mg via INTRAVENOUS
  Filled 2022-11-30 (×4): qty 1

## 2022-11-30 MED ORDER — MIDAZOLAM HCL 5 MG/5ML IJ SOLN
INTRAMUSCULAR | Status: DC | PRN
  Administered 2022-11-30: 2 mg via INTRAVENOUS

## 2022-11-30 MED ORDER — POLYETHYLENE GLYCOL 3350 17 G PO PACK: 17.0000 g | PACK | Freq: Every day | ORAL | Status: DC | PRN

## 2022-11-30 MED ORDER — FENTANYL CITRATE PF 50 MCG/ML IJ SOSY
PREFILLED_SYRINGE | INTRAMUSCULAR | Status: AC
  Filled 2022-11-30: qty 1

## 2022-11-30 MED ORDER — DOCUSATE SODIUM 100 MG PO CAPS
100.0000 mg | ORAL_CAPSULE | Freq: Two times a day (BID) | ORAL | Status: DC
  Administered 2022-11-30 – 2022-12-03 (×6): 100 mg via ORAL
  Filled 2022-11-30 (×6): qty 1

## 2022-11-30 MED ORDER — METHOCARBAMOL 500 MG PO TABS
500.0000 mg | ORAL_TABLET | Freq: Four times a day (QID) | ORAL | Status: DC | PRN
  Administered 2022-12-01 – 2022-12-03 (×7): 500 mg via ORAL
  Filled 2022-11-30 (×8): qty 1

## 2022-11-30 MED ORDER — ORAL CARE MOUTH RINSE: 15.0000 mL | Freq: Once | OROMUCOSAL | Status: AC

## 2022-11-30 MED ORDER — BUPIVACAINE LIPOSOME 1.3 % IJ SUSP
INTRAMUSCULAR | Status: DC | PRN
  Administered 2022-11-30: 20 mL

## 2022-11-30 MED ORDER — SENNA 8.6 MG PO TABS
1.0000 | ORAL_TABLET | Freq: Two times a day (BID) | ORAL | Status: DC
  Administered 2022-11-30 – 2022-12-03 (×6): 8.6 mg via ORAL
  Filled 2022-11-30 (×6): qty 1

## 2022-11-30 MED ORDER — ACETAMINOPHEN 325 MG PO TABS
325.0000 mg | ORAL_TABLET | Freq: Four times a day (QID) | ORAL | Status: DC | PRN
  Administered 2022-12-01 – 2022-12-03 (×4): 650 mg via ORAL
  Filled 2022-11-30 (×4): qty 2

## 2022-11-30 SURGICAL SUPPLY — 70 items
ADH SKN CLS APL DERMABOND .7 (GAUZE/BANDAGES/DRESSINGS) ×1
ADH SKN CLS LQ APL DERMABOND (GAUZE/BANDAGES/DRESSINGS) ×1
APL PRP STRL LF DISP 70% ISPRP (MISCELLANEOUS) ×2
BAG COUNTER SPONGE SURGICOUNT (BAG) IMPLANT
BAG DECANTER FOR FLEXI CONT (MISCELLANEOUS) ×1 IMPLANT
BAG SPEC THK2 15X12 ZIP CLS (MISCELLANEOUS) ×1
BAG SPNG CNTER NS LX DISP (BAG)
BAG ZIPLOCK 12X15 (MISCELLANEOUS) ×1 IMPLANT
BLADE SAW SAG 25X90X1.19 (BLADE) ×1 IMPLANT
CHLORAPREP W/TINT 26 (MISCELLANEOUS) ×2 IMPLANT
COVER SURGICAL LIGHT HANDLE (MISCELLANEOUS) ×1 IMPLANT
DERMABOND ADVANCED .7 DNX12 (GAUZE/BANDAGES/DRESSINGS) ×1 IMPLANT
DERMABOND ADVANCED .7 DNX6 (GAUZE/BANDAGES/DRESSINGS) IMPLANT
DRAPE HIP W/POCKET STRL (MISCELLANEOUS) ×1 IMPLANT
DRAPE INCISE IOBAN 66X45 STRL (DRAPES) ×1 IMPLANT
DRAPE INCISE IOBAN 85X60 (DRAPES) ×1 IMPLANT
DRAPE POUCH INSTRU U-SHP 10X18 (DRAPES) ×1 IMPLANT
DRAPE SHEET LG 3/4 BI-LAMINATE (DRAPES) ×3 IMPLANT
DRAPE SURG 17X11 SM STRL (DRAPES) ×1 IMPLANT
DRAPE U-SHAPE 47X51 STRL (DRAPES) ×2 IMPLANT
DRESSING AQUACEL AG SP 3.5X10 (GAUZE/BANDAGES/DRESSINGS) ×1 IMPLANT
DRSG AQUACEL AG SP 3.5X10 (GAUZE/BANDAGES/DRESSINGS) ×1
ELECT BLADE TIP CTD 4 INCH (ELECTRODE) ×1 IMPLANT
ELECT REM PT RETURN 15FT ADLT (MISCELLANEOUS) ×1 IMPLANT
GLOVE BIO SURGEON STRL SZ 6.5 (GLOVE) ×2 IMPLANT
GLOVE BIOGEL PI IND STRL 6.5 (GLOVE) ×1 IMPLANT
GLOVE BIOGEL PI IND STRL 8 (GLOVE) ×1 IMPLANT
GLOVE SURG ORTHO 8.0 STRL STRW (GLOVE) ×2 IMPLANT
GOWN STRL REUS W/ TWL XL LVL3 (GOWN DISPOSABLE) ×2 IMPLANT
GOWN STRL REUS W/TWL XL LVL3 (GOWN DISPOSABLE) ×2
HANDPIECE INTERPULSE COAX TIP (DISPOSABLE)
HEAD BIOLOX HIP 36/+2.5 (Joint) IMPLANT
HIP BIOLOX HD 36/+2.5 (Joint) ×1 IMPLANT
HOLDER FOLEY CATH W/STRAP (MISCELLANEOUS) ×1 IMPLANT
HOOD PEEL AWAY T7 (MISCELLANEOUS) ×3 IMPLANT
INSERT 0 DEGREE 36 (Miscellaneous) IMPLANT
JET LAVAGE IRRISEPT WOUND (IRRIGATION / IRRIGATOR)
KIT BASIN OR (CUSTOM PROCEDURE TRAY) ×1 IMPLANT
KIT TURNOVER KIT A (KITS) IMPLANT
LAVAGE JET IRRISEPT WOUND (IRRIGATION / IRRIGATOR) IMPLANT
MANIFOLD NEPTUNE II (INSTRUMENTS) ×1 IMPLANT
MARKER SKIN DUAL TIP RULER LAB (MISCELLANEOUS) ×1 IMPLANT
NEEDLE HYPO 22GX1.5 SAFETY (NEEDLE) IMPLANT
NS IRRIG 1000ML POUR BTL (IV SOLUTION) ×1 IMPLANT
PACK TOTAL JOINT (CUSTOM PROCEDURE TRAY) ×1 IMPLANT
PRESSURIZER FEMORAL UNIV (MISCELLANEOUS) IMPLANT
PROTECTOR NERVE ULNAR (MISCELLANEOUS) ×1 IMPLANT
RETRIEVER SUT HEWSON (MISCELLANEOUS) ×1 IMPLANT
SCREW HEX LP 6.5X30 (Screw) IMPLANT
SEALER BIPOLAR AQUA 6.0 (INSTRUMENTS) ×1 IMPLANT
SET HNDPC FAN SPRY TIP SCT (DISPOSABLE) IMPLANT
SHELL ACETAB TRIDENT 48 (Shell) IMPLANT
SPIKE FLUID TRANSFER (MISCELLANEOUS) ×3 IMPLANT
STEM FEM ACCOLADE 38X102X30 S3 (Stem) IMPLANT
SUCTION FRAZIER HANDLE 12FR (TUBING) ×1
SUCTION TUBE FRAZIER 12FR DISP (TUBING) ×1 IMPLANT
SUT BONE WAX W31G (SUTURE) ×1 IMPLANT
SUT ETHIBOND #5 BRAIDED 30INL (SUTURE) ×1 IMPLANT
SUT MNCRL AB 3-0 PS2 18 (SUTURE) ×1 IMPLANT
SUT STRATAFIX 0 PDS 27 VIOLET (SUTURE) ×1
SUT STRATAFIX PDO 1 14 VIOLET (SUTURE) ×1
SUT STRATFX PDO 1 14 VIOLET (SUTURE) ×1
SUT VIC AB 2-0 CT2 27 (SUTURE) ×2 IMPLANT
SUTURE STRATFX 0 PDS 27 VIOLET (SUTURE) ×1 IMPLANT
SUTURE STRATFX PDO 1 14 VIOLET (SUTURE) ×1 IMPLANT
SYR 20ML LL LF (SYRINGE) ×2 IMPLANT
TOWEL OR 17X26 10 PK STRL BLUE (TOWEL DISPOSABLE) ×1 IMPLANT
TRAY FOLEY MTR SLVR 16FR STAT (SET/KITS/TRAYS/PACK) ×1 IMPLANT
UNDERPAD 30X36 HEAVY ABSORB (UNDERPADS AND DIAPERS) ×1 IMPLANT
WATER STERILE IRR 1000ML POUR (IV SOLUTION) ×2 IMPLANT

## 2022-11-30 NOTE — Transfer of Care (Signed)
Immediate Anesthesia Transfer of Care Note  Patient: [name]  Procedure(s) Performed: TOTAL HIP ARTHROPLASTY (Right: Hip)  Patient Location: PACU  Anesthesia Type:Spinal  Level of Consciousness: awake, alert , oriented, and patient cooperative  Airway & Oxygen Therapy: Patient Spontanous Breathing and Patient connected to face mask oxygen  Post-op Assessment: Report given to RN, Post -op Vital signs reviewed and stable, and Patient moving all extremities X 4  Post vital signs: Reviewed and stable  Last Vitals:  Vitals Value Taken Time  BP 138/74 11/30/22 1520  Temp    Pulse 55 11/30/22 1523  Resp 12 11/30/22 1523  SpO2 100 % 11/30/22 1523  Vitals shown include unvalidated device data.  Last Pain:  Vitals:   11/30/22 1108  TempSrc:   PainSc: 4       Patients Stated Pain Goal: 5 (55/97/41 6384)  Complications: No notable events documented.

## 2022-11-30 NOTE — Plan of Care (Signed)
  Problem: Activity: Goal: Ability to avoid complications of mobility impairment will improve Outcome: Progressing Goal: Ability to tolerate increased activity will improve Outcome: Progressing   Problem: Pain Management: Goal: Pain level will decrease with appropriate interventions Outcome: Progressing   

## 2022-11-30 NOTE — Discharge Instructions (Signed)

## 2022-11-30 NOTE — Interval H&P Note (Signed)
The patient has been re-examined, and the chart reviewed, and there have been no interval changes to the documented history and physical.    Plan for R THA for R hip OA  The operative side was examined and the patient was confirmed to have sensation to DPN, SPN, TN intact, Motor EHL, ext, flex 5/5, and DP 2+, PT 2+, No significant edema.   The risks, benefits, and alternatives have been discussed at length with patient, and the patient is willing to proceed.  Right hip marked. Consent has been signed.  

## 2022-11-30 NOTE — Op Note (Signed)
11/30/2022  2:50 PM  PATIENT:  Lacey Decker   MRN: [id number]  PRE-OPERATIVE DIAGNOSIS: End-stage right hip osteoarthritis  POST-OPERATIVE DIAGNOSIS:  same  PROCEDURE:  Procedure(s): Right total hip arthroplasty  PREOPERATIVE INDICATIONS:    Lacey Decker is an 52 y.o. female who has a diagnosis of end-stage right hip osteoarthritis and elected for surgical management after failing conservative treatment.  The risks benefits and alternatives were discussed with the patient including but not limited to the risks of nonoperative treatment, versus surgical intervention including infection, bleeding, nerve injury, periprosthetic fracture, the need for revision surgery, dislocation, leg length discrepancy, blood clots, cardiopulmonary complications, morbidity, mortality, among others, and they were willing to proceed.     OPERATIVE REPORT     SURGEON:  Charlies Constable, MD    ASSISTANT: Izola Price, RNFA, (Present throughout the entire procedure,  necessary for completion of procedure in a timely manner, assisting with retraction, instrumentation, and closure)     ANESTHESIA: Spinal  ESTIMATED BLOOD LOSS: 341PF    COMPLICATIONS:  None.   COMPONENTS:   Stryker Trident 248 mm acetabular shell, neutral polyethylene liner, 6.5 hex screw x 1, Accolade 2 with 127 degree neck angle hip stem size #3, 36+2.63m ceramic head Implant Name Type Inv. Item Serial No. Manufacturer Lot No. LRB No. Used Action  SCREW HEX LP 6.5X30 - [id number] SCREW HEX LP 6.5X30  [employer] [id number] Right 1 Implanted  SHELL ACETAB TRIDENT 48 - [id number] SHELL ACETAB TRIDENT 48  [employer] [id number] Right 1 Implanted  INSERT 0 DEGREE 36 - [id number] INSERT 0 DEGREE 36  [employer] [id number] Right 1 Implanted  STEM FEM ACCOLADE [id number] - [id number] STEM FEM ACCOLADE [id number]  [employer] [id number] Right 1 Implanted  HIP BIOLOX HD 36/+2.5 -  [id number] HIP BIOLOX HD 36/+2.5  [employer] [id number] 1 Implanted      PROCEDURE IN DETAIL:   The patient was met in the holding area and  identified.  The appropriate hip was identified and marked at the operative site.  The patient was then transported to the OR  and  placed under anesthesia.  At that point, the patient was  placed in the lateral decubitus position with the operative side up and  secured to the operating room table  and all bony prominences padded. A subaxillary role was also placed.    The operative lower extremity was prepped from the iliac crest to the distal leg.  Sterile draping was performed.  Preoperative antibiotics, 2 gm of ancef,1 gm of Tranexamic Acid, and 8 mg of Decadron administered. Time out was performed prior to incision.      A routine posterolateral approach was utilized via sharp dissection  carried down to the subcutaneous tissue.  Gross bleeders were Bovie coagulated.  The iliotibial band was identified and incised along the length of the skin incision through the glute max fascia.  Charnley retractor was placed with care to protect the sciatic nerve posteriorly.  With the hip internally rotated, the piriformis tendon was identified and released from the femoral insertion and tagged with a #5 Ethibond.  A capsulotomy was then performed off the femoral insertion and also tagged with a #5 Ethibond.    The femoral neck was exposed, and I resected the femoral neck based on preoperative templating relative to the lesser trochanter.    I then exposed the deep acetabulum, cleared out any tissue including the ligamentum teres.  After  adequate visualization, I excised the labrum.  I then started reaming with a 44 mm reamer, first medializing to the floor of the cotyloid fossa, and then in the position of the cup aiming towards the greater sciatic notch, matching the version of the transverse acetabular ligament and tucked under the anterior wall. I  reamed up to 48 mm reamer with good bony bed preparation and a 48 mm cup was chosen.  The real cup was then impacted into place.  Appropriate version and inclination was confirmed clinically matching their bony anatomy, and also with the use of the jig.  I placed 1 screw in the posterior superior quadrant to augment fixation.  A neutral liner was placed and impacted. It was confirmed to be appropriately seated and the acetabular retractors were removed.    I then prepared the proximal femur using the box cutter, Charnley awl, and then sequentially broached starting with 0 up to a size 3.  A trial broach, neck, and head was utilized, and I reduced the hip and it was found to have excellent stability.  There was no impingement with full extension and 90 degrees external rotation.  The hip was stable at the position of sleep and with 90 degrees flexion and 80 degrees of internal rotation.  Leg lengths were also clinically assessed in the lateral position and felt to be equal. Intra-Op flatplate was obtained and confirmed appropriate component positions.  Good fill of the femur with the size 3 broach.  And restoration of leg length and offset. No evidence or concern for fracture.  A final femoral prosthesis size 3 was selected. I then impacted the real femoral prosthesis into place.I again trialed and selected a 36+ 2.88m ball. The hip was then reduced and taken through a range of motion. There was no impingement with full extension and 90 degrees external rotation.  The hip was stable at the position of sleep and with 90 degrees flexion and 80 degrees of internal rotation. Leg lengths were  again assessed and felt to be restored.  We then opened, and I impacted the real head ball into place.  The posterior capsule was then closed with #5 Ethibond.  The piriformis was repaired through the base of the abductor tendon using a [place] suture passer.  I then irrigated the hip copiously with dilute Betadine and  with normal saline pulse lavage. Periarticular injection was then performed with Exparel.   We repaired the fascia #1 barbed suture, followed by 0 barbed suture for the subcutaneous fat.  Skin was closed with 2-0 Vicryl and 3-0 Monocryl.  Dermabond and Aquacel dressing were applied. The patient was then awakened and returned to PACU in stable and satisfactory condition.  Leg lengths in the supine position were assessed and felt to be clinically equal. There were no complications.  Post op recs: WB: WBAT RLE, No formal hip precautions Abx: ancef Imaging: PACU pelvis Xray Dressing: Aquacell, keep intact until follow up DVT prophylaxis: Aspirin 81BID starting POD1 Follow up: 2 weeks after surgery [facility] wound [facility]  Address[address] [place] [address] Office Phone: [phone]  DCharlies Constable MD Orthopedic Surgeon

## 2022-12-01 DIAGNOSIS — M1611 Unilateral primary osteoarthritis, right hip: Secondary | ICD-10-CM | POA: Diagnosis not present

## 2022-12-01 LAB — CBC
HCT: 29.8 % — ABNORMAL LOW (ref 36.0–46.0)
Hemoglobin: 9.8 g/dL — ABNORMAL LOW (ref 12.0–15.0)
MCH: 31.7 pg (ref 26.0–34.0)
MCHC: 32.9 g/dL (ref 30.0–36.0)
MCV: 96.4 fL (ref 80.0–100.0)
Platelets: 254 10*3/uL (ref 150–400)
RBC: 3.09 MIL/uL — ABNORMAL LOW (ref 3.87–5.11)
RDW: 13.3 % (ref 11.5–15.5)
WBC: 14 10*3/uL — ABNORMAL HIGH (ref 4.0–10.5)
nRBC: 0 % (ref 0.0–0.2)

## 2022-12-01 LAB — BASIC METABOLIC PANEL
Anion gap: 8 (ref 5–15)
BUN: 17 mg/dL (ref 6–20)
CO2: 23 mmol/L (ref 22–32)
Calcium: 8.3 mg/dL — ABNORMAL LOW (ref 8.9–10.3)
Chloride: 104 mmol/L (ref 98–111)
Creatinine, Ser: 0.7 mg/dL (ref 0.44–1.00)
GFR, Estimated: >60 mL/min (ref >60)
Glucose, Bld: 164 mg/dL — ABNORMAL HIGH (ref 70–99)
Potassium: 3.9 mmol/L (ref 3.5–5.1)
Sodium: 135 mmol/L (ref 135–145)

## 2022-12-01 NOTE — Progress Notes (Signed)
PT TX NOTE  12/01/22 1400  PT Visit Information  Last PT Received On 12/01/22   Pt progressing this PT session, tol incr amb distance in room (short household distance). Pt  expresses concern (of death) regarding soft BP and taking Tramadol. Re-checked pt BP once back to bed, BP 138/71, pt reassured that VS are normal for post surgical pts.  Pt agreeable to taking meds and RN made aware. PT will continue to follow. Will need to incr amb distance and work on stair training prior to d/c.  History of Present Illness 52 yo female s/p R THA,posterior approach on 12/01/22. PMH: GAD, GERD, bipolar, obesity  Subjective Data  Patient Stated Goal home  Precautions  Precautions Knee  Restrictions  Weight Bearing Restrictions No  Other Position/Activity Restrictions WBAT  Pain Assessment  Pain Assessment Faces  Faces Pain Scale 8  Pain Location right hip  Pain Descriptors / Indicators Crying;Sore;Spasm;Grimacing;Operative site guarding;Guarding  Pain Intervention(s) Limited activity within patient's tolerance;Monitored during session;Premedicated before session;Repositioned  Cognition  Arousal/Alertness Awake/alert  Behavior During Therapy Anxious  Overall Cognitive Status Within Functional Limits for tasks assessed  Bed Mobility  Overal bed mobility Needs Assistance  Bed Mobility Supine to Sit;Sit to Supine  Supine to sit Min assist  Sit to supine Min assist  General bed mobility comments assist to progress RLE on and off bed; PT assisted d/t pain  Transfers  Overall transfer level Needs assistance  Equipment used Rolling walker (2 wheels)  Transfers Sit to/from Stand  Sit to Stand Min guard;Supervision  General transfer comment cues for hand placement, RLE position; incr time needed d/t pain  Ambulation/Gait  Ambulation/Gait assistance Min guard  Gait Distance (Feet) 30 Feet (in room distance)  Assistive device Rolling walker (2 wheels)  Gait Pattern/deviations Step-to pattern  General  Gait Details cues for technique, sequence, RW position from self  Gait velocity decr  PT - End of Session  Equipment Utilized During Treatment Gait belt  Activity Tolerance Patient tolerated treatment well  Patient left in bed;with call bell/phone within reach;with bed alarm set;with family/visitor present  Nurse Communication Mobility status   PT - Assessment/Plan  PT Plan Current plan remains appropriate  PT Visit Diagnosis Other abnormalities of gait and mobility (R26.89);Difficulty in walking, not elsewhere classified (R26.2)  PT Frequency (ACUTE ONLY) 7X/week  Follow Up Recommendations Follow physician's recommendations for discharge plan and follow up therapies  Assistance recommended at discharge Intermittent Supervision/Assistance  Patient can return home with the following A little help with walking and/or transfers;Assist for transportation;Help with stairs or ramp for entrance;Assistance with cooking/housework  PT equipment None recommended by PT  AM-PAC PT "6 Clicks" Mobility Outcome Measure (Version 2)  Help needed turning from your back to your side while in a flat bed without using bedrails? 3  Help needed moving from lying on your back to sitting on the side of a flat bed without using bedrails? 3  Help needed moving to and from a bed to a chair (including a wheelchair)? 3  Help needed standing up from a chair using your arms (e.g., wheelchair or bedside chair)? 3  Help needed to walk in hospital room? 3  Help needed climbing 3-5 steps with a railing?  2  6 Click Score 17  Consider Recommendation of Discharge To: Home with [facility]  PT Goal Progression  Progress towards PT goals Progressing toward goals  Acute Rehab PT Goals  PT Goal Formulation With patient  Time For Goal Achievement 12/08/22  Potential to Achieve Goals Good  PT Time Calculation  PT Start Time (ACUTE ONLY) 1400  PT Stop Time (ACUTE ONLY) 1423  PT Time Calculation (min) (ACUTE ONLY) 23 min  PT General  Charges  $$ ACUTE PT VISIT 1 Visit  PT Treatments  $Gait Training 8-22 mins  $Therapeutic Activity 8-22 mins

## 2022-12-01 NOTE — Progress Notes (Signed)
     Subjective:  Patient reports pain as moderate today.  She is anxious to work with therapy today but in good spirits.  Denies distal numbness and tingling.  Has been working on heel slides and knee extension in bed.  Hemoglobin 9.8 this morning.  Discussed possibility of switching tramadol to oxycodone depending on pain control as she tries to mobilize with therapy today.  Objective:   VITALS:   Vitals:   11/30/22 1714 11/30/22 2146 12/01/22 0134 12/01/22 0510  BP: 116/70 122/66 117/67 118/61  Pulse: (!) 55 75 71 72  Resp: '18 17 17 17  '$ Temp: 97.6 F (36.4 C) 98.5 F (36.9 C) 98 F (36.7 C) 98 F (36.7 C)  TempSrc:  Oral Oral Oral  SpO2: 97% 96% 94% 98%  Weight:      Height:        Sensation intact distally Intact pulses distally Dorsiflexion/Plantar flexion intact Incision: dressing C/D/I Compartment soft    Lab Results  Component Value Date   WBC 14.0 (H) 12/01/2022   HGB 9.8 (L) 12/01/2022   HCT 29.8 (L) 12/01/2022   MCV 96.4 12/01/2022   PLT 254 12/01/2022   BMET    Component Value Date/Time   NA 135 12/01/2022 0402   NA 141 08/22/2013 1719   NA 141 04/21/2012 1642   K 3.9 12/01/2022 0402   K 4.0 04/21/2012 1642   CL 104 12/01/2022 0402   CL 106 04/21/2012 1642   CO2 23 12/01/2022 0402   CO2 27 04/21/2012 1642   GLUCOSE 164 (H) 12/01/2022 0402   GLUCOSE 100 (H) 04/21/2012 1642   BUN 17 12/01/2022 0402   BUN 14 08/22/2013 1719   BUN 12 04/21/2012 1642   CREATININE 0.70 12/01/2022 0402   CREATININE 0.63 04/21/2012 1642   CALCIUM 8.3 (L) 12/01/2022 0402   CALCIUM 8.8 04/21/2012 1642   GFRNONAA >60 12/01/2022 0402   GFRNONAA >60 04/21/2012 1642      Xray: Postop x-rays demonstrate THA components in good position no adverse features  Assessment/Plan: 1 Day Post-Op   Principal Problem:   Osteoarthritis of right hip, unspecified osteoarthritis type  Status post right THA 11/30/22  Will continue with tramadol for pain control now however if  patient has significant difficulty with pain control today as she works with therapy may add some oxycodone to see if she tolerates this.  Has done fine with IV Dilaudid.  Patient concerned given history of codeine allergy.  Post op recs: WB: WBAT RLE, No formal hip precautions Abx: ancef Imaging: PACU pelvis Xray Dressing: Aquacell, keep intact until follow up DVT prophylaxis: Aspirin 81BID starting POD1 Follow up: 2 weeks after surgery for a wound check with Dr. Zachery Dakins at [facility].  Address: [address] [place], [place], [place] [address]  Office Phone: [phone]    Willaim Sheng 12/01/2022, 6:56 AM   Charlies Constable, MD  Contact information:   [phone] 7am-5pm epic message Dr. Zachery Dakins, or call office for patient follow up: [phone] After hours and holidays please check [email or web address] for group call information for [facility]

## 2022-12-01 NOTE — Anesthesia Postprocedure Evaluation (Signed)
Anesthesia Post Note  Patient: Lacey Decker  Procedure(s) Performed: TOTAL HIP ARTHROPLASTY (Right: Hip)     Patient location during evaluation: PACU Anesthesia Type: Spinal Level of consciousness: awake and alert Pain management: pain level controlled Vital Signs Assessment: post-procedure vital signs reviewed and stable Respiratory status: spontaneous breathing and respiratory function stable Cardiovascular status: blood pressure returned to baseline and stable Postop Assessment: spinal receding Anesthetic complications: no   No notable events documented.                D'Arcy Abraha DANIEL

## 2022-12-01 NOTE — Evaluation (Signed)
Physical Therapy Evaluation Patient Details Name: Lacey Decker MRN: [id number] DOB: December 14, 1970 Today's Date: 12/01/2022  History of Present Illness  52 yo female s/p R THA,posterior approach on 12/01/22. PMH: GAD, GERD, bipolar, obesity  Clinical Impression  Pt is s/p THA resulting in the deficits listed below (see PT Problem List).  Pt is anxious regarding mobility but willing to work with PT, having  some issues with pain control however is fearful of taking different meds d/t problems in past; RN called to a room to clarify meds and gave her tylenol and a muscle relaxer.  Anticipate pt will not be ready for d/c today given mobility limitations and pain control issues. Will continue efforts to progress as able.  Pt will benefit from skilled PT to increase their independence and safety with mobility to allow discharge to the venue listed below.         Recommendations for follow up therapy are one component of a multi-disciplinary discharge planning process, led by the attending physician.  Recommendations may be updated based on patient status, additional functional criteria and insurance authorization.  Follow Up Recommendations Follow physician's recommendations for discharge plan and follow up therapies      Assistance Recommended at Discharge Intermittent Supervision/Assistance  Patient can return home with the following  A little help with walking and/or transfers;Assist for transportation;Help with stairs or ramp for entrance;Assistance with cooking/housework    Equipment Recommendations None recommended by PT  Recommendations for Other Services       Functional Status Assessment Patient has had a recent decline in their functional status and demonstrates the ability to make significant improvements in function in a reasonable and predictable amount of time.     Precautions / Restrictions Precautions Precautions: Knee Restrictions Weight Bearing Restrictions: No Other  Position/Activity Restrictions: WBAT      Mobility  Bed Mobility Overal bed mobility: Needs Assistance Bed Mobility: Supine to Sit, Sit to Supine     Supine to sit: Min assist Sit to supine: Min guard   General bed mobility comments: assist to progress RLE off bed, incr time and effort. pt able to use gait belt to self assist RLE on to bed    Transfers Overall transfer level: Needs assistance Equipment used: Rolling walker (2 wheels) Transfers: Sit to/from Stand Sit to Stand: Min guard           General transfer comment: cues for hand placement, RLE position; incr time needed    Ambulation/Gait Ambulation/Gait assistance: Min guard Gait Distance (Feet): 20 Feet (x2) Assistive device: Rolling walker (2 wheels) Gait Pattern/deviations: Step-to pattern Gait velocity: decr     General Gait Details: cues for technique, sequence, RW position from self  Stairs            Wheelchair Mobility    Modified Rankin (Stroke Patients Only)       Balance Overall balance assessment: No apparent balance deficits (not formally assessed)                                           Pertinent Vitals/Pain Pain Assessment Pain Assessment: Faces Faces Pain Scale: Hurts whole lot Pain Location: right hip Pain Descriptors / Indicators: Crying, Sore, Spasm, Grimacing, Operative site guarding, Guarding Pain Intervention(s): Limited activity within patient's tolerance, Monitored during session, Premedicated before session, Repositioned    Home Living Family/patient expects to be discharged to::  Private residence Living Arrangements: Alone Available Help at Discharge: Family Type of Home: House Home Access: Stairs to enter   [employer] of Steps: 3 steep steps, platform, 1 step; garage 3 steps   Home Layout: One [place]: Conservation officer, nature (2 wheels) Additional Comments: plans to borrow a 3in1    Prior Function Prior Level of  Function : Independent/Modified Independent                     Hand Dominance        Extremity/Trunk Assessment   Upper Extremity Assessment Upper Extremity Assessment: Overall WFL for tasks assessed    Lower Extremity Assessment Lower Extremity Assessment: RLE deficits/detail RLE Deficits / Details: ankle WFL, knee and hip grossly 2+/5, limited by post op pain       Communication   Communication: No difficulties  Cognition Arousal/Alertness: Awake/alert Behavior During Therapy: Anxious Overall Cognitive Status: Within Functional Limits for tasks assessed                                          General Comments      Exercises     Assessment/Plan    PT Assessment Patient needs continued PT services  PT Problem List Decreased strength;Decreased range of motion;Decreased activity tolerance;Decreased balance;Decreased mobility;Decreased knowledge of precautions;Decreased knowledge of use of DME       PT Treatment Interventions DME instruction;Therapeutic exercise;Therapeutic activities;Patient/family education;Functional mobility training;Gait training;Stair training    PT Goals (Current goals can be found in the Care Plan section)  Acute Rehab PT Goals Patient Stated Goal: home PT Goal Formulation: With patient Time For Goal Achievement: 12/08/22 Potential to Achieve Goals: Good    Frequency 7X/week     Co-evaluation               AM-PAC PT "6 Clicks" Mobility  Outcome Measure Help needed turning from your back to your side while in a flat bed without using bedrails?: A Little Help needed moving from lying on your back to sitting on the side of a flat bed without using bedrails?: A Little Help needed moving to and from a bed to a chair (including a wheelchair)?: A Little Help needed standing up from a chair using your arms (e.g., wheelchair or bedside chair)?: A Little Help needed to walk in hospital room?: A Little Help  needed climbing 3-5 steps with a railing? : A Little 6 Click Score: 18    End of Session Equipment Utilized During Treatment: Gait belt Activity Tolerance: Patient limited by pain Patient left: in bed;with call bell/phone within reach;with bed alarm set;with family/visitor present   PT Visit Diagnosis: Other abnormalities of gait and mobility (R26.89);Difficulty in walking, not elsewhere classified (R26.2)    Time: 9735-3299 PT Time Calculation (min) (ACUTE ONLY): 38 min   Charges:   PT Evaluation $PT Eval Low Complexity: 1 Low PT Treatments $Gait Training: 8-22 mins $Therapeutic Activity: 8-22 mins        Baxter Flattery, PT  Acute Rehab Dept [facility]) [phone]  [facility] Weekend Pager [facility] only)  [phone]  12/01/2022   [facility] 12/01/2022, 12:38 PM

## 2022-12-01 NOTE — TOC Transition Note (Signed)
Transition of Care [facility]) - CM/SW Discharge Note  Patient Details  Name: Lacey Decker MRN: [id number] Date of Birth: 1971/06/21  Transition of Care [facility]) CM/SW Contact:  Sherie Don, LCSW Phone Number: 12/01/2022, 1:21 PM  Clinical Narrative: Patient will discharge home after working with PT. CSW met with patient and mother to confirm discharge plan. Patient will go home with OPPT in [place]. Patient has a rolling walker at home, so there are no DME needs at this time. TOC signing off.  Final next level of care: OP Rehab Barriers to Discharge: No Barriers Identified  Patient Goals and CMS Choice Choice offered to / list presented to : NA  Discharge Plan and Services Additional resources added to the After Visit Summary for         DME Arranged: N/A DME Agency: NA  Social Determinants of Health (SDOH) Interventions SDOH Screenings   Food Insecurity: No Food Insecurity (11/30/2022)  Housing: Low Risk  (11/30/2022)  Transportation Needs: No Transportation Needs (11/30/2022)  Utilities: Not At Risk (11/30/2022)  Depression (PHQ2-9): Low Risk  (05/18/2019)  Tobacco Use: Low Risk  (11/30/2022)   Readmission Risk Interventions     No data to display

## 2022-12-02 ENCOUNTER — Encounter (HOSPITAL_COMMUNITY): Payer: Self-pay | Admitting: Orthopedic Surgery

## 2022-12-02 DIAGNOSIS — M1611 Unilateral primary osteoarthritis, right hip: Secondary | ICD-10-CM | POA: Diagnosis not present

## 2022-12-02 LAB — CBC
HCT: 25.1 % — ABNORMAL LOW (ref 36.0–46.0)
Hemoglobin: 8.5 g/dL — ABNORMAL LOW (ref 12.0–15.0)
MCH: 33.1 pg (ref 26.0–34.0)
MCHC: 33.9 g/dL (ref 30.0–36.0)
MCV: 97.7 fL (ref 80.0–100.0)
Platelets: 212 10*3/uL (ref 150–400)
RBC: 2.57 MIL/uL — ABNORMAL LOW (ref 3.87–5.11)
RDW: 13.6 % (ref 11.5–15.5)
WBC: 8.3 10*3/uL (ref 4.0–10.5)
nRBC: 0 % (ref 0.0–0.2)

## 2022-12-02 MED ORDER — ONDANSETRON HCL 4 MG PO TABS: 4.0000 mg | ORAL_TABLET | Freq: Three times a day (TID) | ORAL | 0 refills | Status: AC | PRN

## 2022-12-02 MED ORDER — TRAMADOL HCL 50 MG PO TABS: 50.0000 mg | ORAL_TABLET | Freq: Four times a day (QID) | ORAL | 0 refills | Status: AC | PRN

## 2022-12-02 MED ORDER — CELECOXIB 100 MG PO CAPS: 100.0000 mg | ORAL_CAPSULE | Freq: Two times a day (BID) | ORAL | 0 refills | Status: DC

## 2022-12-02 MED ORDER — ACETAMINOPHEN 500 MG PO TABS: 1000.0000 mg | ORAL_TABLET | Freq: Three times a day (TID) | ORAL | 0 refills | Status: AC | PRN

## 2022-12-02 MED ORDER — ASPIRIN 81 MG PO TBEC: 81.0000 mg | DELAYED_RELEASE_TABLET | Freq: Two times a day (BID) | ORAL | 0 refills | Status: AC

## 2022-12-02 MED ORDER — CALCIUM CARBONATE ANTACID 500 MG PO CHEW: 1500.0000 mg | CHEWABLE_TABLET | Freq: Every day | ORAL | Status: AC | PRN

## 2022-12-02 MED ORDER — METHOCARBAMOL 500 MG PO TABS: 500.0000 mg | ORAL_TABLET | Freq: Three times a day (TID) | ORAL | 0 refills | Status: AC | PRN

## 2022-12-02 NOTE — Progress Notes (Signed)
     Subjective: Patient reports pain as moderate today.  She worked well with PT yesterday. Wants to continue with tramadol for pain control. Hgb 8.5 this AM. No indication for transfusion. Will see how she does with PT today. Hopeful for discharge home.  Objective:   VITALS:   Vitals:   12/01/22 0934 12/01/22 1326 12/01/22 2118 12/02/22 0535  BP: 107/67 (!) 108/55 120/60 (!) 103/57  Pulse: 69 79 82 69  Resp: '18 18 14 16  '$ Temp: 98 F (36.7 C) 97.9 F (36.6 C) 98.9 F (37.2 C) 98.1 F (36.7 C)  TempSrc:   Oral   SpO2: 100% 100% 100% 96%  Weight:      Height:        Sensation intact distally Intact pulses distally Dorsiflexion/Plantar flexion intact Incision: dressing C/D/I Compartment soft    Lab Results  Component Value Date   WBC 8.3 12/02/2022   HGB 8.5 (L) 12/02/2022   HCT 25.1 (L) 12/02/2022   MCV 97.7 12/02/2022   PLT 212 12/02/2022   BMET    Component Value Date/Time   NA 135 12/01/2022 0402   NA 141 08/22/2013 1719   NA 141 04/21/2012 1642   K 3.9 12/01/2022 0402   K 4.0 04/21/2012 1642   CL 104 12/01/2022 0402   CL 106 04/21/2012 1642   CO2 23 12/01/2022 0402   CO2 27 04/21/2012 1642   GLUCOSE 164 (H) 12/01/2022 0402   GLUCOSE 100 (H) 04/21/2012 1642   BUN 17 12/01/2022 0402   BUN 14 08/22/2013 1719   BUN 12 04/21/2012 1642   CREATININE 0.70 12/01/2022 0402   CREATININE 0.63 04/21/2012 1642   CALCIUM 8.3 (L) 12/01/2022 0402   CALCIUM 8.8 04/21/2012 1642   GFRNONAA >60 12/01/2022 0402   GFRNONAA >60 04/21/2012 1642      Xray: Postop x-rays demonstrate THA components in good position no adverse features  Assessment/Plan: 2 Days Post-Op   Principal Problem:   Osteoarthritis of right hip, unspecified osteoarthritis type  Status post right THA 11/30/22  Will continue with tramadol for pain control now however if patient has significant difficulty with pain control today as she works with therapy may add some oxycodone to see if she  tolerates this.  Has done fine with IV Dilaudid.  Patient concerned given history of codeine allergy.  Post op recs: WB: WBAT RLE, No formal hip precautions Abx: ancef Imaging: PACU pelvis Xray Dressing: Aquacell, keep intact until follow up DVT prophylaxis: Aspirin 81BID starting POD1 Follow up: 2 weeks after surgery for a wound check with Dr. Zachery Dakins at [facility].  Address: [address] [place], [place], [place] [address]  Office Phone: [phone]    Willaim Sheng 12/02/2022, 7:17 AM   Charlies Constable, MD  Contact information:   [phone] 7am-5pm epic message Dr. Zachery Dakins, or call office for patient follow up: [phone] After hours and holidays please check [email or web address] for group call information for [facility]

## 2022-12-02 NOTE — Progress Notes (Signed)
Physical Therapy Treatment Patient Details Name: Lacey Decker MRN: [id number] DOB: December 28, 1970 Today's Date: 12/02/2022   History of Present Illness 52 yo female s/p R THA,posterior approach on 12/01/22. PMH: GAD, GERD, bipolar, obesity    PT Comments    Pt making excellent progress this session, incr gait distance/tolerance and able to progress ROM right hip with therapeutic ex's. Pt with post op pain but was able to take tramadol this am.Will continue to follow and progress as able.   Recommendations for follow up therapy are one component of a multi-disciplinary discharge planning process, led by the attending physician.  Recommendations may be updated based on patient status, additional functional criteria and insurance authorization.  Follow Up Recommendations  Follow physician's recommendations for discharge plan and follow up therapies     Assistance Recommended at Discharge Intermittent Supervision/Assistance  Patient can return home with the following A little help with walking and/or transfers;Assist for transportation;Help with stairs or ramp for entrance;Assistance with cooking/housework   Equipment Recommendations  None recommended by PT    Recommendations for Other Services       Precautions / Restrictions Precautions Precautions: None Precaution Comments: no formal hip precautions Restrictions Weight Bearing Restrictions: No Other Position/Activity Restrictions: WBAT     Mobility  Bed Mobility Overal bed mobility: Needs Assistance Bed Mobility: Supine to Sit, Sit to Supine     Supine to sit: Supervision Sit to supine: Min assist   General bed mobility comments: supervision to come to sit, abl eto use leg lifter to self assist HOB 30 degrees; assist to progress RLE on to bed with use of gait belt; incr time however good pt effort    Transfers Overall transfer level: Needs assistance Equipment used: Rolling walker (2 wheels) Transfers: Sit to/from  Stand Sit to Stand: Supervision           General transfer comment: cues for hand placement, RLE position and to power up with LLE for pain control. STS from bed and [facility]    Ambulation/Gait Ambulation/Gait assistance: Supervision Gait Distance (Feet): 40 Feet (15') Assistive device: Rolling walker (2 wheels) Gait Pattern/deviations: Step-to pattern, Decreased weight shift to right, Antalgic       General Gait Details: cues for technique, sequence, RW position from self; supervision for safety, no phsyical assist, no LOB   Stairs             Wheelchair Mobility    Modified Rankin (Stroke Patients Only)       Balance                                            Cognition Arousal/Alertness: Awake/alert Behavior During Therapy: Anxious Overall Cognitive Status: Within Functional Limits for tasks assessed                                          Exercises Total Joint Exercises Ankle Circles/Pumps: AROM, Both, 5 reps Heel Slides: AAROM, AROM, Right, 10 reps Hip ABduction/ADduction: AAROM, Right, 10 reps    General Comments        Pertinent Vitals/Pain Pain Assessment Pain Assessment: Faces Faces Pain Scale: Hurts even more Pain Location: right hip Pain Descriptors / Indicators: Sore, Spasm, Grimacing, Operative site guarding, Guarding Pain Intervention(s): Limited activity within patient's tolerance, Monitored  during session, Premedicated before session, Repositioned    Home Living                          Prior Function            PT Goals (current goals can now be found in the care plan section) Acute Rehab PT Goals Patient Stated Goal: less pain PT Goal Formulation: With patient Time For Goal Achievement: 12/08/22 Potential to Achieve Goals: Good Progress towards PT goals: Progressing toward goals    Frequency    7X/week      PT Plan Current plan remains appropriate    Co-evaluation               AM-PAC PT "6 Clicks" Mobility   Outcome Measure  Help needed turning from your back to your side while in a flat bed without using bedrails?: A Little Help needed moving from lying on your back to sitting on the side of a flat bed without using bedrails?: A Little Help needed moving to and from a bed to a chair (including a wheelchair)?: A Little Help needed standing up from a chair using your arms (e.g., wheelchair or bedside chair)?: A Little Help needed to walk in hospital room?: A Little Help needed climbing 3-5 steps with a railing? : A Lot 6 Click Score: 17    End of Session Equipment Utilized During Treatment: Gait belt Activity Tolerance: Patient tolerated treatment well Patient left: in bed;with call bell/phone within reach;with bed alarm set Nurse Communication: Mobility status PT Visit Diagnosis: Other abnormalities of gait and mobility (R26.89);Difficulty in walking, not elsewhere classified (R26.2)     Time: 7588-3254 PT Time Calculation (min) (ACUTE ONLY): 27 min  Charges:  $Gait Training: 8-22 mins $Therapeutic Activity: 8-22 mins                     Baxter Flattery, PT  Acute Rehab Dept [facility]) [phone]  [facility] Weekend Pager [facility] only)  [phone]  12/02/2022    [facility] 12/02/2022, 12:14 PM

## 2022-12-02 NOTE — Plan of Care (Signed)
  Problem: Activity: Goal: Ability to avoid complications of mobility impairment will improve Outcome: Progressing Goal: Ability to tolerate increased activity will improve Outcome: Progressing   Problem: Pain Management: Goal: Pain level will decrease with appropriate interventions Outcome: Progressing   Problem: Safety: Goal: Ability to remain free from injury will improve Outcome: Progressing

## 2022-12-02 NOTE — Progress Notes (Signed)
Physical Therapy Treatment Patient Details Name: Lacey Decker MRN: [id number] DOB: December 02, 1970 Today's Date: 12/02/2022   History of Present Illness 52 yo female s/p R THA,posterior approach on 12/01/22. PMH: GAD, GERD, bipolar, obesity    PT Comments    Pt making good progress with mobility. Decr assist overall and incr gait distance. Began stair training  this pm and will continue to reinforce techniques, facilitate pt independence in am.  Pt should be ready to d/c after one to two sessions tomorrow, discussed with pt and family.  Recommendations for follow up therapy are one component of a multi-disciplinary discharge planning process, led by the attending physician.  Recommendations may be updated based on patient status, additional functional criteria and insurance authorization.  Follow Up Recommendations  Follow physician's recommendations for discharge plan and follow up therapies     Assistance Recommended at Discharge Intermittent Supervision/Assistance  Patient can return home with the following A little help with walking and/or transfers;Assist for transportation;Help with stairs or ramp for entrance;Assistance with cooking/housework   Equipment Recommendations  None recommended by PT    Recommendations for Other Services       Precautions / Restrictions Precautions Precautions: None Precaution Comments: no formal hip precautions Restrictions Weight Bearing Restrictions: No Other Position/Activity Restrictions: WBAT     Mobility  Bed Mobility Overal bed mobility: Needs Assistance Bed Mobility: Supine to Sit, Sit to Supine     Supine to sit: Supervision Sit to supine: Min assist, Supervision   General bed mobility comments: supervision to come to sit, able to use leg lifter to self assist HOB 20 degrees; able to self assist  RLE on to bed with use of gait belt; incr time however good pt effort    Transfers Overall transfer level: Needs assistance Equipment  used: Rolling walker (2 wheels) Transfers: Sit to/from Stand Sit to Stand: Supervision           General transfer comment: cues for hand placement, RLE position and to power up with LLE for pain control. STS from bed and BSC; prefers to pull up on walker to stand    Ambulation/Gait Ambulation/Gait assistance: Supervision Gait Distance (Feet): 55 Feet (15) Assistive device: Rolling walker (2 wheels) Gait Pattern/deviations: Step-to pattern, Decreased weight shift to right, Antalgic Gait velocity: decr     General Gait Details: cues for technique, sequence, RW position from self; supervision for safety, no phsyical assist, no LOB. worked on [place] in [place], pt unable to get heel on floor d/t pain durin gait   Stairs Stairs: Yes Stairs assistance: Min assist Stair Management: No rails, Step to pattern, Backwards, With walker Number of Stairs: 2 General stair comments: min assist to stabilize RW. cues for technique and sequence. stair training observed by pt's mother   Wheelchair Mobility    Modified Rankin (Stroke Patients Only)       Balance                                            Cognition Arousal/Alertness: Awake/alert Behavior During Therapy: Anxious Overall Cognitive Status: Within Functional Limits for tasks assessed                                          Exercises Total Joint  Exercises Ankle Circles/Pumps: AROM, Both, 5 reps Heel Slides: AAROM, AROM, Right, 10 reps Hip ABduction/ADduction:  (reports doing ex on her own)    General Comments        Pertinent Vitals/Pain Pain Assessment Pain Assessment: Faces Faces Pain Scale: Hurts little more Pain Location: right hip Pain Descriptors / Indicators: Sore, Spasm, Grimacing, Operative site guarding, Guarding Pain Intervention(s): Limited activity within patient's tolerance, Monitored during session, Premedicated before session, Repositioned     Home Living                          Prior Function            PT Goals (current goals can now be found in the care plan section) Acute Rehab PT Goals Patient Stated Goal: less pain PT Goal Formulation: With patient Time For Goal Achievement: 12/08/22 Potential to Achieve Goals: Good Progress towards PT goals: Progressing toward goals    Frequency    7X/week      PT Plan Current plan remains appropriate    Co-evaluation              AM-PAC PT "6 Clicks" Mobility   Outcome Measure  Help needed turning from your back to your side while in a flat bed without using bedrails?: A Little Help needed moving from lying on your back to sitting on the side of a flat bed without using bedrails?: A Little Help needed moving to and from a bed to a chair (including a wheelchair)?: A Little Help needed standing up from a chair using your arms (e.g., wheelchair or bedside chair)?: A Little Help needed to walk in hospital room?: A Little Help needed climbing 3-5 steps with a railing? : A Little 6 Click Score: 18    End of Session Equipment Utilized During Treatment: Gait belt Activity Tolerance: Patient tolerated treatment well Patient left: in bed;with call bell/phone within reach;with bed alarm set;with family/visitor present Nurse Communication: Mobility status PT Visit Diagnosis: Other abnormalities of gait and mobility (R26.89);Difficulty in walking, not elsewhere classified (R26.2)     Time: 0240-9735 PT Time Calculation (min) (ACUTE ONLY): 30 min  Charges:  $Gait Training: 23-37 mins                     Baxter Flattery, PT  Acute Rehab Dept [facility]) [phone]  [facility] Weekend Pager [facility] only)  [phone]  12/02/2022    [facility] 12/02/2022, 4:10 PM

## 2022-12-03 DIAGNOSIS — M1611 Unilateral primary osteoarthritis, right hip: Secondary | ICD-10-CM | POA: Diagnosis not present

## 2022-12-03 LAB — HEMOGLOBIN AND HEMATOCRIT, BLOOD
HCT: 24.2 % — ABNORMAL LOW (ref 36.0–46.0)
Hemoglobin: 8 g/dL — ABNORMAL LOW (ref 12.0–15.0)

## 2022-12-03 MED ORDER — IBUPROFEN 800 MG PO TABS: 800.0000 mg | ORAL_TABLET | Freq: Three times a day (TID) | ORAL | 0 refills | Status: DC

## 2022-12-03 MED ORDER — ACETAMINOPHEN 500 MG PO TABS
1000.0000 mg | ORAL_TABLET | Freq: Three times a day (TID) | ORAL | Status: DC
  Administered 2022-12-03: 1000 mg via ORAL
  Filled 2022-12-03: qty 2

## 2022-12-03 MED ORDER — MELOXICAM 15 MG PO TABS: 15.0000 mg | ORAL_TABLET | Freq: Every day | ORAL | Status: DC

## 2022-12-03 MED ORDER — MELOXICAM 15 MG PO TABS: 15.0000 mg | ORAL_TABLET | Freq: Every day | ORAL | 0 refills | Status: DC

## 2022-12-03 MED ORDER — IBUPROFEN 400 MG PO TABS
800.0000 mg | ORAL_TABLET | Freq: Three times a day (TID) | ORAL | Status: DC
  Administered 2022-12-03: 800 mg via ORAL
  Filled 2022-12-03: qty 2

## 2022-12-03 NOTE — Plan of Care (Signed)
Pt ready to DC home with daughters.

## 2022-12-03 NOTE — Progress Notes (Signed)
     Subjective: Patient reports pain getting gradually better.  Progressing well with physical therapy today.  Hopefully she will able to clear therapy and go home later today.  Denies distal numbness and tingling.  Added scheduled Tylenol and ibuprofen for multimodal pain management today.  Objective:   VITALS:   Vitals:   12/02/22 0535 12/02/22 1329 12/02/22 2130 12/03/22 0515  BP: (!) 103/57 (!) 108/58 120/64 (!) 99/54  Pulse: 69 74 82 84  Resp: '16 18 18 18  '$ Temp: 98.1 F (36.7 C) 98.6 F (37 C) 98.6 F (37 C) 98.7 F (37.1 C)  TempSrc:   Oral Oral  SpO2: 96% 98% 100% 94%  Weight:      Height:        Sensation intact distally Intact pulses distally Dorsiflexion/Plantar flexion intact Incision: dressing C/D/I Compartment soft    Lab Results  Component Value Date   WBC 8.3 12/02/2022   HGB 8.5 (L) 12/02/2022   HCT 25.1 (L) 12/02/2022   MCV 97.7 12/02/2022   PLT 212 12/02/2022   BMET    Component Value Date/Time   NA 135 12/01/2022 0402   NA 141 08/22/2013 1719   NA 141 04/21/2012 1642   K 3.9 12/01/2022 0402   K 4.0 04/21/2012 1642   CL 104 12/01/2022 0402   CL 106 04/21/2012 1642   CO2 23 12/01/2022 0402   CO2 27 04/21/2012 1642   GLUCOSE 164 (H) 12/01/2022 0402   GLUCOSE 100 (H) 04/21/2012 1642   BUN 17 12/01/2022 0402   BUN 14 08/22/2013 1719   BUN 12 04/21/2012 1642   CREATININE 0.70 12/01/2022 0402   CREATININE 0.63 04/21/2012 1642   CALCIUM 8.3 (L) 12/01/2022 0402   CALCIUM 8.8 04/21/2012 1642   GFRNONAA >60 12/01/2022 0402   GFRNONAA >60 04/21/2012 1642      Xray: Postop x-rays demonstrate THA components in good position no adverse features  Assessment/Plan: 3 Days Post-Op   Principal Problem:   Osteoarthritis of right hip, unspecified osteoarthritis type  Status post right THA 11/30/22    Post op recs: WB: WBAT RLE, No formal hip precautions Abx: ancef Imaging: PACU pelvis Xray Dressing: Aquacell, keep intact until follow  up DVT prophylaxis: Aspirin 81BID starting POD1 Follow up: 2 weeks after surgery for a wound check with Dr. Zachery Dakins at [place].  Address: [address] [place], [place], [place] [address]  Office Phone: [phone]    Willaim Sheng 12/03/2022, 6:57 AM   Charlies Constable, MD  Contact information:   [phone] 7am-5pm epic message Dr. Zachery Dakins, or call office for patient follow up: [phone] After hours and holidays please check [email or web address] for group call information for [facility]

## 2022-12-03 NOTE — Plan of Care (Signed)
Plan of care reviewed and discussed. °

## 2022-12-03 NOTE — Progress Notes (Signed)
Physical Therapy Treatment Patient Details Name: Lacey Decker MRN: [id number] DOB: Oct 14, 1971 Today's Date: 12/03/2022   History of Present Illness 52 yo female s/p R THA,posterior approach on 12/01/22. PMH: GAD, GERD, bipolar, obesity    PT Comments    Pt is progressing well with mobility, she tolerated increased ambulation distance of 120' with RW with improved heel strike on R, no loss of balance. Reviewed stair training and THA HEP. She demonstrates good understanding of both. She is ready to DC home from a PT standpoint.    Recommendations for follow up therapy are one component of a multi-disciplinary discharge planning process, led by the attending physician.  Recommendations may be updated based on patient status, additional functional criteria and insurance authorization.  Follow Up Recommendations  Follow physician's recommendations for discharge plan and follow up therapies     Assistance Recommended at Discharge    Patient can return home with the following A little help with walking and/or transfers;Assist for transportation;Help with stairs or ramp for entrance;Assistance with cooking/housework   Equipment Recommendations  None recommended by PT    Recommendations for Other Services       Precautions / Restrictions Precautions Precautions: None Precaution Comments: no formal hip precautions Restrictions Weight Bearing Restrictions: No Other Position/Activity Restrictions: WBAT     Mobility  Bed Mobility Overal bed mobility: Needs Assistance Bed Mobility: Supine to Sit     Supine to sit: Modified independent (Device/Increase time), HOB elevated     General bed mobility comments: HOB up, used bedrail    Transfers Overall transfer level: Needs assistance Equipment used: Rolling walker (2 wheels) Transfers: Sit to/from Stand Sit to Stand: Supervision           General transfer comment: cues for hand placement    Ambulation/Gait Ambulation/Gait  assistance: Supervision Gait Distance (Feet): 120 Feet Assistive device: Rolling walker (2 wheels) Gait Pattern/deviations: Step-to pattern, Decreased weight shift to right Gait velocity: decr     General Gait Details: steady with RW, no loss of balance, improved heel strike today   Stairs Stairs: Yes Stairs assistance: Min assist Stair Management: No rails, Step to pattern, Backwards, With walker Number of Stairs: 2 General stair comments: min assist to stabilize RW. cues for technique and sequence   Wheelchair Mobility    Modified Rankin (Stroke Patients Only)       Balance Overall balance assessment: Modified Independent                                          Cognition Arousal/Alertness: Awake/alert Behavior During Therapy: Anxious Overall Cognitive Status: Within Functional Limits for tasks assessed                                          Exercises Total Joint Exercises Ankle Circles/Pumps: AROM, Both, 15 reps Quad Sets: AROM, Both, 5 reps, Supine Short Arc Quad: AROM, Right, Supine, 5 reps Heel Slides: AAROM, AROM, Right, 10 reps Hip ABduction/ADduction: AAROM, Right, 10 reps Long Arc Quad: AROM, Right, 10 reps, Seated    General Comments        Pertinent Vitals/Pain Pain Assessment Pain Score: 4  Pain Location: right hip Pain Descriptors / Indicators: Sore Pain Intervention(s): Limited activity within patient's tolerance, Monitored during session, Premedicated before session, Ice  applied    Home Living                          Prior Function            PT Goals (current goals can now be found in the care plan section) Acute Rehab PT Goals Patient Stated Goal: less pain PT Goal Formulation: With patient Time For Goal Achievement: 12/08/22 Potential to Achieve Goals: Good Progress towards PT goals: Progressing toward goals    Frequency    7X/week      PT Plan Current plan remains  appropriate    Co-evaluation              AM-PAC PT "6 Clicks" Mobility   Outcome Measure  Help needed turning from your back to your side while in a flat bed without using bedrails?: None Help needed moving from lying on your back to sitting on the side of a flat bed without using bedrails?: A Little Help needed moving to and from a bed to a chair (including a wheelchair)?: None Help needed standing up from a chair using your arms (e.g., wheelchair or bedside chair)?: None Help needed to walk in hospital room?: None Help needed climbing 3-5 steps with a railing? : A Little 6 Click Score: 22    End of Session Equipment Utilized During Treatment: Gait belt Activity Tolerance: Patient tolerated treatment well Patient left: in chair;with call bell/phone within reach Nurse Communication: Mobility status PT Visit Diagnosis: Other abnormalities of gait and mobility (R26.89);Difficulty in walking, not elsewhere classified (R26.2)     Time: 8832-5498 PT Time Calculation (min) (ACUTE ONLY): 32 min  Charges:  $Gait Training: 8-22 mins $Therapeutic Exercise: 8-22 mins                    Blondell Reveal Kistler PT 12/03/2022  Acute Rehabilitation Services  Office [phone]

## 2022-12-03 NOTE — Discharge Summary (Signed)
Physician Discharge Summary  Patient ID: AYSE MCCARTIN MRN: [id number] DOB/AGE: 1971/05/18 52 y.o.  Admit date: 11/30/2022 Discharge date: 12/03/2022  Admission Diagnoses:  Osteoarthritis of right hip, unspecified osteoarthritis type  Discharge Diagnoses:  Principal Problem:   Osteoarthritis of right hip, unspecified osteoarthritis type   Past Medical History:  Diagnosis Date   Adenomyosis    Anxiety    Arthritis    Basal cell carcinoma 08/05/2021   R nasal sidewall, MOHs completed 09/23/21   Bipolar 1 disorder (HCC)    Chlamydia    as a teen   Dysplastic nevus 01/13/2010   left lat sacral, sacral, 0.4 x 0.3cm - DYSPLASTIC DYSPLASTIC JUNCTIONAL JUNCTIONAL NEVUS WITH MILD MELANOCYTIC MELANOCYTIC ATYPIA   Dysplastic nevus 01/13/2010   left ant axillary fold 0.3 cm - dysplastic compound nevus with mild melanocytic atypia   Endometriosis    Family history of adverse reaction to anesthesia    mother has mood changes after anesthesia   GERD (gastroesophageal reflux disease)    MRSA (methicillin resistant staph aureus) culture positive    PMDD (premenstrual dysphoric disorder)    Pneumonia    Pre-diabetes    SUI (stress urinary incontinence, female)     Surgeries: Procedure(s): TOTAL HIP ARTHROPLASTY on 11/30/2022   Consultants (if any):   Discharged Condition: Improved  Hospital Course: CHALISA KOBLER is an 52 y.o. female who was admitted 11/30/2022 with a diagnosis of Osteoarthritis of right hip, unspecified osteoarthritis type and went to the operating room on 11/30/2022 and underwent the above named procedures.    She was given perioperative antibiotics:  Anti-infectives (From admission, onward)    Start     Dose/Rate Route Frequency Ordered Stop   11/30/22 1800  ceFAZolin (ANCEF) IVPB 2g/100 mL premix        2 g 200 mL/hr over 30 Minutes Intravenous Every 6 hours 11/30/22 1716 12/01/22 0057   11/30/22 1230  ceFAZolin (ANCEF) IVPB 2g/100 mL premix        2 g 200  mL/hr over 30 Minutes Intravenous  Once 11/30/22 1228 11/30/22 1324   11/30/22 1045  vancomycin (VANCOCIN) IVPB 1000 mg/200 mL premix        1,000 mg 200 mL/hr over 60 Minutes Intravenous On call to O.R. 11/30/22 1041 11/30/22 1256     .  She was given sequential compression devices, early ambulation, and aspirin for DVT prophylaxis.  She benefited maximally from the hospital stay and there were no complications.    Recent vital signs:  Vitals:   12/02/22 2130 12/03/22 0515  BP: 120/64 (!) 99/54  Pulse: 82 84  Resp: 18 18  Temp: 98.6 F (37 C) 98.7 F (37.1 C)  SpO2: 100% 94%    Recent laboratory studies:  Lab Results  Component Value Date   HGB 8.5 (L) 12/02/2022   HGB 9.8 (L) 12/01/2022   HGB 11.3 (L) 11/30/2022   Lab Results  Component Value Date   WBC 8.3 12/02/2022   PLT 212 12/02/2022   No results found for: "INR" Lab Results  Component Value Date   NA 135 12/01/2022   K 3.9 12/01/2022   CL 104 12/01/2022   CO2 23 12/01/2022   BUN 17 12/01/2022   CREATININE 0.70 12/01/2022   GLUCOSE 164 (H) 12/01/2022    Discharge Medications:   Allergies as of 12/03/2022       Reactions   Codeine Anaphylaxis   Morphine Swelling   Vancomycin Other (See Comments)   Red Man  Syndrome   Clarithromycin    REACTION: Trouble breathing - stomach upset - bad taste in mouth   Erythromycin Base Other (See Comments)   Eye ointment - worsened conjunctivitis and swelling   Augmentin [amoxicillin-pot Clavulanate] Rash   Sulfamethoxazole-trimethoprim Rash        Medication List     TAKE these medications    acetaminophen 500 MG tablet Commonly known as: TYLENOL Take 2 tablets (1,000 mg total) by mouth every 8 (eight) hours as needed.   aspirin EC 81 MG tablet Take 1 tablet (81 mg total) by mouth 2 (two) times daily for 28 days. Swallow whole.   brompheniramine-pseudoephedrine-DM 30-2-10 MG/5ML syrup Take 5 mLs by mouth 4 (four) times daily as needed.   calcium  carbonate 500 MG chewable tablet Commonly known as: TUMS - dosed in mg elemental calcium Chew 3-4 tablets (1,500-2,000 mg total) by mouth daily as needed for indigestion or heartburn. What changed: how much to take   fluticasone 50 MCG/ACT nasal spray Commonly known as: FLONASE Place into both nostrils daily as needed for allergies or rhinitis.   ibuprofen 800 MG tablet Commonly known as: ADVIL Take 1 tablet (800 mg total) by mouth 3 (three) times daily. What changed:  medication strength how much to take when to take this reasons to take this   LORazepam 1 MG tablet Commonly known as: Ativan Take 1 tablet (1 mg total) by mouth 2 (two) times daily. Prn vertigo or anxiety What changed:  when to take this reasons to take this additional instructions   methocarbamol 500 MG tablet Commonly known as: ROBAXIN Take 1 tablet (500 mg total) by mouth every 8 (eight) hours as needed for up to 10 days for muscle spasms.   omeprazole 40 MG capsule Commonly known as: PRILOSEC Take 1 capsule (40 mg total) by mouth 2 (two) times daily before a meal. What changed:  when to take this additional instructions   ondansetron 4 MG disintegrating tablet Commonly known as: ZOFRAN-ODT Take 1 tablet (4 mg total) by mouth every 8 (eight) hours as needed for nausea or vomiting.   ondansetron 4 MG tablet Commonly known as: Zofran Take 1 tablet (4 mg total) by mouth every 8 (eight) hours as needed for up to 14 days for nausea or vomiting.   traMADol 50 MG tablet Commonly known as: Ultram Take 1-2 tablets (50-100 mg total) by mouth every 6 (six) hours as needed for up to 7 days.        Diagnostic Studies: DG HIP UNILAT WITH PELVIS 1V RIGHT  Result Date: 11/30/2022 CLINICAL DATA:  Postop right hip arthroplasty. EXAM: DG HIP (WITH OR WITHOUT PELVIS) 1V RIGHT COMPARISON:  Portable examination same date. FINDINGS: AP pelvis and cross-table lateral view of the right hip. Status post right total hip  arthroplasty with a screw fixed acetabular component. The hardware is well positioned. No evidence of acute fracture or dislocation. The soft tissues appear unremarkable. IMPRESSION: Status post right total hip arthroplasty without demonstrated complication. Electronically Signed   By: Richardean Sale M.D.   On: 11/30/2022 15:56   DG Pelvis Portable  Result Date: 11/30/2022 CLINICAL DATA:  Intraoperative right hip surgery EXAM: PORTABLE PELVIS 2 VIEWS COMPARISON:  None Available. FINDINGS: AP projection hip demonstrates right hip prosthesis in place. Otherwise grossly unremarkable limited study. IMPRESSION: Right hip prosthesis hardware noted. Electronically Signed   By: Sammie Bench M.D.   On: 11/30/2022 14:42    Disposition: Discharge disposition: 01-Home or Self Care  Discharge Instructions     Call MD / Call 911   Complete by: As directed    If you experience chest pain or shortness of breath, CALL 911 and be transported to the hospital emergency room.  If you develope a fever above 101 F, pus (white drainage) or increased drainage or redness at the wound, or calf pain, call your surgeon's office.   Constipation Prevention   Complete by: As directed    Drink plenty of fluids.  Prune juice may be helpful.  You may use a stool softener, such as Colace (over the counter) 100 mg twice a day.  Use MiraLax (over the counter) for constipation as needed.   Diet - low sodium heart healthy   Complete by: As directed    Increase activity slowly as tolerated   Complete by: As directed    Post-operative opioid taper instructions:   Complete by: As directed    POST-OPERATIVE OPIOID TAPER INSTRUCTIONS: It is important to wean off of your opioid medication as soon as possible. If you do not need pain medication after your surgery it is ok to stop day one. Opioids include: Codeine, Hydrocodone(Norco, Vicodin), Oxycodone(Percocet, oxycontin) and hydromorphone amongst others.  Long term and  even short term use of opiods can cause: Increased pain response Dependence Constipation Depression Respiratory depression And more.  Withdrawal symptoms can include Flu like symptoms Nausea, vomiting And more Techniques to manage these symptoms Hydrate well Eat regular healthy meals Stay active Use relaxation techniques(deep breathing, meditating, yoga) Do Not substitute Alcohol to help with tapering If you have been on opioids for less than two weeks and do not have pain than it is ok to stop all together.  Plan to wean off of opioids This plan should start within one week post op of your joint replacement. Maintain the same interval or time between taking each dose and first decrease the dose.  Cut the total daily intake of opioids by one tablet each day Next start to increase the time between doses. The last dose that should be eliminated is the evening dose.               Discharge Instructions      INSTRUCTIONS AFTER JOINT REPLACEMENT   Remove items at home which could result in a fall. This includes throw rugs or furniture in walking pathways ICE to the affected joint every three hours while awake for 30 minutes at a time, for at least the first 3-5 days, and then as needed for pain and swelling.  Continue to use ice for pain and swelling. You may notice swelling that will progress down to the foot and ankle.  This is normal after surgery.  Elevate your leg when you are not up walking on it.   Continue to use the breathing machine you got in the hospital (incentive spirometer) which will help keep your temperature down.  It is common for your temperature to cycle up and down following surgery, especially at night when you are not up moving around and exerting yourself.  The breathing machine keeps your lungs expanded and your temperature down.   DIET:  As you were doing prior to hospitalization, we recommend a well-balanced diet.  DRESSING / WOUND CARE /  SHOWERING  Keep the surgical dressing until follow up.  The dressing is water proof, so you can shower without any extra covering.  IF THE DRESSING FALLS OFF or the wound gets wet inside, change the dressing with  sterile gauze.  Please use good hand washing techniques before changing the dressing.  Do not use any lotions or creams on the incision until instructed by your surgeon.    ACTIVITY  Increase activity slowly as tolerated, but follow the weight bearing instructions below.   No driving for 6 weeks or until further direction given by your physician.  You cannot drive while taking narcotics.  No lifting or carrying greater than 10 lbs. until further directed by your surgeon. Avoid periods of inactivity such as sitting longer than an hour when not asleep. This helps prevent blood clots.  You may return to work once you are authorized by your doctor.     WEIGHT BEARING   Weight bearing as tolerated with assist device (walker, cane, etc) as directed, use it as long as suggested by your surgeon or therapist, typically at least 4-6 weeks.   EXERCISES  Results after joint replacement surgery are often greatly improved when you follow the exercise, range of motion and muscle strengthening exercises prescribed by your doctor. Safety measures are also important to protect the joint from further injury. Any time any of these exercises cause you to have increased pain or swelling, decrease what you are doing until you are comfortable again and then slowly increase them. If you have problems or questions, call your caregiver or physical therapist for advice.   Rehabilitation is important following a joint replacement. After just a few days of immobilization, the muscles of the leg can become weakened and shrink (atrophy).  These exercises are designed to build up the tone and strength of the thigh and leg muscles and to improve motion. Often times heat used for twenty to thirty minutes before working  out will loosen up your tissues and help with improving the range of motion but do not use heat for the first two weeks following surgery (sometimes heat can increase post-operative swelling).   These exercises can be done on a training (exercise) mat, on the floor, on a table or on a bed. Use whatever works the best and is most comfortable for you.    Use music or television while you are exercising so that the exercises are a pleasant break in your day. This will make your life better with the exercises acting as a break in your routine that you can look forward to.   Perform all exercises about fifteen times, three times per day or as directed.  You should exercise both the operative leg and the other leg as well.  Exercises include:   Quad Sets - Tighten up the muscle on the front of the thigh (Quad) and hold for 5-10 seconds.   Straight Leg Raises - With your knee straight (if you were given a brace, keep it on), lift the leg to 60 degrees, hold for 3 seconds, and slowly lower the leg.  Perform this exercise against resistance later as your leg gets stronger.  Leg Slides: Lying on your back, slowly slide your foot toward your buttocks, bending your knee up off the floor (only go as far as is comfortable). Then slowly slide your foot back down until your leg is flat on the floor again.  Angel Wings: Lying on your back spread your legs to the side as far apart as you can without causing discomfort.  Hamstring Strength:  Lying on your back, push your heel against the floor with your leg straight by tightening up the muscles of your buttocks.  Repeat, but this  time bend your knee to a comfortable angle, and push your heel against the floor.  You may put a pillow under the heel to make it more comfortable if necessary.   A rehabilitation program following joint replacement surgery can speed recovery and prevent re-injury in the future due to weakened muscles. Contact your doctor or a physical therapist  for more information on knee rehabilitation.    CONSTIPATION  Constipation is defined medically as fewer than three stools per week and severe constipation as less than one stool per week.  Even if you have a regular bowel pattern at home, your normal regimen is likely to be disrupted due to multiple reasons following surgery.  Combination of anesthesia, postoperative narcotics, change in appetite and fluid intake all can affect your bowels.   YOU MUST use at least one of the following options; they are listed in order of increasing strength to get the job done.  They are all available over the counter, and you may need to use some, POSSIBLY even all of these options:    Drink plenty of fluids (prune juice may be helpful) and high fiber foods Colace 100 mg by mouth twice a day  Senokot for constipation as directed and as needed Dulcolax (bisacodyl), take with full glass of water  Miralax (polyethylene glycol) once or twice a day as needed.  If you have tried all these things and are unable to have a bowel movement in the first 3-4 days after surgery call either your surgeon or your primary doctor.    If you experience loose stools or diarrhea, hold the medications until you stool forms back up.  If your symptoms do not get better within 1 week or if they get worse, check with your doctor.  If you experience "the worst abdominal pain ever" or develop nausea or vomiting, please contact the office immediately for further recommendations for treatment.   ITCHING:  If you experience itching with your medications, try taking only a single pain pill, or even half a pain pill at a time.  You can also use Benadryl over the counter for itching or also to help with sleep.   TED HOSE STOCKINGS:  Use stockings on both legs until for at least 2 weeks or as directed by physician office. They may be removed at night for sleeping.  MEDICATIONS:  See your medication summary on the "After Visit Summary" that  nursing will review with you.  You may have some home medications which will be placed on hold until you complete the course of blood thinner medication.  It is important for you to complete the blood thinner medication as prescribed.   Blood clot prevention (DVT Prophylaxis): After surgery you are at an increased risk for a blood clot. you were prescribed a blood thinner, Aspirin '81mg'$ , to be taken twice daily for a total of 4 weeks from surgery to help reduce your risk of getting a blood clot. This will help prevent a blood clot. Signs of a pulmonary embolus (blood clot in the lungs) include sudden short of breath, feeling lightheaded or dizzy, chest pain with a deep breath, rapid pulse rapid breathing. Signs of a blood clot in your arms or legs include new unexplained swelling and cramping, warm, red or darkened skin around the painful area. Please call the office or 911 right away if these signs or symptoms develop.  PRECAUTIONS:  If you experience chest pain or shortness of breath - call 911 immediately for transfer  to the hospital emergency department.   If you develop a fever greater that 101 F, purulent drainage from wound, increased redness or drainage from wound, foul odor from the wound/dressing, or calf pain - CONTACT YOUR SURGEON.                                                   FOLLOW-UP APPOINTMENTS:  If you do not already have a post-op appointment, please call the office for an appointment to be seen by your surgeon.  Guidelines for how soon to be seen are listed in your "After Visit Summary", but are typically between 2-3 weeks after surgery.  OTHER INSTRUCTIONS:     POST-OPERATIVE OPIOID TAPER INSTRUCTIONS: It is important to wean off of your opioid medication as soon as possible. If you do not need pain medication after your surgery it is ok to stop day one. Opioids include: Codeine, Hydrocodone(Norco, Vicodin), Oxycodone(Percocet, oxycontin) and hydromorphone amongst others.   Long term and even short term use of opiods can cause: Increased pain response Dependence Constipation Depression Respiratory depression And more.  Withdrawal symptoms can include Flu like symptoms Nausea, vomiting And more Techniques to manage these symptoms Hydrate well Eat regular healthy meals Stay active Use relaxation techniques(deep breathing, meditating, yoga) Do Not substitute Alcohol to help with tapering If you have been on opioids for less than two weeks and do not have pain than it is ok to stop all together.  Plan to wean off of opioids This plan should start within one week post op of your joint replacement. Maintain the same interval or time between taking each dose and first decrease the dose.  Cut the total daily intake of opioids by one tablet each day Next start to increase the time between doses. The last dose that should be eliminated is the evening dose.   MAKE SURE YOU:  Understand these instructions.  Get help right away if you are not doing well or get worse.    Thank you for letting us be a part of your medical care team.  It is a privilege we respect greatly.  We hope these instructions will help you stay on track for a fast and full recovery!           Signed: Josafat Enrico A Basim Bartnik 12/03/2022, 6:58 AM

## 2022-12-04 ENCOUNTER — Telehealth: Payer: Self-pay

## 2022-12-04 NOTE — Telephone Encounter (Signed)
Transition Care Management Unsuccessful Follow-up Telephone Call  Date of discharge and from where:  [facility]/03/22 [facility]  Attempts:  1st Attempt  Reason for unsuccessful TCM follow-up call:  Unable to reach patient

## 2022-12-07 NOTE — Telephone Encounter (Signed)
Transition Care Management Follow-up Telephone Call Date of discharge and from where: 12/03/22 [facility]  How have you been since you were released from the hospital? Anxiety is a little worse due to nervousness after the surgery. Taking Ativan as prescribed. Patient states,"I didn't think the recovery would be this long or bad." Pain level at 6/10. Taking Tramadol as prescribed. Reports the pain is manageable but just feels bad all over. First BM today since surgery; miralax was not working, took a laxative. Staying hydrated. Eating okay. Denies n/v/d. Spirometer in use as directed. Denies dizziness, fever, abd pain and all other worsening symptoms.  Dressing dry and intact. Any questions or concerns? No. Patient contacted surgeon and spoke with them on Friday. Reports physically doing better since then, but if she does not improve with feeling bad she will call PCP office for virtual visit. Declines scheduling today.   Items Reviewed: Did the pt receive and understand the discharge instructions provided? Yes  Medications obtained and verified? Yes  Other? Ted hose/socks worn daily. Any new allergies since your discharge? No  Dietary orders reviewed? Yes, low sodium heart healthy. Do you have support at home? Yes   Home Care and Equipment/Supplies: Were home health services ordered? no If so, what is the name of the agency? N/a  Has the agency set up a time to come to the patient's home? not applicable Were any new equipment or medical supplies ordered?  Walker in use.  What is the name of the medical supply agency? N/a Were you able to get the supplies/equipment? not applicable Do you have any questions related to the use of the equipment or supplies? No  Functional Questionnaire: (I = Independent and D = Dependent) ADLs: Family assist  Bathing/Dressing- Family assist  Meal Prep- Family assist  Eating- I  Maintaining continence- I  Transferring/Ambulation- Walker  Managing Meds-  Family assist as needed  Follow up appointments reviewed:  PCP Hospital f/u appt confirmed? No   Specialist Hospital f/u appt confirmed? No, agrees to call surgeon and schedule 2 week follow up tomorrow.  Are transportation arrangements needed? No  If their condition worsens, is the pt aware to call PCP or go to the Emergency Dept.? Yes Was the patient provided with contact information for the PCP's office or ED? Yes Was to pt encouraged to call back with questions or concerns? Yes

## 2022-12-09 ENCOUNTER — Emergency Department: Payer: 59

## 2022-12-09 ENCOUNTER — Other Ambulatory Visit: Payer: Self-pay

## 2022-12-09 ENCOUNTER — Emergency Department
Admission: EM | Admit: 2022-12-09 | Discharge: 2022-12-09 | Disposition: A | Discharge status: Home / Self Care | Payer: 59 | Attending: Emergency Medicine | Admitting: Emergency Medicine

## 2022-12-09 DIAGNOSIS — Z20822 Contact with and (suspected) exposure to covid-19: Secondary | ICD-10-CM | POA: Insufficient documentation

## 2022-12-09 DIAGNOSIS — F419 Anxiety disorder, unspecified: Secondary | ICD-10-CM | POA: Diagnosis not present

## 2022-12-09 DIAGNOSIS — K5900 Constipation, unspecified: Secondary | ICD-10-CM | POA: Diagnosis not present

## 2022-12-09 DIAGNOSIS — Z96643 Presence of artificial hip joint, bilateral: Secondary | ICD-10-CM | POA: Diagnosis not present

## 2022-12-09 DIAGNOSIS — M79661 Pain in right lower leg: Secondary | ICD-10-CM | POA: Diagnosis not present

## 2022-12-09 DIAGNOSIS — G8918 Other acute postprocedural pain: Secondary | ICD-10-CM | POA: Insufficient documentation

## 2022-12-09 LAB — CBC WITH DIFFERENTIAL/PLATELET
Abs Immature Granulocytes: 0.14 10*3/uL — ABNORMAL HIGH (ref 0.00–0.07)
Basophils Absolute: 0.1 10*3/uL (ref 0.0–0.1)
Basophils Relative: 1 %
Eosinophils Absolute: 0.4 10*3/uL (ref 0.0–0.5)
Eosinophils Relative: 6 %
HCT: 30.8 % — ABNORMAL LOW (ref 36.0–46.0)
Hemoglobin: 9.9 g/dL — ABNORMAL LOW (ref 12.0–15.0)
Immature Granulocytes: 2 %
Lymphocytes Relative: 23 %
Lymphs Abs: 1.8 10*3/uL (ref 0.7–4.0)
MCH: 32 pg (ref 26.0–34.0)
MCHC: 32.1 g/dL (ref 30.0–36.0)
MCV: 99.7 fL (ref 80.0–100.0)
Monocytes Absolute: 0.6 10*3/uL (ref 0.1–1.0)
Monocytes Relative: 7 %
Neutro Abs: 4.9 10*3/uL (ref 1.7–7.7)
Neutrophils Relative %: 61 %
Platelets: 426 10*3/uL — ABNORMAL HIGH (ref 150–400)
RBC: 3.09 MIL/uL — ABNORMAL LOW (ref 3.87–5.11)
RDW: 14.6 % (ref 11.5–15.5)
WBC: 7.9 10*3/uL (ref 4.0–10.5)
nRBC: 0 % (ref 0.0–0.2)

## 2022-12-09 LAB — COMPREHENSIVE METABOLIC PANEL
ALT: 19 U/L (ref 0–44)
AST: 25 U/L (ref 15–41)
Albumin: 3.8 g/dL (ref 3.5–5.0)
Alkaline Phosphatase: 67 U/L (ref 38–126)
Anion gap: 4 — ABNORMAL LOW (ref 5–15)
BUN: 22 mg/dL — ABNORMAL HIGH (ref 6–20)
CO2: 24 mmol/L (ref 22–32)
Calcium: 8.6 mg/dL — ABNORMAL LOW (ref 8.9–10.3)
Chloride: 108 mmol/L (ref 98–111)
Creatinine, Ser: 0.73 mg/dL (ref 0.44–1.00)
GFR, Estimated: >60 mL/min (ref >60)
Glucose, Bld: 111 mg/dL — ABNORMAL HIGH (ref 70–99)
Potassium: 4.2 mmol/L (ref 3.5–5.1)
Sodium: 136 mmol/L (ref 135–145)
Total Bilirubin: 0.9 mg/dL (ref 0.3–1.2)
Total Protein: 7.2 g/dL (ref 6.5–8.1)

## 2022-12-09 LAB — TROPONIN I (HIGH SENSITIVITY)
Troponin I (High Sensitivity): <2 ng/L (ref <18)
Troponin I (High Sensitivity): 2 ng/L (ref <18)

## 2022-12-09 LAB — RESP PANEL BY RT-PCR (RSV, FLU A&B, COVID)  RVPGX2
Influenza A by PCR: NEGATIVE
Influenza B by PCR: NEGATIVE
Resp Syncytial Virus by PCR: NEGATIVE
SARS Coronavirus 2 by RT PCR: NEGATIVE

## 2022-12-09 LAB — BRAIN NATRIURETIC PEPTIDE: B Natriuretic Peptide: 89 pg/mL (ref 0.0–100.0)

## 2022-12-09 LAB — PROTIME-INR
INR: 1 (ref 0.8–1.2)
Prothrombin Time: 13.2 seconds (ref 11.4–15.2)

## 2022-12-09 LAB — APTT: aPTT: 30 seconds (ref 24–36)

## 2022-12-09 MED ORDER — ONDANSETRON HCL 4 MG/2ML IJ SOLN
4.0000 mg | Freq: Once | INTRAMUSCULAR | Status: AC
  Administered 2022-12-09: 4 mg via INTRAVENOUS
  Filled 2022-12-09: qty 2

## 2022-12-09 MED ORDER — TRAMADOL HCL 50 MG PO TABS
50.0000 mg | ORAL_TABLET | Freq: Once | ORAL | Status: AC
  Administered 2022-12-09: 50 mg via ORAL
  Filled 2022-12-09: qty 1

## 2022-12-09 NOTE — ED Notes (Signed)
Pt in scans. Will redraw blue top once pt returns. Per lab, blue top is hemolyzed.

## 2022-12-09 NOTE — ED Notes (Signed)
Pt was helped to bathroom in wheelchair. Using walker at home (post hip surgery).

## 2022-12-09 NOTE — ED Triage Notes (Signed)
Pt comes with c/o right hip and leg pain. Pt stats some nausea. Pt states hip surgery was around 22nd. Pt states the lower leg pain that started on Monday.

## 2022-12-09 NOTE — ED Notes (Signed)
Pt back from ultrasound now. Blue top redrawn and sent.

## 2022-12-09 NOTE — Discharge Instructions (Signed)
Your workup was reassuring ultrasound was negative for DVT at this time I do not think there is any blood clot but if you develop worsening swelling of the leg worsening pain and worsening shortness of breath or any other concerns please return to the ER for repeat evaluation.  Take your pain medication as prescribed by your orthopedic doctor.  Please call them to make a follow-up appointment so they can reevaluate you.

## 2022-12-09 NOTE — ED Notes (Signed)
Pt appears anxious, worried about R leg pain (lateral and anterior leg) since Monday, worse since this AM, also complaining of R hip pain after hip surgery last week, has not been taking home pain meds because was afraid would stop breathing. Complains that it has been hard to sleep. Daughter at bedside. Respirations unlabored. Skin dry.

## 2022-12-09 NOTE — ED Provider Notes (Signed)
[facility] Provider Note    Event Date/Time   First MD Initiated Contact with Patient 12/09/22 [id number]     (approximate)   History   Post-op Problem   HPI  Lacey Decker is a 52 y.o. female otherwise healthy who had a total hip arthroplasty on 1/22 by Dr. Zachery Dakins in [place].  When asked patient why she came to our emergency room instead of going to [place] to see her surgeon she stated that her daughter had to drive her and that her daughter is not able to drive that far so they presented to [facility].  Patient is very tearful stating that she has been having a lot of pain in her right leg where she had the surgery.  However she has noticed increasing pain in the right shin and calf which made her more worried that there is something else going on.  She reports that she has not been taking her tramadol due to concerns for her not waking up from it.  And she also does report having some constipation issues related to it.  She reports taking tramadol previously and not having any other bad reactions although she does have a lot of reactions to other pain medications.  She reports that this morning around 5 then she started to feel really short of breath this lasted for few minutes and went away.  Patient is tearful and states that she just feels very anxious about everything since the surgery.  She does report being compliant with her baby aspirin daily.  Denies any other medical concerns.  She does reports not feeling well with nausea.     Physical Exam   Triage Vital Signs: ED Triage Vitals  Enc Vitals Group     BP 12/09/22 0712 (!) 140/82     Pulse Rate 12/09/22 0712 73     Resp 12/09/22 0712 18     Temp 12/09/22 0712 98 F (36.7 C)     Temp src --      SpO2 12/09/22 0712 98 %     Weight --      Height --      Head Circumference --      Peak Flow --      Pain Score 12/09/22 0710 6     Pain Loc --      Pain Edu? --      Excl. in [place]? --     Most  recent vital signs: Vitals:   12/09/22 0712  BP: (!) 140/82  Pulse: 73  Resp: 18  Temp: 98 F (36.7 C)  SpO2: 98%     General: Awake, no distress.  CV:  Good peripheral perfusion.  Resp:  Normal effort.  Abd:  No distention.  Soft nontender Other:  Right leg with some tenderness over the right tib-fib area.  No redness or erythema noted.  Good distal pulse. Dressing over the wound. NO redness or erythema noted near dressing.   ED Results / Procedures / Treatments   Labs (all labs ordered are listed, but only abnormal results are displayed) Labs Reviewed  RESP PANEL BY RT-PCR (RSV, FLU A&B, COVID)  [id number]  CBC WITH DIFFERENTIAL/PLATELET  COMPREHENSIVE METABOLIC PANEL  PROTIME-INR  APTT  BRAIN NATRIURETIC PEPTIDE  TROPONIN I (HIGH SENSITIVITY)     EKG  My interpretation of EKG:  EKG my potation's rate of 64 no ST elevation no T wave inversions, normal intervals  RADIOLOGY I have reviewed the xray  personally and interpreted and no pneumonia  PROCEDURES:  Critical Care performed: No  Procedures   MEDICATIONS ORDERED IN ED: Medications  ondansetron (ZOFRAN) injection 4 mg (has no administration in time range)  traMADol (ULTRAM) tablet 50 mg (has no administration in time range)     IMPRESSION / MDM / ASSESSMENT AND PLAN / ED COURSE  I reviewed the triage vital signs and the nursing notes.   Patient's presentation is most consistent with acute presentation with potential threat to life or bodily function.   Patient comes in with concern for some right leg pain but she has not been taking her muscle relaxers or tramadol.  She states being fearful of them.  She reports having a lot of anxiety since having the surgery.  Labs are ordered to evaluate for infection, x-rays evaluate for any issues with hardware, ultrasound evaluate for DVT.  Patient has good distal pulse.  She does report a brief episode of shortness of breath but this resolved within a few minutes.   Does not seem likely to be PE given symptoms have resolved no tachycardia no low oxygen levels but will consider further workup based upon ultrasound.  Patient was given her tramadol and Zofran to help with symptoms  Troponins are negative x 2 COVID, flu are negative.  Hemoglobin is uptrending.  White count is normal no suspicion for infection given normal white count no fevers no erythema or redness noted on the leg CMP reassuring.  Ultrasound negative for DVT.  X-rays were reassuring.  Reevaluated patient feels much better after the tramadol.  Explained that she was prescribed these medications for her postop pain is important to take them.  Patient does report feeling really anxious about a lot of things I think this is contributing to her symptoms.  We discussed CT PE but of opted to hold off after discussion of benefits and risk given patient now has resolution of shortness of breath and only last a few minutes.  However she does understand that if symptoms are changing or worsening she would need to come into the ER for repeat evaluation as a blood clot can form at any time and my suspicion at this time however is very low.  consider admission for patient but given reassuring workup patient is cleared for discharge home    Final diagnoses:  Post-operative pain  Anxiety     Rx / DC Orders   ED Discharge Orders     None        Note:  This document was prepared using Dragon voice recognition software and may include unintentional dictation errors.   Vanessa [place], MD 12/09/22 [phone]

## 2022-12-10 ENCOUNTER — Telehealth: Payer: Self-pay

## 2022-12-10 NOTE — Telephone Encounter (Signed)
Transition Care Management Follow-up Telephone Call Date of discharge and from where:TCM DC [facility] ER 12-09-22 Dx: post-op pain  How have you been since you were released from the hospital? Feeling some better  Any questions or concerns? No  Items Reviewed: Did the pt receive and understand the discharge instructions provided? Yes  Medications obtained and verified? Yes  Other? No  Any new allergies since your discharge? No  Dietary orders reviewed? Yes Do you have support at home? Yes   Home Care and Equipment/Supplies: Were home health services ordered? no If so, what is the name of the agency? na  Has the agency set up a time to come to the patient's home? not applicable Were any new equipment or medical supplies ordered?  No What is the name of the medical supply agency? na Were you able to get the supplies/equipment? not applicable Do you have any questions related to the use of the equipment or supplies? No  Functional Questionnaire: (I = Independent and D = Dependent) ADLs: I- WITH ASSISTANCE   Bathing/Dressing- I- WITH ASSISTANCE   Meal Prep- I  Eating- I  Maintaining continence- I  Transferring/Ambulation- I- WALKER   Managing Meds- I  Follow up appointments reviewed:  PCP Hospital f/u appt confirmed? No  . [facility] f/u appt confirmed? Yes  Patient follows with surgeon . Are transportation arrangements needed? No  If their condition worsens, is the pt aware to call PCP or go to the Emergency Dept.? Yes Was the patient provided with contact information for the PCP's office or ED? Yes Was to pt encouraged to call back with questions or concerns? Yes   Juanda Crumble LPN [facility] [phone]

## 2022-12-24 ENCOUNTER — Other Ambulatory Visit: Payer: Self-pay | Admitting: Family Medicine

## 2022-12-24 DIAGNOSIS — Z1231 Encounter for screening mammogram for malignant neoplasm of breast: Secondary | ICD-10-CM

## 2023-01-12 ENCOUNTER — Ambulatory Visit
Admission: RE | Admit: 2023-01-12 | Discharge: 2023-01-12 | Disposition: A | Discharge status: Home / Self Care | Payer: 59 | Source: Ambulatory Visit | Attending: Family Medicine | Admitting: Family Medicine

## 2023-01-12 DIAGNOSIS — Z1231 Encounter for screening mammogram for malignant neoplasm of breast: Secondary | ICD-10-CM | POA: Diagnosis not present

## 2023-03-22 ENCOUNTER — Other Ambulatory Visit: Payer: Self-pay | Admitting: *Deleted

## 2023-03-22 MED ORDER — FLUTICASONE PROPIONATE 50 MCG/ACT NA SUSP: 2.0000 | Freq: Every day | NASAL | 0 refills | Status: DC | PRN

## 2023-03-22 NOTE — Telephone Encounter (Signed)
Contacted patient to schedule appointment, relayed message from above. Patient stated she would call back at a later date to schedule.

## 2023-03-22 NOTE — Telephone Encounter (Signed)
Please call and schedule CPE with fasting labs prior with Dr. Bedsole. 

## 2023-04-14 ENCOUNTER — Ambulatory Visit: Payer: 59 | Admitting: Clinical

## 2023-04-23 ENCOUNTER — Encounter: Payer: Self-pay | Admitting: Family Medicine

## 2023-04-23 ENCOUNTER — Ambulatory Visit (INDEPENDENT_AMBULATORY_CARE_PROVIDER_SITE_OTHER): Payer: 59 | Admitting: Family Medicine

## 2023-04-23 VITALS — BP 148/76 | HR 78 | Temp 98.6°F | Ht 65.5 in | Wt 222.4 lb

## 2023-04-23 DIAGNOSIS — R5383 Other fatigue: Secondary | ICD-10-CM | POA: Diagnosis not present

## 2023-04-23 DIAGNOSIS — Z13220 Encounter for screening for lipoid disorders: Secondary | ICD-10-CM | POA: Diagnosis not present

## 2023-04-23 DIAGNOSIS — Z23 Encounter for immunization: Secondary | ICD-10-CM | POA: Diagnosis not present

## 2023-04-23 DIAGNOSIS — Z0000 Encounter for general adult medical examination without abnormal findings: Secondary | ICD-10-CM | POA: Diagnosis not present

## 2023-04-23 DIAGNOSIS — F310 Bipolar disorder, current episode hypomanic: Secondary | ICD-10-CM

## 2023-04-23 DIAGNOSIS — R7303 Prediabetes: Secondary | ICD-10-CM

## 2023-04-23 DIAGNOSIS — F411 Generalized anxiety disorder: Secondary | ICD-10-CM

## 2023-04-23 MED ORDER — LORAZEPAM 1 MG PO TABS: 1.0000 mg | ORAL_TABLET | Freq: Two times a day (BID) | ORAL | 0 refills | Status: DC

## 2023-04-23 NOTE — Progress Notes (Signed)
Patient ID: Lacey Decker, female    DOB: [birth year], 52 y.o.   MRN: [id number]  This visit was conducted in person.  BP (!) 148/76   Pulse 78   Temp 98.6 F (37 C) (Oral)   Ht 5' 5.5" (1.664 m)   Wt 222 lb 6.4 oz (100.9 kg)   SpO2 97%   BMI 36.45 kg/m    CC: Chief Complaint  Patient presents with   Annual Exam    Subjective:   HPI: Lacey Decker is a 52 y.o. female presenting on 04/23/2023 for Annual Exam  The patient presents for complete physical and review of chronic health problems. He/She also has the following acute concerns today:  Increased anxiety  Wt Readings from Last 3 Encounters:  04/23/23 222 lb 6.4 oz (100.9 kg)  11/30/22 213 lb (96.6 kg)  11/18/22 213 lb (96.6 kg)  Body mass index is 36.45 kg/m.   Bipolar disorder, Anxiety, MDD:    She is not currently treated for bipolar do.. she does not wish to see psych at this point ad refuses any meds.  She request ativan to use prn panic attacks..   Right hip replacement in last year... S/P PT  Plans to start elliptical.   GERD.Marland Kitchen poor control.Marland Kitchen  omeprazole 40 mg not helpful. Endoscopy:  nml 2021 Relevant past medical, surgical, family and social history reviewed and updated as indicated. Interim medical history since our last visit reviewed. Allergies and medications reviewed and updated. Outpatient Medications Prior to Visit  Medication Sig Dispense Refill   calcium carbonate (TUMS - DOSED IN MG ELEMENTAL CALCIUM) 500 MG chewable tablet Chew 3-4 tablets (1,500-2,000 mg total) by mouth daily as needed for indigestion or heartburn.     fluticasone (FLONASE) 50 MCG/ACT nasal spray Place 2 sprays into both nostrils daily as needed for allergies or rhinitis. 48 mL 0   omeprazole (PRILOSEC) 40 MG capsule Take 1 capsule (40 mg total) by mouth 2 (two) times daily before a meal. (Patient taking differently: Take 40 mg by mouth See admin instructions. Take 40 mg daily, may take a second 40 mg dose as needed for  heartburn) 180 capsule 3   LORazepam (ATIVAN) 1 MG tablet Take 1 tablet (1 mg total) by mouth 2 (two) times daily. Prn vertigo or anxiety (Patient not taking: Reported on 04/23/2023) 15 tablet 0   brompheniramine-pseudoephedrine-DM 30-2-10 MG/5ML syrup Take 5 mLs by mouth 4 (four) times daily as needed. 120 mL 0   ibuprofen (ADVIL) 800 MG tablet Take 1 tablet (800 mg total) by mouth 3 (three) times daily. (Patient not taking: Reported on 04/23/2023) 30 tablet 0   ondansetron (ZOFRAN-ODT) 4 MG disintegrating tablet Take 1 tablet (4 mg total) by mouth every 8 (eight) hours as needed for nausea or vomiting. (Patient not taking: Reported on 11/16/2022) 12 tablet 0   No facility-administered medications prior to visit.     Per HPI unless specifically indicated in ROS section below Review of Systems  Constitutional:  Negative for fatigue and fever.  HENT:  Negative for congestion.   Eyes:  Negative for pain.  Respiratory:  Negative for cough and shortness of breath.   Cardiovascular:  Negative for chest pain, palpitations and leg swelling.  Gastrointestinal:  Negative for abdominal pain.  Genitourinary:  Negative for dysuria and vaginal bleeding.  Musculoskeletal:  Negative for back pain.  Neurological:  Negative for syncope, light-headedness and headaches.  Psychiatric/Behavioral:  Negative for dysphoric mood.    Objective:  BP (!) 148/76   Pulse 78   Temp 98.6 F (37 C) (Oral)   Ht 5' 5.5" (1.664 m)   Wt 222 lb 6.4 oz (100.9 kg)   SpO2 97%   BMI 36.45 kg/m   Wt Readings from Last 3 Encounters:  04/23/23 222 lb 6.4 oz (100.9 kg)  11/30/22 213 lb (96.6 kg)  11/18/22 213 lb (96.6 kg)      Physical Exam    Results for orders placed or performed during the hospital encounter of 12/09/22  Resp panel by RT-PCR (RSV, Flu A&B, Covid) Anterior Nasal Swab   Specimen: Anterior Nasal Swab  Result Value Ref Range   SARS Coronavirus 2 by RT PCR NEGATIVE NEGATIVE   Influenza A by PCR NEGATIVE  NEGATIVE   Influenza B by PCR NEGATIVE NEGATIVE   Resp Syncytial Virus by PCR NEGATIVE NEGATIVE  CBC with Differential  Result Value Ref Range   WBC 7.9 4.0 - 10.5 K/uL   RBC 3.09 (L) 3.87 - 5.11 MIL/uL   Hemoglobin 9.9 (L) 12.0 - 15.0 g/dL   HCT 16.1 (L) 09.6 - 04.5 %   MCV 99.7 80.0 - 100.0 fL   MCH 32.0 26.0 - 34.0 pg   MCHC 32.1 30.0 - 36.0 g/dL   RDW 40.9 81.1 - 91.4 %   Platelets 426 (H) 150 - 400 K/uL   nRBC 0.0 0.0 - 0.2 %   Neutrophils Relative % 61 %   Neutro Abs 4.9 1.7 - 7.7 K/uL   Lymphocytes Relative 23 %   Lymphs Abs 1.8 0.7 - 4.0 K/uL   Monocytes Relative 7 %   Monocytes Absolute 0.6 0.1 - 1.0 K/uL   Eosinophils Relative 6 %   Eosinophils Absolute 0.4 0.0 - 0.5 K/uL   Basophils Relative 1 %   Basophils Absolute 0.1 0.0 - 0.1 K/uL   Immature Granulocytes 2 %   Abs Immature Granulocytes 0.14 (H) 0.00 - 0.07 K/uL  Comprehensive metabolic panel  Result Value Ref Range   Sodium 136 135 - 145 mmol/L   Potassium 4.2 3.5 - 5.1 mmol/L   Chloride 108 98 - 111 mmol/L   CO2 24 22 - 32 mmol/L   Glucose, Bld 111 (H) 70 - 99 mg/dL   BUN 22 (H) 6 - 20 mg/dL   Creatinine, Ser 7.82 0.44 - 1.00 mg/dL   Calcium 8.6 (L) 8.9 - 10.3 mg/dL   Total Protein 7.2 6.5 - 8.1 g/dL   Albumin 3.8 3.5 - 5.0 g/dL   AST 25 15 - 41 U/L   ALT 19 0 - 44 U/L   Alkaline Phosphatase 67 38 - 126 U/L   Total Bilirubin 0.9 0.3 - 1.2 mg/dL   GFR, Estimated >95 >62 mL/min   Anion gap 4 (L) 5 - 15  Brain natriuretic peptide  Result Value Ref Range   B Natriuretic Peptide 89.0 0.0 - 100.0 pg/mL  Protime-INR  Result Value Ref Range   Prothrombin Time 13.2 11.4 - 15.2 seconds   INR 1.0 0.8 - 1.2  APTT  Result Value Ref Range   aPTT 30 24 - 36 seconds  Troponin I (High Sensitivity)  Result Value Ref Range   Troponin I (High Sensitivity) <2 <18 ng/L  Troponin I (High Sensitivity)  Result Value Ref Range   Troponin I (High Sensitivity) 2 <18 ng/L    This visit occurred during the SARS-CoV-2  public health emergency.  Safety protocols were in place, including screening questions prior to the  visit, additional usage of staff PPE, and extensive cleaning of exam room while observing appropriate contact time as indicated for disinfecting solutions.   COVID 19 screen:  No recent travel or known exposure to COVID19 The patient denies respiratory symptoms of COVID 19 at this time. The importance of social distancing was discussed today.   Assessment and Plan The patient's preventative maintenance and recommended screening tests for an annual wellness exam were reviewed in full today. Brought up to date unless services declined.  Counselled on the importance of diet, exercise, and its role in overall health and mortality. The patient's FH and SH was reviewed, including their home life, tobacco status, and drug and alcohol status.   Vaccines: Due for COVID, tdap and  shingrix #2  day Pap/DVE:     per GYN Mammo:  01/2023 Bone Density:not indicated Colon: 01/2020  Smoking Status: none ETOH/ drug use: none/none  HIV screen:   completed in past   Problem List Items Addressed This Visit   None   [name], MD

## 2023-04-24 LAB — COMPREHENSIVE METABOLIC PANEL
AG Ratio: 1.7 (calc) (ref 1.0–2.5)
ALT: 31 U/L — ABNORMAL HIGH (ref 6–29)
AST: 23 U/L (ref 10–35)
Albumin: 4.5 g/dL (ref 3.6–5.1)
Alkaline phosphatase (APISO): 72 U/L (ref 37–153)
BUN: 23 mg/dL (ref 7–25)
CO2: 23 mmol/L (ref 20–32)
Calcium: 9.9 mg/dL (ref 8.6–10.4)
Chloride: 101 mmol/L (ref 98–110)
Creat: 0.81 mg/dL (ref 0.50–1.03)
Globulin: 2.6 g/dL (calc) (ref 1.9–3.7)
Glucose, Bld: 91 mg/dL (ref 65–99)
Potassium: 4.6 mmol/L (ref 3.5–5.3)
Sodium: 138 mmol/L (ref 135–146)
Total Bilirubin: 0.6 mg/dL (ref 0.2–1.2)
Total Protein: 7.1 g/dL (ref 6.1–8.1)

## 2023-04-24 LAB — HEMOGLOBIN A1C
Hgb A1c MFr Bld: 6.2 % of total Hgb — ABNORMAL HIGH (ref <5.7)
Mean Plasma Glucose: 131 mg/dL
eAG (mmol/L): 7.3 mmol/L

## 2023-04-24 LAB — LIPID PANEL
Cholesterol: 176 mg/dL (ref <200)
HDL: 48 mg/dL — ABNORMAL LOW (ref >=50)
LDL Cholesterol (Calc): 108 mg/dL (calc) — ABNORMAL HIGH
Non-HDL Cholesterol (Calc): 128 mg/dL (calc) (ref <130)
Total CHOL/HDL Ratio: 3.7 (calc) (ref <5.0)
Triglycerides: 106 mg/dL (ref <150)

## 2023-04-24 LAB — CBC WITH DIFFERENTIAL/PLATELET
Absolute Monocytes: 585 cells/uL (ref 200–950)
Basophils Absolute: 39 cells/uL (ref 0–200)
Basophils Relative: 0.5 %
Eosinophils Absolute: 162 cells/uL (ref 15–500)
Eosinophils Relative: 2.1 %
HCT: 41.3 % (ref 35.0–45.0)
Hemoglobin: 13.8 g/dL (ref 11.7–15.5)
Lymphs Abs: 2379 cells/uL (ref 850–3900)
MCH: 30.9 pg (ref 27.0–33.0)
MCHC: 33.4 g/dL (ref 32.0–36.0)
MCV: 92.6 fL (ref 80.0–100.0)
MPV: 10.3 fL (ref 7.5–12.5)
Monocytes Relative: 7.6 %
Neutro Abs: 4535 cells/uL (ref 1500–7800)
Neutrophils Relative %: 58.9 %
Platelets: 354 10*3/uL (ref 140–400)
RBC: 4.46 10*6/uL (ref 3.80–5.10)
RDW: 13.4 % (ref 11.0–15.0)
Total Lymphocyte: 30.9 %
WBC: 7.7 10*3/uL (ref 3.8–10.8)

## 2023-04-24 LAB — VITAMIN D 25 HYDROXY (VIT D DEFICIENCY, FRACTURES): Vit D, 25-Hydroxy: 39 ng/mL (ref 30–100)

## 2023-04-24 LAB — VITAMIN B12: Vitamin B-12: 362 pg/mL (ref 200–1100)

## 2023-04-24 LAB — TSH: TSH: 0.98 mIU/L

## 2023-04-24 LAB — MAGNESIUM: Magnesium: 1.9 mg/dL (ref 1.5–2.5)

## 2023-04-28 NOTE — Assessment & Plan Note (Signed)
Chronic, poor control.  Encouraged her to continue counseling.  She refuses trial of SSRI.  She does state limited use of Ativan is helpful and I have refilled this for her today.

## 2023-04-28 NOTE — Assessment & Plan Note (Signed)
Chronic, not on medication or followed by psychiatrist at this point.  Given past experience she is not interested in either referral to psychiatry or medication.

## 2023-05-12 ENCOUNTER — Ambulatory Visit (INDEPENDENT_AMBULATORY_CARE_PROVIDER_SITE_OTHER): Payer: 59 | Admitting: Clinical

## 2023-05-12 DIAGNOSIS — F319 Bipolar disorder, unspecified: Secondary | ICD-10-CM | POA: Diagnosis not present

## 2023-05-12 DIAGNOSIS — F4310 Post-traumatic stress disorder, unspecified: Secondary | ICD-10-CM

## 2023-05-12 DIAGNOSIS — F411 Generalized anxiety disorder: Secondary | ICD-10-CM

## 2023-05-12 NOTE — Progress Notes (Unsigned)
                Margaurite Salido, LCSW 
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
## 2023-05-17 ENCOUNTER — Telehealth (INDEPENDENT_AMBULATORY_CARE_PROVIDER_SITE_OTHER): Payer: 59 | Admitting: Family Medicine

## 2023-05-17 ENCOUNTER — Telehealth: Payer: Self-pay

## 2023-05-17 ENCOUNTER — Encounter: Payer: Self-pay | Admitting: Family Medicine

## 2023-05-17 DIAGNOSIS — L304 Erythema intertrigo: Secondary | ICD-10-CM

## 2023-05-17 MED ORDER — NYSTATIN 100000 UNIT/GM EX CREA: 1.0000 | TOPICAL_CREAM | Freq: Two times a day (BID) | CUTANEOUS | 0 refills | Status: DC

## 2023-05-17 NOTE — Telephone Encounter (Signed)
I spoke with pt; pt requesting a VV because she cannot leave work. Pt scheduled VV with Dr. Abbe Amsterdam at [facility] 05/17/23 at 9 AM. Sending note to Dr Salomon Fick.

## 2023-05-17 NOTE — Progress Notes (Signed)
Virtual Visit via Video Note  I connected with Lacey [facility]cker on 05/17/23 at  9:00 AM EDT by a video enabled telemedicine application and verified that I am speaking with the correct person using two identifiers.  Location patient: home Location provider:work or home office Persons participating in the virtual visit: patient, provider  I discussed the limitations of evaluation and management by telemedicine and the availability of in person appointments. The patient expressed understanding and agreed to proceed.   Chief Complaint  Patient presents with   Rash    Under breast and in between, noticed it last Tuesday. Raw and red, and burning.     HPI:  Pt is 52 yo female who is seen by Kem Boroughs, MD and presents with acute concern.  Pt has a rash between breast x 6 days.  Notes increased sweating.  Rash, red is burning.  Tried Lume deodorant and cornstarch.  Nurse line told her to put triple abx ointment on area which did not help. Pt states that a similar rash in the past and was told it was yeast.  Patient concerned rash will return when she starts working out.  Patient gained 50 pounds after hip surgery.  ROS: See pertinent positives and negatives per HPI.  Past Medical History:  Diagnosis Date   Adenomyosis    Anxiety    Arthritis    Basal cell carcinoma 08/05/2021   R nasal sidewall, MOHs completed 09/23/21   Bipolar 1 disorder (HCC)    Chlamydia    as a teen   Dysplastic nevus 01/13/2010   left lat sacral, sacral, 0.4 x 0.3cm - DYSPLASTIC DYSPLASTIC JUNCTIONAL JUNCTIONAL NEVUS WITH MILD MELANOCYTIC MELANOCYTIC ATYPIA   Dysplastic nevus 01/13/2010   left ant axillary fold 0.3 cm - dysplastic compound nevus with mild melanocytic atypia   Endometriosis    Family history of adverse reaction to anesthesia    mother has mood changes after anesthesia   GERD (gastroesophageal reflux disease)    MRSA (methicillin resistant staph aureus) culture positive    PMDD (premenstrual  dysphoric disorder)    Pneumonia    Pre-diabetes    SUI (stress urinary incontinence, female)     Past Surgical History:  Procedure Laterality Date   ABDOMINAL HYSTERECTOMY  2009   partial lap hyst, LSO due to adenomyosis/endometriosis   btl  11/09/1998   COLONOSCOPY WITH PROPOFOL N/A 01/10/2020   Procedure: COLONOSCOPY WITH PROPOFOL;  Surgeon: Toney Reil, MD;  Location: [facility] ENDOSCOPY;  Service: Gastroenterology;  Laterality: N/A;   DILATION AND CURETTAGE OF UTERUS  1997   after miscarriage   ESOPHAGOGASTRODUODENOSCOPY (EGD) WITH PROPOFOL N/A 01/10/2020   Procedure: ESOPHAGOGASTRODUODENOSCOPY (EGD) WITH PROPOFOL;  Surgeon: Toney Reil, MD;  Location: [facility] ENDOSCOPY;  Service: Gastroenterology;  Laterality: N/A;   LEEP  2000   misscarriage     x5   MOHS SURGERY     Nose   NEUROMA SURGERY     lipoma removal Right labia   nsvd     3   TOTAL HIP ARTHROPLASTY Right 11/30/2022   Procedure: TOTAL HIP ARTHROPLASTY;  Surgeon: Joen Laura, MD;  Location: [facility];  Service: Orthopedics;  Laterality: Right;    Family History  Problem Relation Age of Onset   Hypertension Mother    Hyperlipidemia Mother    Depression Mother    Basal cell carcinoma Mother    Colon polyps Mother    Bipolar disorder Sister    Depression Maternal Grandmother  Lung cancer Maternal Grandfather    Breast cancer Neg Hx      Current Outpatient Medications:    calcium carbonate (TUMS - DOSED IN MG ELEMENTAL CALCIUM) 500 MG chewable tablet, Chew 3-4 tablets (1,500-2,000 mg total) by mouth daily as needed for indigestion or heartburn., Disp: , Rfl:    fluticasone (FLONASE) 50 MCG/ACT nasal spray, Place 2 sprays into both nostrils daily as needed for allergies or rhinitis., Disp: 48 mL, Rfl: 0   LORazepam (ATIVAN) 1 MG tablet, Take 1 tablet (1 mg total) by mouth 2 (two) times daily. Prn vertigo or anxiety, Disp: 30 tablet, Rfl: 0   omeprazole (PRILOSEC) 40 MG capsule, Take 1 capsule  (40 mg total) by mouth 2 (two) times daily before a meal. (Patient taking differently: Take 40 mg by mouth See admin instructions. Take 40 mg daily, may take a second 40 mg dose as needed for heartburn), Disp: 180 capsule, Rfl: 3  EXAM:  VITALS per patient if applicable: RR between 12-20 bpm  GENERAL: alert, oriented, appears well and in no acute distress  HEENT: atraumatic, conjunctiva clear, no obvious abnormalities on inspection of external nose and ears  NECK: normal movements of the head and neck  LUNGS: on inspection no signs of respiratory distress, breathing rate appears normal, no obvious gross SOB, gasping or wheezing  CV: no obvious cyanosis  Skin: Warm dry, intact.  Midline sternum between breast with erythematous area, several lumps noted in upper portion of rash.  Erythema underneath right breast.  No drainage noted.  MS: moves all visible extremities without noticeable abnormality  PSYCH/NEURO: pleasant and cooperative, no obvious depression or anxiety, speech and thought processing grossly intact  ASSESSMENT AND PLAN:  Discussed the following assessment and plan:  Intertrigo - Plan: nystatin cream (MYCOSTATIN)  Start nystatin cream for Candida of skin likely 2/2 increased sweating.  Discussed supportive care including wearing moisture wicking fabrics, powder, using blow dryer on cool setting after showering, etc.  Follow-up as needed for continued or worsening symptoms.   I discussed the assessment and treatment plan with the patient. The patient was provided an opportunity to ask questions and all were answered. The patient agreed with the plan and demonstrated an understanding of the instructions.   The patient was advised to call back or seek an in-person evaluation if the symptoms worsen or if the condition fails to improve as anticipated.   Deeann Saint, MD

## 2023-05-19 ENCOUNTER — Ambulatory Visit (INDEPENDENT_AMBULATORY_CARE_PROVIDER_SITE_OTHER): Payer: 59 | Admitting: Clinical

## 2023-05-19 DIAGNOSIS — F319 Bipolar disorder, unspecified: Secondary | ICD-10-CM | POA: Diagnosis not present

## 2023-05-19 DIAGNOSIS — F411 Generalized anxiety disorder: Secondary | ICD-10-CM | POA: Diagnosis not present

## 2023-05-19 DIAGNOSIS — F4310 Post-traumatic stress disorder, unspecified: Secondary | ICD-10-CM | POA: Diagnosis not present

## 2023-05-19 NOTE — Progress Notes (Signed)
                Klani Caridi, LCSW 

## 2023-05-19 NOTE — Progress Notes (Signed)
[facility] Counselor/Therapist Progress Note  Patient ID: Lacey Decker, MRN: [id number],    Date: 05/19/2023  Time Spent: 12:34pm - 1:28pm : 54 minutes   Treatment Type: Individual Therapy  Reported Symptoms: Patient reported feeling anxious, experiencing anger outbursts, impulsivity, intrusive thoughts  Mental Status Exam: Appearance:  Neat and Well Groomed     Behavior: Appropriate  Motor: Normal  Speech/Language:  Clear and Coherent  Affect: Tearful  Mood: anxious and sad  Thought process: tangential  Thought content:   Tangential  Sensory/Perceptual disturbances:   WNL  Orientation: oriented to person, place, and situation  Attention: Good  Concentration: Good  Memory: WNL  Fund of knowledge:  Good  Insight:   Fair  Judgment:  Fair  Impulse Control: Fair   Risk Assessment: Danger to Self:  No Patient denied current suicidal ideation  Self-injurious Behavior: No Danger to Others: No Patient denied current homicidal ideation Duty to Warn:no Physical Aggression / Violence:No  Access to Firearms a concern: No  Gang Involvement:No   Subjective: Patient reported no changes since last session. Patient reported feeling anxious today and stated, "my mood's always anxious". Patient reported a history of treatment with Dr. Kathrynn Running through [facility] and patient reported at the time she was experiencing "real bad panic states" that lasted for hours at a time. Patient reported an increase in difficulty controlling patient's impulses in patient's early 20's. Patient reported she continues to experience anger outbursts but stated, "they're way more controlled". Patient stated, "I feel like I'm not meant to be anything more". Patient reported a negative perception of herself. Patient reported experiencing intrusive thoughts related to previous trauma. Patient stated, "my brain wants me to but it doesn't want me to" in response to referral to a psychiatrist  for medication management consultation. Patient reported she has experienced multiple side effects of medications and stated, "I don't know what it's going to do to me". Patient stated, "my decision making is so bad right now" and stated, "I just can't make choices, I feel like I'm going to make the wrong choice". Patient reported she is open to a referral to clinician that specializes in treatment of trauma and inquired about information for a grief/loss support group. Patient reported a preference for a referral to a provider the [place], [place] area.   Interventions: Clinician conducted session in person at clinician's office at [facility]. Clinician reviewed diagnoses and treatment recommendations. Provided psycho education related to diagnoses and treatment. Provided psycho education related to clinician's scope of practice and provided referral information for local resources for grief/loss support groups and referral information for local providers that specialize in treatment of trauma.   Collaboration of Care: Other discussed consent required for referral   Diagnosis:PTSD (post-traumatic stress disorder)  Bipolar I disorder (HCC)  Generalized anxiety disorder  Plan: Clinician will initiate a referral to a provider that specializes in treatment of trauma.   Doree Barthel, LCSW

## 2023-06-18 ENCOUNTER — Telehealth: Payer: Self-pay | Admitting: Family Medicine

## 2023-06-18 MED ORDER — ESOMEPRAZOLE MAGNESIUM 40 MG PO CPDR: 40.0000 mg | DELAYED_RELEASE_CAPSULE | Freq: Every day | ORAL | 0 refills | Status: DC

## 2023-06-18 MED ORDER — ESOMEPRAZOLE MAGNESIUM 40 MG PO CPDR: 40.0000 mg | DELAYED_RELEASE_CAPSULE | Freq: Every day | ORAL | 3 refills | Status: DC

## 2023-06-18 NOTE — Addendum Note (Signed)
Addended by: [name] E on: 06/18/2023 02:05 PM   Modules accepted: Orders

## 2023-06-18 NOTE — Telephone Encounter (Signed)
[facility] notified as instructed by telephone.  She ask that we also send in a 90 day supply to [facility].  Rx sent to mail order as requested.  Medication list updated.

## 2023-06-18 NOTE — Telephone Encounter (Signed)
Call patient.  She can stop Prilosec 40 mg daily.  Prescription sent for Nexium 40 mg p.o. daily.  If no improvement in symptoms and 2 to 4 weeks have her let me know and we can make a referral for GI for further evaluation

## 2023-06-18 NOTE — Telephone Encounter (Signed)
Pt called stating she was told to let Dr. Ermalene Searing if her heart burn is continuous after being on the meds, omeprazole (PRILOSEC) 40 MG capsule [[id number]] . Pt states she's still experiencing heart burn & had discussed with Dr. Ermalene Searing about trying another meds for heart burn. Pt states she used to take esomeprazole (Nexium) & it worked good. Preferred pharmacy [facility] [address] [place] - [address]. Call back # [phone].

## 2023-06-18 NOTE — Addendum Note (Signed)
Addended by: Damita Lack on: 06/18/2023 02:43 PM   Modules accepted: Orders

## 2023-06-25 ENCOUNTER — Other Ambulatory Visit (HOSPITAL_COMMUNITY): Payer: Self-pay

## 2023-06-28 ENCOUNTER — Other Ambulatory Visit (HOSPITAL_COMMUNITY): Payer: Self-pay

## 2023-06-28 ENCOUNTER — Telehealth: Payer: Self-pay | Admitting: Family Medicine

## 2023-06-28 ENCOUNTER — Telehealth: Payer: Self-pay

## 2023-06-28 NOTE — Telephone Encounter (Signed)
Pharmacy Patient Advocate Encounter  Received notification from [facility] that Prior Authorization for Esomeprazole Magnesium 40MG  dr capsules has been APPROVED from 06/28/23 to 06/27/24   PA #/Case ID/Reference #: [id number]

## 2023-06-28 NOTE — Telephone Encounter (Signed)
Pharmacy Patient Advocate Encounter   Received notification from Pt Calls Messages that prior authorization for Esomeprazole Magnesium 40MG  dr capsules is required/requested.   Insurance verification completed.   The patient is insured through [facility] .   Per test claim: PA required; PA submitted to [facility] via CoverMyMeds Key/confirmation #/EOC [facility] Status is pending

## 2023-06-28 NOTE — Telephone Encounter (Signed)
PA request has been Submitted. New Encounter created for follow up. For additional info see Pharmacy Prior Auth telephone encounter from 06/28/23.

## 2023-06-28 NOTE — Telephone Encounter (Signed)
Lacey Decker notified by telephone that the PA for her Esomeprazole was approved.

## 2023-06-28 NOTE — Telephone Encounter (Signed)
Pt called stated she need prior authorization To get RX esomeprazole (NEXIUM) 40 MG capsule  . Please advise [phone]

## 2023-06-28 NOTE — Telephone Encounter (Signed)
Please submit PA for Esomeprazole 40 mg.

## 2023-08-13 ENCOUNTER — Telehealth: Payer: Self-pay | Admitting: Family Medicine

## 2023-08-13 NOTE — Telephone Encounter (Signed)
Patient called in and stated that she needs a referral to be sent in to [facility].

## 2023-08-16 NOTE — Telephone Encounter (Signed)
Patient has been logged in my chart recently. Sending message to see if she is having new symptoms or if its for screening

## 2023-08-17 ENCOUNTER — Encounter: Payer: Self-pay | Admitting: Family Medicine

## 2023-08-17 DIAGNOSIS — K2100 Gastro-esophageal reflux disease with esophagitis, without bleeding: Secondary | ICD-10-CM

## 2023-08-19 ENCOUNTER — Ambulatory Visit: Payer: 59 | Admitting: Dermatology

## 2023-08-19 ENCOUNTER — Encounter: Payer: Self-pay | Admitting: Dermatology

## 2023-08-19 DIAGNOSIS — W908XXA Exposure to other nonionizing radiation, initial encounter: Secondary | ICD-10-CM

## 2023-08-19 DIAGNOSIS — L578 Other skin changes due to chronic exposure to nonionizing radiation: Secondary | ICD-10-CM

## 2023-08-19 DIAGNOSIS — L814 Other melanin hyperpigmentation: Secondary | ICD-10-CM

## 2023-08-19 DIAGNOSIS — D229 Melanocytic nevi, unspecified: Secondary | ICD-10-CM

## 2023-08-19 DIAGNOSIS — L570 Actinic keratosis: Secondary | ICD-10-CM

## 2023-08-19 DIAGNOSIS — D1801 Hemangioma of skin and subcutaneous tissue: Secondary | ICD-10-CM

## 2023-08-19 DIAGNOSIS — L821 Other seborrheic keratosis: Secondary | ICD-10-CM

## 2023-08-19 DIAGNOSIS — Z85828 Personal history of other malignant neoplasm of skin: Secondary | ICD-10-CM

## 2023-08-19 DIAGNOSIS — Z86018 Personal history of other benign neoplasm: Secondary | ICD-10-CM

## 2023-08-19 DIAGNOSIS — Z1283 Encounter for screening for malignant neoplasm of skin: Secondary | ICD-10-CM | POA: Diagnosis not present

## 2023-08-19 NOTE — Progress Notes (Signed)
Follow-Up Visit   Subjective  Lacey Decker is a 52 y.o. female who presents for the following: Skin Cancer Screening and Full Body Skin Exam  The patient presents for Total-Body Skin Exam (TBSE) for skin cancer screening and mole check. The patient has spots, moles and lesions to be evaluated, some may be new or changing and the patient may have concern these could be cancer.  Patient with hx of dysplastic nevi, BCC. She does have a spot at lip, present for about 2 months.  The following portions of the chart were reviewed this encounter and updated as appropriate: medications, allergies, medical history  Review of Systems:  No other skin or systemic complaints except as noted in HPI or Assessment and Plan.  Objective  Well appearing patient in no apparent distress; mood and affect are within normal limits.  A full examination was performed including scalp, head, eyes, ears, nose, lips, neck, chest, axillae, abdomen, back, buttocks, bilateral upper extremities, bilateral lower extremities, hands, feet, fingers, toes, fingernails, and toenails. All findings within normal limits unless otherwise noted below.   Relevant physical exam findings are noted in the Assessment and Plan.  R temple, R infraorbital cheek, R chin Erythematous thin papules/macules with gritty scale.     Assessment & Plan   SKIN CANCER SCREENING PERFORMED TODAY.  ACTINIC DAMAGE - Chronic condition, secondary to cumulative UV/sun exposure - diffuse scaly erythematous macules with underlying dyspigmentation - Recommend daily broad spectrum sunscreen SPF 30+ to sun-exposed areas, reapply every 2 hours as needed.  - Staying in the shade or wearing long sleeves, sun glasses (UVA+UVB protection) and wide brim hats (4-inch brim around the entire circumference of the hat) are also recommended for sun protection.  - Call for new or changing lesions.  LENTIGINES, SEBORRHEIC KERATOSES, HEMANGIOMAS - Benign normal skin  lesions - Benign-appearing - Call for any changes  MELANOCYTIC NEVI - Tan-brown and/or pink-flesh-colored symmetric macules and papules - Benign appearing on exam today - Observation - Call clinic for new or changing moles - Recommend daily use of broad spectrum spf 30+ sunscreen to sun-exposed areas.   HISTORY OF BASAL CELL CARCINOMA OF THE SKIN - No evidence of recurrence today at right nasal sidewall, Mohs 09/23/21 - Recommend regular full body skin exams - Recommend daily broad spectrum sunscreen SPF 30+ to sun-exposed areas, reapply every 2 hours as needed.  - Call if any new or changing lesions are noted between office visits  History of Dysplastic Nevi - No evidence of recurrence today - Recommend regular full body skin exams - Recommend daily broad spectrum sunscreen SPF 30+ to sun-exposed areas, reapply every 2 hours as needed.  - Call if any new or changing lesions are noted between office visits     AK (actinic keratosis) R temple, R infraorbital cheek, R chin  Actinic keratoses are precancerous spots that appear secondary to cumulative UV radiation exposure/sun exposure over time. They are chronic with expected duration over 1 year. A portion of actinic keratoses will progress to squamous cell carcinoma of the skin. It is not possible to reliably predict which spots will progress to skin cancer and so treatment is recommended to prevent development of skin cancer.  Recommend daily broad spectrum sunscreen SPF 30+ to sun-exposed areas, reapply every 2 hours as needed.  Recommend staying in the shade or wearing long sleeves, sun glasses (UVA+UVB protection) and wide brim hats (4-inch brim around the entire circumference of the hat). Call for new or changing  lesions.  Patient will RTC after her daughters wedding to treat.    Return in about 1 year (around 08/18/2024) for TBSE, with Dr. Katrinka Blazing, Hx BCC, Hx Dysplastic Nevi, next available to treat AK's.  Anise Salvo, RMA, am acting as scribe for Elie Goody, MD .   Documentation: I have reviewed the above documentation for accuracy and completeness, and I agree with the above.  Elie Goody, MD

## 2023-08-19 NOTE — Patient Instructions (Signed)

## 2023-09-13 ENCOUNTER — Encounter: Payer: Self-pay | Admitting: Dermatology

## 2023-09-13 ENCOUNTER — Ambulatory Visit (INDEPENDENT_AMBULATORY_CARE_PROVIDER_SITE_OTHER): Payer: 59 | Admitting: Dermatology

## 2023-09-13 DIAGNOSIS — D23112 Other benign neoplasm of skin of right lower eyelid, including canthus: Secondary | ICD-10-CM

## 2023-09-13 DIAGNOSIS — D239 Other benign neoplasm of skin, unspecified: Secondary | ICD-10-CM

## 2023-09-13 DIAGNOSIS — D23122 Other benign neoplasm of skin of left lower eyelid, including canthus: Secondary | ICD-10-CM

## 2023-09-13 DIAGNOSIS — L570 Actinic keratosis: Secondary | ICD-10-CM

## 2023-09-13 DIAGNOSIS — L578 Other skin changes due to chronic exposure to nonionizing radiation: Secondary | ICD-10-CM

## 2023-09-13 DIAGNOSIS — W908XXA Exposure to other nonionizing radiation, initial encounter: Secondary | ICD-10-CM

## 2023-09-13 DIAGNOSIS — L853 Xerosis cutis: Secondary | ICD-10-CM

## 2023-09-13 NOTE — Patient Instructions (Addendum)
Cryotherapy Aftercare  Wash gently with soap and water everyday.   Apply Vaseline Jelly daily until healed.     Actinic keratoses are precancerous spots that appear secondary to cumulative UV radiation exposure/sun exposure over time. They are chronic with expected duration over 1 year. A portion of actinic keratoses will progress to squamous cell carcinoma of the skin. It is not possible to reliably predict which spots will progress to skin cancer and so treatment is recommended to prevent development of skin cancer.  Recommend daily broad spectrum sunscreen SPF 30+ to sun-exposed areas, reapply every 2 hours as needed.  Recommend staying in the shade or wearing long sleeves, sun glasses (UVA+UVB protection) and wide brim hats (4-inch brim around the entire circumference of the hat). Call for new or changing lesions.    Gentle Skin Care Guide  1. Bathe no more than once a day.  2. Avoid bathing in hot water  3. Use a mild soap like Dove, Vanicream, Cetaphil, CeraVe. Can use Lever 2000 or Cetaphil antibacterial soap  4. Use soap only where you need it. On most days, use it under your arms, between your legs, and on your feet. Let the water rinse other areas unless visibly dirty.  5. When you get out of the bath/shower, use a towel to gently blot your skin dry, don't rub it.  6. While your skin is still a little damp, apply a moisturizing cream such as Vanicream, CeraVe, Cetaphil, Eucerin, Sarna lotion or plain Vaseline Jelly. For hands apply Neutrogena [place] Hand Cream or Excipial Hand Cream.  7. Reapply moisturizer any time you start to itch or feel dry.  8. Sometimes using free and clear laundry detergents can be helpful. Fabric softener sheets should be avoided. Downy Free & Gentle liquid, or any liquid fabric softener that is free of dyes and perfumes, it acceptable to use  9. If your doctor has given you prescription creams you may apply moisturizers over them     Recommend  Normand Sloop, M.D. [address] [place], [place] [address] [phone]  [email or web address]    Due to recent changes in healthcare laws, you may see results of your pathology and/or laboratory studies on MyChart before the doctors have had a chance to review them. We understand that in some cases there may be results that are confusing or concerning to you. Please understand that not all results are received at the same time and often the doctors may need to interpret multiple results in order to provide you with the best plan of care or course of treatment. Therefore, we ask that you please give [place] 2 business days to thoroughly review all your results before contacting the office for clarification. Should we see a critical lab result, you will be contacted sooner.   If You Need Anything After Your Visit  If you have any questions or concerns for your doctor, please call our main line at [phone] and press option 4 to reach your doctor's medical assistant. If no one answers, please leave a voicemail as directed and we will return your call as soon as possible. Messages left after 4 pm will be answered the following business day.   You may also send [place] a message via MyChart. We typically respond to MyChart messages within 1-2 business days.  For prescription refills, please ask your pharmacy to contact our office. Our fax number is [phone].  If you have an urgent issue when the clinic is closed that  cannot wait until the next business day, you can page your doctor at the number below.    Please note that while we do our best to be available for urgent issues outside of office hours, we are not available 24/7.   If you have an urgent issue and are unable to reach [place], you may choose to seek medical care at your doctor's office, retail clinic, urgent care center, or emergency room.  If you have a medical emergency, please immediately call 911 or  go to the emergency department.  Pager Numbers  - Dr. Gwen Pounds: [phone]  - Dr. Roseanne Reno: [phone]  - Dr. Katrinka Blazing: [phone]   In the event of inclement weather, please call our main line at [phone] for an update on the status of any delays or closures.  Dermatology Medication Tips: Please keep the boxes that topical medications come in in order to help keep track of the instructions about where and how to use these. Pharmacies typically print the medication instructions only on the boxes and not directly on the medication tubes.   If your medication is too expensive, please contact our office at [phone] option 4 or send [place] a message through MyChart.   We are unable to tell what your co-pay for medications will be in advance as this is different depending on your insurance coverage. However, we may be able to find a substitute medication at lower cost or fill out paperwork to get insurance to cover a needed medication.   If a prior authorization is required to get your medication covered by your insurance company, please allow [place] 1-2 business days to complete this process.  Drug prices often vary depending on where the prescription is filled and some pharmacies may offer cheaper prices.  The website www.goodrx.com contains coupons for medications through different pharmacies. The prices here do not account for what the cost may be with help from insurance (it may be cheaper with your insurance), but the website can give you the price if you did not use any insurance.  - You can print the associated coupon and take it with your prescription to the pharmacy.  - You may also stop by our office during regular business hours and pick up a GoodRx coupon card.  - If you need your prescription sent electronically to a different pharmacy, notify our office through [facility] or by phone at [phone] option 4.     Si Usted Necesita Algo Despus de Su  Visita  Tambin puede enviarnos un mensaje a travs de Clinical cytogeneticist. Por lo general respondemos a los mensajes de MyChart en el transcurso de 1 a 2 das hbiles.  Para renovar recetas, por favor pida a su farmacia que se ponga en contacto con nuestra oficina. Annie Sable de fax es [place] [phone].  Si tiene un asunto urgente cuando la clnica est cerrada y que no puede esperar hasta el siguiente da hbil, puede llamar/localizar a su doctor(a) al nmero que aparece a continuacin.   Por favor, tenga en cuenta que aunque hacemos todo lo posible para estar disponibles para asuntos urgentes fuera del horario de [place], no estamos disponibles las 24 horas del da, los 7 [address]  [address] de la [place].   Si tiene un problema urgente y no puede comunicarse con nosotros, puede optar por buscar atencin mdica  en el consultorio de su doctor(a), en una clnica privada, en un centro de atencin urgente o en una sala de emergencias.  Si tiene [employer], por  favor llame inmediatamente al 911 o vaya a la sala de emergencias.  Nmeros de bper  - Dr. Gwen Pounds: [phone]  - Dra. Roseanne Reno: [phone]  - Dr. Katrinka Blazing: [phone]   En caso de inclemencias del tiempo, por favor llame a Lacy Duverney principal al [phone] para una actualizacin sobre el [place] de cualquier retraso o cierre.  Consejos para la medicacin en dermatologa: Por favor, guarde las cajas en las que vienen los medicamentos de uso tpico para ayudarle a seguir las instrucciones sobre dnde y cmo usarlos. Las farmacias generalmente imprimen las instrucciones del medicamento slo en las cajas y no directamente en los tubos del [place].   Si su medicamento es muy caro, por favor, pngase en contacto con Rolm Gala llamando al [phone] y presione la opcin 4 o envenos un mensaje a travs de Clinical cytogeneticist.   No podemos decirle cul ser su copago por los medicamentos por adelantado ya que esto es diferente dependiendo de  la cobertura de su seguro. Sin embargo, es posible que podamos encontrar un medicamento sustituto a Audiological scientist un formulario para que el seguro cubra el medicamento que se considera necesario.   Si se requiere una autorizacin previa para que su compaa de seguros [place] su medicamento, por favor permtanos de 1 a 2 das hbiles para completar [address]  Los precios de los medicamentos varan con frecuencia dependiendo del Environmental consultant de dnde se surte la receta y alguna farmacias pueden ofrecer precios ms baratos.  El sitio web www.goodrx.com tiene cupones para medicamentos de Health and safety inspector. Los precios aqu no tienen en cuenta lo que podra costar con la ayuda del seguro (puede ser ms barato con su seguro), pero el sitio web puede darle el precio si no utiliz Tourist information centre manager.  - Puede imprimir el cupn correspondiente y llevarlo con su receta a la farmacia.  - Tambin puede pasar por nuestra oficina durante el horario de atencin regular y Education officer, museum una tarjeta de cupones de GoodRx.  - Si necesita que su receta se enve electrnicamente a una farmacia diferente, informe a nuestra oficina a travs de MyChart de [place] o por telfono llamando al [phone] y presione la opcin 4.

## 2023-09-13 NOTE — Progress Notes (Signed)
   Follow-Up Visit   Subjective  Lacey Decker is a 52 y.o. female who presents for the following: Actinic keratosis.  Here for LN2 treatment. Deferred at last visit due to daughter's wedding. Right temple, R infraorbital cheek, R chin.   The following portions of the chart were reviewed this encounter and updated as appropriate: medications, allergies, medical history  Review of Systems:  No other skin or systemic complaints except as noted in HPI or Assessment and Plan.  Objective  Well appearing patient in no apparent distress; mood and affect are within normal limits.  A focused examination was performed of the following areas: Face, back   Relevant exam findings are noted in the Assessment and Plan.  Right Temple x1, Right infraorbital cheekx1, R chin x1 (3) Erythematous thin papules/macules with gritty scale.     Assessment & Plan   AK (actinic keratosis) (3) Right Temple x1, Right infraorbital cheekx1, R chin x1  Actinic keratoses are precancerous spots that appear secondary to cumulative UV radiation exposure/sun exposure over time. They are chronic with expected duration over 1 year. A portion of actinic keratoses will progress to squamous cell carcinoma of the skin. It is not possible to reliably predict which spots will progress to skin cancer and so treatment is recommended to prevent development of skin cancer.  Recommend daily broad spectrum sunscreen SPF 30+ to sun-exposed areas, reapply every 2 hours as needed.  Recommend staying in the shade or wearing long sleeves, sun glasses (UVA+UVB protection) and wide brim hats (4-inch brim around the entire circumference of the hat). Call for new or changing lesions.  Destruction of lesion - Right Temple x1, Right infraorbital cheekx1, R chin x1 (3) Complexity: simple   Destruction method: cryotherapy   Informed consent: discussed and consent obtained   Timeout:  patient name, date of birth, surgical site, and procedure  verified Lesion destroyed using liquid nitrogen: Yes   Region frozen until ice ball extended beyond lesion: Yes   Cryo cycles: 1 or 2. Outcome: patient tolerated procedure well with no complications   Post-procedure details: wound care instructions given    Xerosis cutis  Actinic elastosis  Syringoma   Xerosis - diffuse xerotic patch on left lower back - recommend gentle, hydrating moisturizer daily - gentle skin care handout given  Syringoma of eyelid B/L lower eyelids Benign-appearing.  Observation.  Call clinic for new or changing lesions.  Recommend daily use of broad spectrum spf 30+ sunscreen to sun-exposed areas.  Advised removal would be with a cosmetic dermatologist. Dr Ronna Polio   ACTINIC DAMAGE - chronic, secondary to cumulative UV radiation exposure/sun exposure over time - diffuse scaly erythematous macules with underlying dyspigmentation - Recommend daily broad spectrum sunscreen SPF 30+ to sun-exposed areas, reapply every 2 hours as needed.  - Recommend staying in the shade or wearing long sleeves, sun glasses (UVA+UVB protection) and wide brim hats (4-inch brim around the entire circumference of the hat). - Call for new or changing lesions.    Return for TBSE As Scheduled, With Dr. Katrinka Blazing.  I, Lawson Radar, CMA, am acting as scribe for Elie Goody, MD.   Documentation: I have reviewed the above documentation for accuracy and completeness, and I agree with the above.  Elie Goody, MD

## 2023-09-20 ENCOUNTER — Encounter: Payer: Self-pay | Admitting: Gastroenterology

## 2023-09-20 ENCOUNTER — Ambulatory Visit (INDEPENDENT_AMBULATORY_CARE_PROVIDER_SITE_OTHER): Payer: 59 | Admitting: Gastroenterology

## 2023-09-20 VITALS — BP 135/84 | HR 69 | Temp 98.4°F | Ht 65.5 in | Wt 218.5 lb

## 2023-09-20 DIAGNOSIS — K219 Gastro-esophageal reflux disease without esophagitis: Secondary | ICD-10-CM

## 2023-09-20 MED ORDER — ESOMEPRAZOLE MAGNESIUM 40 MG PO CPDR: 40.0000 mg | DELAYED_RELEASE_CAPSULE | Freq: Two times a day (BID) | ORAL | 1 refills | Status: AC

## 2023-09-20 NOTE — Progress Notes (Signed)
Arlyss Repress, MD [address]  [address]  [place], [place] [address]  Main: [phone]  Fax: [phone]    Gastroenterology Consultation  Referring Provider:     Excell Seltzer, MD Primary Care Physician:  Excell Seltzer, MD Primary Gastroenterologist:  Dr. Arlyss Repress Reason for Consultation: Chronic GERD, flareup        HPI:   Lacey Decker is a 52 y.o. female referred by Dr. Excell Seltzer, MD  for consultation & management of worsening of GERD symptoms.  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  This has resulted in flareup of her acid reflux symptoms, reports burning in the throat, regurgitation of acid at night.  She was taking Prilosec, switched to esomeprazole 40 mg daily which is helping her symptoms modestly.  She has history of bipolar, anxiety and working through different claims for esophageal cancer made her really worried if her upper GI symptoms are related to esophageal cancer.  She saw me for similar complaints in 2021, underwent upper endoscopy which was unremarkable.  Patient has also gained weight over last several months due to work-related stress and stress eating behavior.  She reports occasional difficulty swallowing  NSAIDs: None  Antiplts/Anticoagulants/Anti thrombotics: None  GI Procedures:  EGD and colonoscopy 01/10/2020 - Duodenal deformity. Biopsied. - Normal duodenal bulb and second portion of the duodenum. - Normal stomach. Biopsied. - Esophagogastric landmarks identified. - Normal gastroesophageal junction and esophagus. Biopsied.  - One diminutive polyp in the cecum, removed with a cold snare. Resected and retrieved. - The distal rectum and anal verge are normal on retroflexion view. - The examination was otherwise normal.  DIAGNOSIS:  A. AMPULLARY LESION; COLD BIOPSY:  - ACUTELY INFLAMED DUODENAL MUCOSA WITH  REGENERATIVE CHANGES AND  FOVEOLAR HYPERPLASIA, CONSISTENT WITH REACTIVE DUODENITIS.  - SEE COMMENT.  - DEEPER SECTIONS EXAMINED.  - NEGATIVE FOR DYSPLASIA AND MALIGNANCY.   B.  STOMACH, RANDOM; COLD BIOPSY:  - OXYNTIC MUCOSA WITH MILD SUPERFICIAL EDEMA, NON-SPECIFIC.  - NEGATIVE FOR H. PYLORI, DYSPLASIA, AND MALIGNANCY.   C.  ESOPHAGUS; COLD BIOPSY:  - UNREMARKABLE FRAGMENTS OF SQUAMOUS MUCOSA.  - NEGATIVE FOR EOSINOPHILS, DYSPLASIA, AND MALIGNANCY.   D.  COLON POLYP, CECUM; COLD BIOPSY:  - TUBULAR ADENOMA.  - NEGATIVE FOR HIGH-GRADE DYSPLASIA AND MALIGNANCY.   Past Medical History:  Diagnosis Date   Adenomyosis    Anxiety    Arthritis    Basal cell carcinoma 08/05/2021   R nasal sidewall, MOHs completed 09/23/21   Bipolar 1 disorder (HCC)    Chlamydia    as a teen   Dysplastic nevus 01/13/2010   left lat sacral, sacral, 0.4 x 0.3cm - DYSPLASTIC DYSPLASTIC JUNCTIONAL JUNCTIONAL NEVUS WITH MILD MELANOCYTIC MELANOCYTIC ATYPIA   Dysplastic nevus 01/13/2010   left ant axillary fold 0.3 cm - dysplastic compound nevus with mild melanocytic atypia   Endometriosis    Family history of adverse reaction to anesthesia    mother has mood changes after anesthesia   GERD (gastroesophageal reflux disease)    MRSA (methicillin resistant staph aureus) culture positive    PMDD (premenstrual dysphoric disorder)    Pneumonia    Pre-diabetes    SUI (stress urinary incontinence, female)     Past Surgical History:  Procedure Laterality Date   ABDOMINAL HYSTERECTOMY  2009   partial lap hyst, LSO due  to adenomyosis/endometriosis   btl  11/09/1998   COLONOSCOPY WITH PROPOFOL N/A 01/10/2020   Procedure: COLONOSCOPY WITH PROPOFOL;  Surgeon: Toney Reil, MD;  Location: [facility] ENDOSCOPY;  Service: Gastroenterology;  Laterality: N/A;   DILATION AND CURETTAGE OF UTERUS  1997   after miscarriage   ESOPHAGOGASTRODUODENOSCOPY (EGD) WITH PROPOFOL N/A 01/10/2020   Procedure:  ESOPHAGOGASTRODUODENOSCOPY (EGD) WITH PROPOFOL;  Surgeon: Toney Reil, MD;  Location: [facility] ENDOSCOPY;  Service: Gastroenterology;  Laterality: N/A;   LEEP  2000   misscarriage     x5   MOHS SURGERY     Nose   NEUROMA SURGERY     lipoma removal Right labia   nsvd     3   TOTAL HIP ARTHROPLASTY Right 11/30/2022   Procedure: TOTAL HIP ARTHROPLASTY;  Surgeon: Joen Laura, MD;  Location: [facility] ORS;  Service: Orthopedics;  Laterality: Right;     Current Outpatient Medications:    calcium carbonate (TUMS - DOSED IN MG ELEMENTAL CALCIUM) 500 MG chewable tablet, Chew 3-4 tablets (1,500-2,000 mg total) by mouth daily as needed for indigestion or heartburn., Disp: , Rfl:    fluticasone (FLONASE) 50 MCG/ACT nasal spray, Place 2 sprays into both nostrils daily as needed for allergies or rhinitis., Disp: 48 mL, Rfl: 0   LORazepam (ATIVAN) 1 MG tablet, Take 1 tablet (1 mg total) by mouth 2 (two) times daily. Prn vertigo or anxiety, Disp: 30 tablet, Rfl: 0   esomeprazole (NEXIUM) 40 MG capsule, Take 1 capsule (40 mg total) by mouth 2 (two) times daily before a meal., Disp: 180 capsule, Rfl: 1   Family History  Problem Relation Age of Onset   Hypertension Mother    Hyperlipidemia Mother    Depression Mother    Basal cell carcinoma Mother    Colon polyps Mother    Bipolar disorder Sister    Depression Maternal Grandmother    Lung cancer Maternal Grandfather    Breast cancer Neg Hx      Social History   Tobacco Use   Smoking status: Never   Smokeless tobacco: Never  Vaping Use   Vaping status: Never Used  Substance Use Topics   Alcohol use: No    Alcohol/week: 0.0 standard drinks of alcohol   Drug use: No    Allergies as of 09/20/2023 - Review Complete 09/20/2023  Allergen Reaction Noted   Codeine Anaphylaxis    Morphine Swelling    Vancomycin Other (See Comments) 11/30/2022   Clarithromycin  07/05/2010   Erythromycin base Other (See Comments) 07/22/2012   Augmentin  [amoxicillin-pot clavulanate] Rash 11/06/2015   Sulfamethoxazole-trimethoprim Rash     Review of Systems:    All systems reviewed and negative except where noted in HPI.   Physical Exam:  BP 135/84 (BP Location: Left Arm, Patient Position: Sitting, Cuff Size: Normal)   Pulse 69   Temp 98.4 F (36.9 C) (Oral)   Ht 5' 5.5" (1.664 m)   Wt 218 lb 8 oz (99.1 kg)   BMI 35.81 kg/m  No LMP recorded. Patient has had a hysterectomy.  General:   Alert,  Well-developed, well-nourished, pleasant and cooperative in NAD Head:  Normocephalic and atraumatic. Eyes:  Sclera clear, no icterus.   Conjunctiva pink. Ears:  Normal auditory acuity. Nose:  No deformity, discharge, or lesions. Mouth:  No deformity or lesions,oropharynx pink & moist. Neck:  Supple; no masses or thyromegaly. Lungs:  Respirations even and unlabored.  Clear throughout to auscultation.   No wheezes, crackles, or  rhonchi. No acute distress. Heart:  Regular rate and rhythm; no murmurs, clicks, rubs, or gallops. Abdomen:  Normal bowel sounds. Soft, non-tender and non-distended without masses, hepatosplenomegaly or hernias noted.  No guarding or rebound tenderness.   Rectal: Not performed Msk:  Symmetrical without gross deformities. Good, equal movement & strength bilaterally. Pulses:  Normal pulses noted. Extremities:  No clubbing or edema.  No cyanosis. Neurologic:  Alert and oriented x3;  grossly normal neurologically. Skin:  Intact without significant lesions or rashes. No jaundice. Psych:  Alert and cooperative. Normal mood and affect.  Imaging Studies: No abdominal imaging  Assessment and Plan:   ELESHIA ZIERDEN is a 52 y.o. Caucasian female with history of anxiety, bipolar, chronic GERD is seen in consultation for flareup of GERD symptoms, partially controlled on esomeprazole 40 mg daily. Advised patient to increase to 40 mg twice daily before meals, discussed about antireflux lifestyle, avoid food triggers such as  coffee, chocolate, alcohol. If her symptoms are persistent after 1 month trial, she should call our office and we will schedule upper endoscopy for further evaluation Patient felt reassured with my recommendations   Follow up as needed, contact via MyChart in 1 month   Arlyss Repress, MD

## 2023-09-23 ENCOUNTER — Telehealth: Payer: Self-pay

## 2023-09-23 NOTE — Telephone Encounter (Signed)
Submitted a PA on the Nexium 40mg  through cover my meds. Waiting on response form insurance company

## 2023-09-28 ENCOUNTER — Encounter (HOSPITAL_COMMUNITY): Payer: Self-pay

## 2023-09-28 ENCOUNTER — Ambulatory Visit (HOSPITAL_COMMUNITY)
Admission: EM | Admit: 2023-09-28 | Discharge: 2023-09-28 | Disposition: A | Discharge status: Home / Self Care | Payer: 59 | Attending: Emergency Medicine | Admitting: Emergency Medicine

## 2023-09-28 DIAGNOSIS — M542 Cervicalgia: Secondary | ICD-10-CM

## 2023-09-28 DIAGNOSIS — S060X0A Concussion without loss of consciousness, initial encounter: Secondary | ICD-10-CM | POA: Diagnosis not present

## 2023-09-28 DIAGNOSIS — H5789 Other specified disorders of eye and adnexa: Secondary | ICD-10-CM | POA: Diagnosis not present

## 2023-09-28 DIAGNOSIS — R519 Headache, unspecified: Secondary | ICD-10-CM | POA: Diagnosis not present

## 2023-09-28 MED ORDER — IBUPROFEN 600 MG PO TABS: 600.0000 mg | ORAL_TABLET | Freq: Three times a day (TID) | ORAL | 0 refills | Status: DC | PRN

## 2023-09-28 MED ORDER — CYCLOBENZAPRINE HCL 5 MG PO TABS: 5.0000 mg | ORAL_TABLET | Freq: Three times a day (TID) | ORAL | 0 refills | Status: DC | PRN
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
## 2023-09-28 NOTE — Discharge Instructions (Signed)
I recommend you use Systane eye drops as needed to help soothe your irritated eyes. You can put it in the fridge to keep it cool - this sometimes feels better in your eyes.   For your irritated skin, I recommend applying something like Aquaphor or vaseline to your skin. I would not use a face lotion until your skin feels better.

## 2023-10-21 ENCOUNTER — Ambulatory Visit: Payer: 59 | Admitting: Gastroenterology

## 2023-11-05 ENCOUNTER — Ambulatory Visit (INDEPENDENT_AMBULATORY_CARE_PROVIDER_SITE_OTHER): Payer: 59 | Admitting: Nurse Practitioner

## 2023-11-05 VITALS — BP 130/70 | HR 87 | Temp 98.2°F | Ht 65.0 in | Wt 218.6 lb

## 2023-11-05 DIAGNOSIS — J029 Acute pharyngitis, unspecified: Secondary | ICD-10-CM | POA: Diagnosis not present

## 2023-11-05 DIAGNOSIS — J0101 Acute recurrent maxillary sinusitis: Secondary | ICD-10-CM | POA: Diagnosis not present

## 2023-11-05 LAB — POCT RAPID STREP A (OFFICE): Rapid Strep A Screen: NEGATIVE

## 2023-11-05 LAB — POC COVID19 BINAXNOW: SARS Coronavirus 2 Ag: NEGATIVE

## 2023-11-05 MED ORDER — DOXYCYCLINE HYCLATE 100 MG PO TABS: 100.0000 mg | ORAL_TABLET | Freq: Two times a day (BID) | ORAL | 0 refills | Status: AC

## 2023-11-05 NOTE — Assessment & Plan Note (Signed)
COVID and strep test in office.  Patient will use over-the-counter analgesics as needed.  Patient can also use warm salt water gargles and throat lozenges as needed.  Drink plenty of fluid

## 2023-11-05 NOTE — Assessment & Plan Note (Signed)
COVID and strep test negative in office.  Patient seems to be doing well 3 to 4 days but she was insistent that she has sinus infection every year around this time with same symptoms for the past 20 years that require antibiotic use.  Patient was prescribed doxycycline 100 mg twice daily for 7 days.  Did offer to prescribe steroids for pressure in sinuses.  Patient does not tolerate steroids well it causes insomnia per her report.  Patient continue using the Flonase nasal spray and over-the-counter antihistamine.  Follow-up if no improvement

## 2023-11-05 NOTE — Patient Instructions (Signed)
Nice to see you today Continue the allergy pill and nasal sprays Drink plenty of fluid Use warm salt water gargles, throat lozenges, or throat numbing spray You can use over the counter analgesics as needed

## 2023-11-05 NOTE — Progress Notes (Signed)
Acute Office Visit  Subjective:     Patient ID: Lacey Decker, female    DOB: Aug 07, 1971, 52 y.o.   MRN: [id number]  Chief Complaint  Patient presents with   Sore Throat    Pt complains of 3-4 days she states that her throat is sore and her face hurts. States her throat feels numb and the roof of mouth is raw.     Patient is in today for sore throat  with a history of GERD, cancer, anxiety, depression.  Symptoms started on 3-4 days ago Coivd vaccine: not up to date Flu vaccine: not up to date Granddaugther: has been sick for the past 3 weeks.   States that she has been using ice, zyrtec, saline nasal spray, allergy nasal spray, tylenol. States that she does not feel like it helps    Review of Systems  Constitutional:  Positive for malaise/fatigue. Negative for chills and fever.  HENT:  Positive for congestion, ear pain (full), sinus pain and sore throat.   Respiratory:  Negative for cough.   Cardiovascular:  Negative for chest pain.  Gastrointestinal:  Negative for abdominal pain, constipation, diarrhea, nausea and vomiting.  Musculoskeletal:  Negative for joint pain and myalgias.  Neurological:  Negative for headaches.        Objective:    BP 130/70   Pulse 87   Temp 98.2 F (36.8 C) (Oral)   Ht 5\' 5"  (1.651 m)   Wt 218 lb 9.6 oz (99.2 kg)   SpO2 98%   BMI 36.38 kg/m    Physical Exam Vitals and nursing note reviewed.  Constitutional:      Appearance: Normal appearance.  HENT:     Right Ear: Tympanic membrane, ear canal and external ear normal.     Left Ear: Tympanic membrane, ear canal and external ear normal.     Nose:     Right Sinus: Maxillary sinus tenderness present. No frontal sinus tenderness.     Left Sinus: Maxillary sinus tenderness present. No frontal sinus tenderness.     Mouth/Throat:     Mouth: Mucous membranes are moist.     Pharynx: Oropharynx is clear.     Comments: PND present  Cardiovascular:     Rate and Rhythm: Normal rate and  regular rhythm.     Heart sounds: Normal heart sounds.  Pulmonary:     Effort: Pulmonary effort is normal.     Breath sounds: Normal breath sounds.  Lymphadenopathy:     Cervical: No cervical adenopathy.  Neurological:     Mental Status: She is alert.     Results for orders placed or performed in visit on 11/05/23  POCT rapid strep A  Result Value Ref Range   Rapid Strep A Screen Negative Negative  POC COVID-19 BinaxNow  Result Value Ref Range   SARS Coronavirus 2 Ag Negative Negative        Assessment & Plan:   Problem List Items Addressed This Visit       Respiratory   Acute recurrent maxillary sinusitis   COVID and strep test negative in office.  Patient seems to be doing well 3 to 4 days but she was insistent that she has sinus infection every year around this time with same symptoms for the past 20 years that require antibiotic use.  Patient was prescribed doxycycline 100 mg twice daily for 7 days.  Did offer to prescribe steroids for pressure in sinuses.  Patient does not tolerate steroids well it causes  insomnia per her report.  Patient continue using the Flonase nasal spray and over-the-counter antihistamine.  Follow-up if no improvement      Relevant Medications   doxycycline (VIBRA-TABS) 100 MG tablet     Other   Sore throat - Primary   COVID and strep test in office.  Patient will use over-the-counter analgesics as needed.  Patient can also use warm salt water gargles and throat lozenges as needed.  Drink plenty of fluid      Relevant Orders   POCT rapid strep A (Completed)   POC COVID-19 BinaxNow (Completed)    Meds ordered this encounter  Medications   doxycycline (VIBRA-TABS) 100 MG tablet    Sig: Take 1 tablet (100 mg total) by mouth 2 (two) times daily for 7 days.    Dispense:  14 tablet    Refill:  0    Supervising Provider:   Roxy Manns A [[id number]]    Return if symptoms worsen or fail to improve.  Audria Nine, NP

## 2023-12-15 ENCOUNTER — Other Ambulatory Visit: Payer: Self-pay | Admitting: Medical Genetics

## 2024-04-07 ENCOUNTER — Ambulatory Visit: Admitting: Family Medicine

## 2024-04-07 ENCOUNTER — Encounter: Payer: Self-pay | Admitting: Family Medicine

## 2024-04-07 VITALS — BP 140/80 | HR 71 | Temp 98.0°F | Ht 65.0 in | Wt 218.2 lb

## 2024-04-07 DIAGNOSIS — E66812 Obesity, class 2: Secondary | ICD-10-CM | POA: Diagnosis not present

## 2024-04-07 DIAGNOSIS — R350 Frequency of micturition: Secondary | ICD-10-CM

## 2024-04-07 DIAGNOSIS — K2100 Gastro-esophageal reflux disease with esophagitis, without bleeding: Secondary | ICD-10-CM | POA: Diagnosis not present

## 2024-04-07 DIAGNOSIS — F411 Generalized anxiety disorder: Secondary | ICD-10-CM

## 2024-04-07 DIAGNOSIS — E6609 Other obesity due to excess calories: Secondary | ICD-10-CM

## 2024-04-07 DIAGNOSIS — Z6836 Body mass index (BMI) 36.0-36.9, adult: Secondary | ICD-10-CM

## 2024-04-07 LAB — POC URINALSYSI DIPSTICK (AUTOMATED)
Bilirubin, UA: NEGATIVE
Glucose, UA: NEGATIVE
Ketones, UA: NEGATIVE
Nitrite, UA: NEGATIVE
Protein, UA: POSITIVE — AB
Spec Grav, UA: >=1.03 — AB (ref 1.010–1.025)
Urobilinogen, UA: 0.2 U/dL
pH, UA: 5 (ref 5.0–8.0)

## 2024-04-07 MED ORDER — FLUTICASONE PROPIONATE 50 MCG/ACT NA SUSP: 2.0000 | Freq: Every day | NASAL | 1 refills | Status: AC | PRN

## 2024-04-07 MED ORDER — PANTOPRAZOLE SODIUM 40 MG PO TBEC: 40.0000 mg | DELAYED_RELEASE_TABLET | Freq: Two times a day (BID) | ORAL | 3 refills | Status: AC

## 2024-04-07 MED ORDER — LORAZEPAM 1 MG PO TABS: 1.0000 mg | ORAL_TABLET | Freq: Two times a day (BID) | ORAL | 0 refills | Status: AC

## 2024-04-07 NOTE — Progress Notes (Signed)
 Patient ID: Lacey Decker, female    DOB: 10/05/1971, 53 y.o.   MRN: [id number]  This visit was conducted in person.  BP (!) 140/80   Pulse 71   Temp 98 F (36.7 C) (Temporal)   Ht 5' 5 (1.651 m)   Wt 218 lb 4 oz (99 kg)   SpO2 98%   BMI 36.32 kg/m    CC:  Chief Complaint  Patient presents with   Urinary Frequency    States she has wet the bed twice   Anxiety   Neck Pain   Numbness    Roof of mouth/tongue   Gastroesophageal Reflux    Subjective:   HPI: Lacey Decker is a 53 y.o. female presenting on 04/07/2024 for Urinary Frequency (States she has wet the bed twice), Anxiety, Neck Pain, Numbness (Roof of mouth/tongue), and Gastroesophageal Reflux    New onset urinary frequency at night ( wakes 2 -5 times ) in last month.  Has chronic urinary urgency... 10 years ago told by urology bladder dropped.  Wet bed 2 times in last month.  Has history of  OAB... stress and urgency.  No daytime incontinence.  She has been more anxious lately given work issues.  Has chronic neck pain.. holding stress in neck. DDD, OA in neck  Has noted numbness on roof of mouth, tongue  Issues with GERD using  nexium , omeprazole ... does not feel like it helps as well.  Has history of  esophagitis, PUD.      Relevant past medical, surgical, family and social history reviewed and updated as indicated. Interim medical history since our last visit reviewed. Allergies and medications reviewed and updated. Outpatient Medications Prior to Visit  Medication Sig Dispense Refill   calcium  carbonate (TUMS - DOSED IN MG ELEMENTAL CALCIUM ) 500 MG chewable tablet Chew 3-4 tablets (1,500-2,000 mg total) by mouth daily as needed for indigestion or heartburn.     esomeprazole  (NEXIUM ) 40 MG capsule Take 1 capsule (40 mg total) by mouth 2 (two) times daily before a meal. 180 capsule 1   fluticasone  (FLONASE ) 50 MCG/ACT nasal spray Place 2 sprays into both nostrils daily as needed for allergies or  rhinitis. 48 mL 0   cyclobenzaprine  (FLEXERIL ) 5 MG tablet Take 1 tablet (5 mg total) by mouth 3 (three) times daily as needed for muscle spasms. (Patient not taking: Reported on 11/05/2023) 21 tablet 0   ibuprofen  (ADVIL ) 600 MG tablet Take 1 tablet (600 mg total) by mouth every 8 (eight) hours as needed. (Patient not taking: Reported on 11/05/2023) 30 tablet 0   LORazepam  (ATIVAN ) 1 MG tablet Take 1 tablet (1 mg total) by mouth 2 (two) times daily. Prn vertigo or anxiety 30 tablet 0   No facility-administered medications prior to visit.     Per HPI unless specifically indicated in ROS section below Review of Systems  Constitutional:  Negative for fatigue and fever.  HENT:  Negative for congestion.   Eyes:  Negative for pain.  Respiratory:  Negative for cough and shortness of breath.   Cardiovascular:  Negative for chest pain, palpitations and leg swelling.  Gastrointestinal:  Negative for abdominal pain.  Endocrine: Positive for polyuria.  Genitourinary:  Negative for dysuria and vaginal bleeding.  Musculoskeletal:  Negative for back pain.  Neurological:  Negative for syncope, light-headedness and headaches.  Psychiatric/Behavioral:  Negative for dysphoric mood.    Objective:  BP (!) 140/80   Pulse 71   Temp 98 F (36.7 C) (  Temporal)   Ht 5' 5 (1.651 m)   Wt 218 lb 4 oz (99 kg)   SpO2 98%   BMI 36.32 kg/m   Wt Readings from Last 3 Encounters:  04/07/24 218 lb 4 oz (99 kg)  11/05/23 218 lb 9.6 oz (99.2 kg)  09/28/23 215 lb (97.5 kg)      Physical Exam Constitutional:      General: She is not in acute distress.    Appearance: Normal appearance. She is well-developed. She is not ill-appearing or toxic-appearing.  HENT:     Head: Normocephalic.     Right Ear: Hearing, tympanic membrane, ear canal and external ear normal. Tympanic membrane is not erythematous, retracted or bulging.     Left Ear: Hearing, tympanic membrane, ear canal and external ear normal. Tympanic  membrane is not erythematous, retracted or bulging.     Nose: No mucosal edema or rhinorrhea.     Right Sinus: No maxillary sinus tenderness or frontal sinus tenderness.     Left Sinus: No maxillary sinus tenderness or frontal sinus tenderness.     Mouth/Throat:     Pharynx: Uvula midline.   Eyes:     General: Lids are normal. Lids are everted, no foreign bodies appreciated.     Conjunctiva/sclera: Conjunctivae normal.     Pupils: Pupils are equal, round, and reactive to light.   Neck:     Thyroid : No thyroid  mass or thyromegaly.     Vascular: No carotid bruit.     Trachea: Trachea normal.   Cardiovascular:     Rate and Rhythm: Normal rate and regular rhythm.     Pulses: Normal pulses.     Heart sounds: Normal heart sounds, S1 normal and S2 normal. No murmur heard.    No friction rub. No gallop.  Pulmonary:     Effort: Pulmonary effort is normal. No tachypnea or respiratory distress.     Breath sounds: Normal breath sounds. No decreased breath sounds, wheezing, rhonchi or rales.  Abdominal:     General: Bowel sounds are normal.     Palpations: Abdomen is soft.     Tenderness: There is no abdominal tenderness.   Musculoskeletal:     Cervical back: Normal range of motion and neck supple.   Skin:    General: Skin is warm and dry.     Findings: No rash.   Neurological:     Mental Status: She is alert.   Psychiatric:        Mood and Affect: Mood is not anxious or depressed.        Speech: Speech normal.        Behavior: Behavior normal. Behavior is cooperative.        Thought Content: Thought content normal.        Judgment: Judgment normal.       Results for orders placed or performed in visit on 04/07/24  POCT Urinalysis Dipstick (Automated)   Collection Time: 04/07/24 11:38 AM  Result Value Ref Range   Color, UA Yellow    Clarity, UA Clear    Glucose, UA Negative Negative   Bilirubin, UA Negative    Ketones, UA Negative    Spec Grav, UA >=1.030 (A) 1.010 -  1.025   Blood, UA Trace    pH, UA 5.0 5.0 - 8.0   Protein, UA Positive (A) Negative   Urobilinogen, UA 0.2 0.2 or 1.0 E.U./dL   Nitrite, UA Negative    Leukocytes, UA Small (1+) (A) Negative  Urine Culture   Collection Time: 04/07/24 12:33 PM   Specimen: Urine  Result Value Ref Range   MICRO NUMBER: [id number]    SPECIMEN QUALITY: Adequate    Sample Source URINE    STATUS: FINAL    Result: No Growth     Assessment and Plan  Urinary frequency Assessment & Plan: Acute, symptoms only at night.  Urinalysis suggests against new diagnosis of diabetes or urinary infection.  Will send urine for culture to verify.  Discussed behavioral changes to decrease nocturia.  Orders: -     POCT Urinalysis Dipstick (Automated) -     Urine Culture  Gastroesophageal reflux disease with esophagitis without hemorrhage Assessment & Plan: Acute worsening, change current PPI to pantoprazole  40 mg daily.  Can continue low acidic food diet.  Work on [employer].   Class 2 obesity due to excess calories without serious comorbidity with body mass index (BMI) of 36.0 to 36.9 in adult Assessment & Plan: Chronic, discussed weight management options in detail.  Referred to medical bariatric weight loss center..  Orders: -     Amb Ref to Medical Weight Management  Generalized anxiety disorder Assessment & Plan: Chronic, poor control.  Encouraged her to continue counseling.  She refuses trial of SSRI.  She does state limited use of Ativan  is helpful and I have refilled this for her today.   Other orders -     Pantoprazole  Sodium; Take 1 tablet (40 mg total) by mouth 2 (two) times daily.  Dispense: 180 tablet; Refill: 3 -     Fluticasone  Propionate; Place 2 sprays into both nostrils daily as needed for allergies or rhinitis.  Dispense: 48 mL; Refill: 1 -     LORazepam ; Take 1 tablet (1 mg total) by mouth 2 (two) times daily. Prn vertigo or anxiety  Dispense: 10 tablet; Refill: 0    No follow-ups on  file.   Greig Ring, MD

## 2024-04-08 LAB — URINE CULTURE
MICRO NUMBER:: 16519972
Result:: NO GROWTH
SPECIMEN QUALITY:: ADEQUATE

## 2024-04-10 ENCOUNTER — Ambulatory Visit: Payer: Self-pay | Admitting: Family Medicine

## 2024-04-14 ENCOUNTER — Encounter: Payer: Self-pay | Admitting: Family Medicine

## 2024-04-30 ENCOUNTER — Encounter: Payer: Self-pay | Admitting: Emergency Medicine

## 2024-04-30 ENCOUNTER — Ambulatory Visit
Admission: EM | Admit: 2024-04-30 | Discharge: 2024-04-30 | Disposition: A | Discharge status: Home / Self Care | Attending: Emergency Medicine | Admitting: Emergency Medicine

## 2024-04-30 DIAGNOSIS — R6884 Jaw pain: Secondary | ICD-10-CM | POA: Diagnosis not present

## 2024-04-30 DIAGNOSIS — M542 Cervicalgia: Secondary | ICD-10-CM | POA: Diagnosis not present

## 2024-04-30 DIAGNOSIS — S46819A Strain of other muscles, fascia and tendons at shoulder and upper arm level, unspecified arm, initial encounter: Secondary | ICD-10-CM | POA: Diagnosis not present

## 2024-04-30 MED ORDER — PREDNISONE 10 MG (21) PO TBPK: ORAL_TABLET | Freq: Every day | ORAL | 0 refills | Status: AC

## 2024-04-30 MED ORDER — KETOROLAC TROMETHAMINE 30 MG/ML IJ SOLN
30.0000 mg | Freq: Once | INTRAMUSCULAR | Status: AC
  Administered 2024-04-30: 30 mg via INTRAMUSCULAR

## 2024-04-30 MED ORDER — CYCLOBENZAPRINE HCL 10 MG PO TABS: 10.0000 mg | ORAL_TABLET | Freq: Every day | ORAL | 0 refills | Status: AC

## 2024-04-30 NOTE — Discharge Instructions (Addendum)
 Your pain is most likely caused by irritation to the muscles, you believe the source of the symptoms is the neck radiating to the shoulder into the jaw, on exam there is no abnormality to the neck the lymph nodes or the ears  You may give an injection of Toradol  to help reduce inflammation and help with pain and ideally will start to see some improvement within the next hour  Start tomorrow take prednisone  every morning with food as directed  May take muscle relaxant at bedtime as needed for additional comfort  You may use heating pad in 15 minute intervals as needed for additional comfort  Begin massaging and stretching affected area daily for 10 minutes as tolerated to further loosen muscles   When lying down place pillow underneath and between knees for support  Can try sleeping without pillow on firm mattress   Practice good posture: head back, shoulders back, chest forward, pelvis back and weight distributed evenly on both legs  If pain persist after recommended treatment or reoccurs if may be beneficial to follow up with orthopedic specialist for evaluation, this doctor specializes in the bones and can manage your symptoms long-term with options such as but not limited to imaging, medications or physical therapy

## 2024-04-30 NOTE — ED Triage Notes (Signed)
 Patient reports pain in right ear, and  jaw  x 1 week.  Shoulder pain  and neck pain x 2 week. Patient denies injury. Patient reports took 1000 mg Po Tylenol  with no relief.

## 2024-04-30 NOTE — ED Provider Notes (Signed)
 [facility]    CSN: [id number] Arrival date & time: 04/30/24  1126      History   Chief Complaint Chief Complaint  Patient presents with   Otalgia   Shoulder Pain   Jaw Pain    HPI Lacey Decker is a 53 y.o. female.   Patient presents for evaluation of right sided neck pain and posterior shoulder pain beginning 3 weeks ago without precipitating event, injury or trauma.  Progressively worsening, has become constant described as a dull aching pain.  Has begun to experience right sided ear pain and right jaw pain, unsure if related, pain exacerbated when shoulder pain is triggered.  Has a bridge on the right lower gumline, denies redness, swelling or drainage from the area, no known need for dental work, able to tolerate food and liquids.  Denies congestion, sinus pressure, headaches, ear drainage or decreased hearing.  Has attempted use of Tylenol , Voltaren  gel and Biofreeze which while helpful has not resolved symptoms.  Past Medical History:  Diagnosis Date   Adenomyosis    Anxiety    Arthritis    Basal cell carcinoma 08/05/2021   R nasal sidewall, MOHs completed 09/23/21   Bipolar 1 disorder (HCC)    Chlamydia    as a teen   Dysplastic nevus 01/13/2010   left lat sacral, sacral, 0.4 x 0.3cm - DYSPLASTIC DYSPLASTIC JUNCTIONAL JUNCTIONAL NEVUS WITH MILD MELANOCYTIC MELANOCYTIC ATYPIA   Dysplastic nevus 01/13/2010   left ant axillary fold 0.3 cm - dysplastic compound nevus with mild melanocytic atypia   Endometriosis    Family history of adverse reaction to anesthesia    mother has mood changes after anesthesia   GERD (gastroesophageal reflux disease)    MRSA (methicillin resistant staph aureus) culture positive    PMDD (premenstrual dysphoric disorder)    Pneumonia    Pre-diabetes    SUI (stress urinary incontinence, female)     Patient Active Problem List   Diagnosis Date Noted   Acute recurrent maxillary sinusitis 11/05/2023   Osteoarthritis of right  hip, unspecified osteoarthritis type 11/30/2022   Dizziness 04/30/2022   Sore throat 04/14/2022   Strep pharyngitis 04/14/2022   Abnormal smell 03/13/2022   Tongue irritation 01/09/2022   Other chronic pain 01/09/2022   Osteoarthritis of right hip 10/28/2021   Lumbar radiculopathy, acute 09/02/2021   Piriformis syndrome of left side 09/02/2021   Trochanteric bursitis of left hip 09/02/2021   Prediabetes 04/22/2021   BMI 35.0-35.9,adult 04/22/2021   Palpitation 11/19/2020   Gastroesophageal reflux disease with esophagitis without hemorrhage    Esophageal dysphagia    Perimenopausal vasomotor symptoms 08/22/2019   Urinary incontinence without sensory awareness 02/08/2018   PMDD (premenstrual dysphoric disorder) 02/08/2018   SUI (stress urinary incontinence, female) 02/08/2018   Trapezius strain, left, initial encounter 01/18/2018   Medial epicondylitis of left elbow 07/17/2013   OBESITY, UNSPECIFIED 11/21/2010   Allergic rhinitis 11/21/2010   MIGRAINE, COMMON, INTRACTABLE 01/29/2009   MRSA 11/24/2008   VERTEBROBASILAR INSUFFICIENCY 07/08/2007   Bipolar disorder (HCC) 01/26/2007   Generalized anxiety disorder 01/06/2007   PANIC ATTACKS 01/06/2007   Erosive esophagitis 01/06/2007   DYSFUNCTIONAL UTERINE BLEEDING 01/06/2007   ROSACEA 01/06/2007    Past Surgical History:  Procedure Laterality Date   ABDOMINAL HYSTERECTOMY  2009   partial lap hyst, LSO due to adenomyosis/endometriosis   btl  11/09/1998   COLONOSCOPY WITH PROPOFOL  N/A 01/10/2020   Procedure: COLONOSCOPY WITH PROPOFOL ;  Surgeon: Unk Corinn Skiff, MD;  Location: [facility];  Service: Gastroenterology;  Laterality: N/A;   DILATION AND CURETTAGE OF UTERUS  1997   after miscarriage   ESOPHAGOGASTRODUODENOSCOPY (EGD) WITH PROPOFOL  N/A 01/10/2020   Procedure: ESOPHAGOGASTRODUODENOSCOPY (EGD) WITH PROPOFOL ;  Surgeon: Unk Corinn Skiff, MD;  Location: [facility];  Service: Gastroenterology;  Laterality: N/A;    LEEP  2000   misscarriage     x5   MOHS SURGERY     Nose   NEUROMA SURGERY     lipoma removal Right labia   nsvd     3   TOTAL HIP ARTHROPLASTY Right 11/30/2022   Procedure: TOTAL HIP ARTHROPLASTY;  Surgeon: Edna Toribio LABOR, MD;  Location: [facility];  Service: Orthopedics;  Laterality: Right;    OB History     Gravida  10   Para  3   Term  3   Preterm      AB  5   Living  3      SAB  5   IAB      Ectopic      Multiple      Live Births               Home Medications    Prior to Admission medications   Medication Sig Start Date End Date Taking? Authorizing Provider  cyclobenzaprine  (FLEXERIL ) 10 MG tablet Take 1 tablet (10 mg total) by mouth at bedtime. 04/30/24  Yes Arneisha Kincannon R, NP  predniSONE  (STERAPRED UNI-PAK 21 TAB) 10 MG (21) TBPK tablet Take by mouth daily. Take 6 tabs by mouth daily  for 1 days, then 5 tabs for 1 days, then 4 tabs for 1 days, then 3 tabs for 1 days, 2 tabs for 1 days, then 1 tab by mouth daily for 1 days 04/30/24  Yes Merie Wulf, Shelba SAUNDERS, NP  calcium  carbonate (TUMS - DOSED IN MG ELEMENTAL CALCIUM ) 500 MG chewable tablet Chew 3-4 tablets (1,500-2,000 mg total) by mouth daily as needed for indigestion or heartburn. 12/02/22   Edna Toribio LABOR, MD  esomeprazole  (NEXIUM ) 40 MG capsule Take 1 capsule (40 mg total) by mouth 2 (two) times daily before a meal. 09/20/23   Vanga, Corinn Skiff, MD  fluticasone  (FLONASE ) 50 MCG/ACT nasal spray Place 2 sprays into both nostrils daily as needed for allergies or rhinitis. 04/07/24   Bedsole, Amy E, MD  LORazepam  (ATIVAN ) 1 MG tablet Take 1 tablet (1 mg total) by mouth 2 (two) times daily. Prn vertigo or anxiety 04/07/24 04/07/25  Avelina Greig BRAVO, MD  pantoprazole  (PROTONIX ) 40 MG tablet Take 1 tablet (40 mg total) by mouth 2 (two) times daily. 04/07/24   Avelina Greig BRAVO, MD    Family History Family History  Problem Relation Age of Onset   Hypertension Mother    Hyperlipidemia Mother     Depression Mother    Basal cell carcinoma Mother    Colon polyps Mother    Bipolar disorder Sister    Depression Maternal Grandmother    Lung cancer Maternal Grandfather    Breast cancer Neg Hx     Social History Social History   Tobacco Use   Smoking status: Never   Smokeless tobacco: Never  Vaping Use   Vaping status: Never Used  Substance Use Topics   Alcohol use: No    Alcohol/week: 0.0 standard drinks of alcohol   Drug use: No     Allergies   Codeine, Morphine, Vancomycin , Clarithromycin, Erythromycin  base, Augmentin  [amoxicillin -pot clavulanate], and Sulfamethoxazole-trimethoprim   Review of Systems Review  of Systems  HENT:  Positive for ear pain.      Physical Exam Triage Vital Signs ED Triage Vitals  Encounter Vitals Group     BP 04/30/24 1222 138/71     Girls Systolic BP Percentile --      Girls Diastolic BP Percentile --      Boys Systolic BP Percentile --      Boys Diastolic BP Percentile --      Pulse Rate 04/30/24 1222 74     Resp 04/30/24 1222 18     Temp 04/30/24 1222 98.4 F (36.9 C)     Temp Source 04/30/24 1222 Oral     SpO2 04/30/24 1222 99 %     Weight --      Height --      Head Circumference --      Peak Flow --      Pain Score 04/30/24 1228 7     Pain Loc --      Pain Education --      Exclude from Growth Chart --    No data found.  Updated Vital Signs BP 138/71 (BP Location: Left Arm)   Pulse 74   Temp 98.4 F (36.9 C) (Oral)   Resp 18   SpO2 99%   Visual Acuity Right Eye Distance:   Left Eye Distance:   Bilateral Distance:    Right Eye Near:   Left Eye Near:    Bilateral Near:     Physical Exam Constitutional:      Appearance: Normal appearance.  HENT:     Right Ear: Tympanic membrane, ear canal and external ear normal.     Left Ear: Tympanic membrane, ear canal and external ear normal.     Mouth/Throat:     Pharynx: No oropharyngeal exudate or posterior oropharyngeal erythema.   Eyes:     Extraocular  Movements: Extraocular movements intact.   Neck:     Comments: Tenderness present to the base of the right lateral aspect of the neck, no ecchymosis swelling or deformity, able to complete full range of motion, 2+ carotid pulsePulmonary:     Effort: Pulmonary effort is normal.   Musculoskeletal:       Arms:     Comments: Tenderness present to the right upper shoulder blade without ecchymosis swelling or deformity, has full range of motion of the right shoulder without tenderness within the joint, 2+ brachial pulse strength is a 5 out of 5   Neurological:     Mental Status: She is alert and oriented to person, place, and time. Mental status is at baseline.      UC Treatments / Results  Labs (all labs ordered are listed, but only abnormal results are displayed) Labs Reviewed - No data to display  EKG   Radiology No results found.  Procedures Procedures (including critical care time)  Medications Ordered in UC Medications  ketorolac  (TORADOL ) 30 MG/ML injection 30 mg (30 mg Intramuscular Given 04/30/24 1306)    Initial Impression / Assessment and Plan / UC Course  I have reviewed the triage vital signs and the nursing notes.  Pertinent labs & imaging results that were available during my care of the patient were reviewed by me and considered in my medical decision making (see chart for details).  Neck pain, strain of trapezius muscle, jaw pain  Etiology most likely muscular, pain to the ear and jaw is most likely radiating from the neck, discussed with patient, no abnormality to the  ears or the oropharynx, low suspicion for infectious cause, Toradol  IM given and prescribed prednisone  and Flexeril  for home use recommended supportive care through [facility] and advised follow-up as needed Final Clinical Impressions(s) / UC Diagnoses   Final diagnoses:  Neck pain  Strain of trapezius muscle, initial encounter  Jaw pain     Discharge Instructions      Your pain is most  likely caused by irritation to the muscles, you believe the source of the symptoms is the neck radiating to the shoulder into the jaw, on exam there is no abnormality to the neck the lymph nodes or the ears  You may give an injection of Toradol  to help reduce inflammation and help with pain and ideally will start to see some improvement within the next hour  Start tomorrow take prednisone  every morning with food as directed  May take muscle relaxant at bedtime as needed for additional comfort  You may use heating pad in 15 minute intervals as needed for additional comfort  Begin massaging and stretching affected area daily for 10 minutes as tolerated to further loosen muscles   When lying down place pillow underneath and between knees for support  Can try sleeping without pillow on firm mattress   Practice good posture: head back, shoulders back, chest forward, pelvis back and weight distributed evenly on both legs  If pain persist after recommended treatment or reoccurs if may be beneficial to follow up with orthopedic specialist for evaluation, this doctor specializes in the bones and can manage your symptoms long-term with options such as but not limited to imaging, medications or physical therapy      ED Prescriptions     Medication Sig Dispense Auth. Provider   predniSONE  (STERAPRED UNI-PAK 21 TAB) 10 MG (21) TBPK tablet Take by mouth daily. Take 6 tabs by mouth daily  for 1 days, then 5 tabs for 1 days, then 4 tabs for 1 days, then 3 tabs for 1 days, 2 tabs for 1 days, then 1 tab by mouth daily for 1 days 21 tablet Kristian Hazzard R, NP   cyclobenzaprine  (FLEXERIL ) 10 MG tablet Take 1 tablet (10 mg total) by mouth at bedtime. 10 tablet [place] Katich R, NP      PDMP not reviewed this encounter.   Teresa Shelba SAUNDERS, NP 04/30/24 1313

## 2024-05-02 NOTE — Assessment & Plan Note (Signed)
Chronic, poor control.  Encouraged her to continue counseling.  She refuses trial of SSRI.  She does state limited use of Ativan is helpful and I have refilled this for her today.

## 2024-05-02 NOTE — Assessment & Plan Note (Signed)
 Acute worsening, change current PPI to pantoprazole  40 mg daily.  Can continue low acidic food diet.  Work on [employer].

## 2024-05-02 NOTE — Assessment & Plan Note (Signed)
 Acute, symptoms only at night.  Urinalysis suggests against new diagnosis of diabetes or urinary infection.  Will send urine for culture to verify.  Discussed behavioral changes to decrease nocturia.

## 2024-05-02 NOTE — Assessment & Plan Note (Signed)
 Chronic, discussed weight management options in detail.  Referred to medical bariatric weight loss center.SABRA

## 2024-05-19 ENCOUNTER — Ambulatory Visit
Admission: EM | Admit: 2024-05-19 | Discharge: 2024-05-19 | Disposition: A | Discharge status: Home / Self Care | Attending: Emergency Medicine | Admitting: Emergency Medicine

## 2024-05-19 ENCOUNTER — Telehealth: Payer: Self-pay

## 2024-05-19 DIAGNOSIS — K121 Other forms of stomatitis: Secondary | ICD-10-CM

## 2024-05-19 DIAGNOSIS — R110 Nausea: Secondary | ICD-10-CM | POA: Diagnosis not present

## 2024-05-19 DIAGNOSIS — J0100 Acute maxillary sinusitis, unspecified: Secondary | ICD-10-CM | POA: Diagnosis not present

## 2024-05-19 MED ORDER — LIDOCAINE VISCOUS HCL 2 % MT SOLN: 5.0000 mL | Freq: Four times a day (QID) | OROMUCOSAL | 0 refills | Status: AC | PRN

## 2024-05-19 MED ORDER — AMOXICILLIN 875 MG PO TABS: 875.0000 mg | ORAL_TABLET | Freq: Two times a day (BID) | ORAL | 0 refills | Status: AC

## 2024-05-19 MED ORDER — ONDANSETRON 4 MG PO TBDP: 4.0000 mg | ORAL_TABLET | Freq: Three times a day (TID) | ORAL | 0 refills | Status: AC | PRN

## 2024-05-19 NOTE — Telephone Encounter (Signed)
 Adrianna with [facility] calling;pt wants to be seen in office and no available appts at [facility] this afternoon and Adrianna wants me to schedule at different [facility] office. I spoke with pt; starting 2 - 3 days ago pt started with prod cough with green phlegm and other times dry cough; pt said both sides of her face hurts and her upper teeth hurt. Pt has sore on roof of mouth. Pt feels tired. No fever, H/A,dizziness,no wheezing and no CP. Pt has taken theraflu but that makes pt sick on stomach. No available appts this afternoon at any [facility] site. I offered pt appt at [facility] but that was too far and pt will go to [facility] as a walkin. Sending note to Dr Garwin and [place] pool.

## 2024-05-19 NOTE — ED Provider Notes (Signed)
 Lacey Decker    CSN: [id number] Arrival date & time: 05/19/24  1926      History   Chief Complaint Chief Complaint  Patient presents with   Cough   Facial Pain    HPI Lacey Decker is a 53 y.o. female.  Patient presents with 3-4 day history of sinus congestion, sinus pressure, sinus pain, cough.  She also has a painful mouth ulcer on the roof of her mouth x 2 days.  She also reports nausea but no vomiting or diarrhea.  She has been treating her symptoms with salt water  rinses, saline nasal spray, Zyrtec, TheraFlu, DayQuil.  No fever or shortness of breath.  The history is provided by the patient and medical records.    Past Medical History:  Diagnosis Date   Adenomyosis    Anxiety    Arthritis    Basal cell carcinoma 08/05/2021   R nasal sidewall, MOHs completed 09/23/21   Bipolar 1 disorder (HCC)    Chlamydia    as a teen   Dysplastic nevus 01/13/2010   left lat sacral, sacral, 0.4 x 0.3cm - DYSPLASTIC DYSPLASTIC JUNCTIONAL JUNCTIONAL NEVUS WITH MILD MELANOCYTIC MELANOCYTIC ATYPIA   Dysplastic nevus 01/13/2010   left ant axillary fold 0.3 cm - dysplastic compound nevus with mild melanocytic atypia   Endometriosis    Family history of adverse reaction to anesthesia    mother has mood changes after anesthesia   GERD (gastroesophageal reflux disease)    MRSA (methicillin resistant staph aureus) culture positive    PMDD (premenstrual dysphoric disorder)    Pneumonia    Pre-diabetes    SUI (stress urinary incontinence, female)     Patient Active Problem List   Diagnosis Date Noted   Acute recurrent maxillary sinusitis 11/05/2023   Osteoarthritis of right hip, unspecified osteoarthritis type 11/30/2022   Dizziness 04/30/2022   Sore throat 04/14/2022   Strep pharyngitis 04/14/2022   Abnormal smell 03/13/2022   Tongue irritation 01/09/2022   Other chronic pain 01/09/2022   Osteoarthritis of right hip 10/28/2021   Lumbar radiculopathy, acute 09/02/2021    Piriformis syndrome of left side 09/02/2021   Trochanteric bursitis of left hip 09/02/2021   Prediabetes 04/22/2021   BMI 35.0-35.9,adult 04/22/2021   Palpitation 11/19/2020   Gastroesophageal reflux disease with esophagitis without hemorrhage    Esophageal dysphagia    Perimenopausal vasomotor symptoms 08/22/2019   Urinary frequency 05/08/2018   Urinary incontinence without sensory awareness 02/08/2018   PMDD (premenstrual dysphoric disorder) 02/08/2018   SUI (stress urinary incontinence, female) 02/08/2018   Trapezius strain, left, initial encounter 01/18/2018   Medial epicondylitis of left elbow 07/17/2013   Obesity, unspecified 11/21/2010   Allergic rhinitis 11/21/2010   MIGRAINE, COMMON, INTRACTABLE 01/29/2009   MRSA 11/24/2008   VERTEBROBASILAR INSUFFICIENCY 07/08/2007   Bipolar disorder (HCC) 01/26/2007   Generalized anxiety disorder 01/06/2007   PANIC ATTACKS 01/06/2007   Erosive esophagitis 01/06/2007   DYSFUNCTIONAL UTERINE BLEEDING 01/06/2007   ROSACEA 01/06/2007    Past Surgical History:  Procedure Laterality Date   ABDOMINAL HYSTERECTOMY  2009   partial lap hyst, LSO due to adenomyosis/endometriosis   btl  11/09/1998   COLONOSCOPY WITH PROPOFOL  N/A 01/10/2020   Procedure: COLONOSCOPY WITH PROPOFOL ;  Surgeon: Unk Corinn Skiff, MD;  Location: [facility];  Service: Gastroenterology;  Laterality: N/A;   DILATION AND CURETTAGE OF UTERUS  1997   after miscarriage   ESOPHAGOGASTRODUODENOSCOPY (EGD) WITH PROPOFOL  N/A 01/10/2020   Procedure: ESOPHAGOGASTRODUODENOSCOPY (EGD) WITH PROPOFOL ;  Surgeon: Unk,  Corinn Skiff, MD;  Location: [facility];  Service: Gastroenterology;  Laterality: N/A;   LEEP  2000   misscarriage     x5   MOHS SURGERY     Nose   NEUROMA SURGERY     lipoma removal Right labia   nsvd     3   TOTAL HIP ARTHROPLASTY Right 11/30/2022   Procedure: TOTAL HIP ARTHROPLASTY;  Surgeon: Edna Toribio LABOR, MD;  Location: [facility];  Service:  Orthopedics;  Laterality: Right;    OB History     Gravida  10   Para  3   Term  3   Preterm      AB  5   Living  3      SAB  5   IAB      Ectopic      Multiple      Live Births               Home Medications    Prior to Admission medications   Medication Sig Start Date End Date Taking? Authorizing Provider  amoxicillin  (AMOXIL ) 875 MG tablet Take 1 tablet (875 mg total) by mouth 2 (two) times daily for 10 days. 05/19/24 05/29/24 Yes Corlis Burnard DEL, NP  magic mouthwash (lidocaine , diphenhydrAMINE , alum & mag hydroxide) suspension Swish and spit 5 mLs 4 (four) times daily as needed for up to 7 days for mouth pain. 05/19/24 05/26/24 Yes Corlis Burnard DEL, NP  ondansetron  (ZOFRAN -ODT) 4 MG disintegrating tablet Take 1 tablet (4 mg total) by mouth every 8 (eight) hours as needed for nausea or vomiting. 05/19/24  Yes Corlis Burnard DEL, NP  calcium  carbonate (TUMS - DOSED IN MG ELEMENTAL CALCIUM ) 500 MG chewable tablet Chew 3-4 tablets (1,500-2,000 mg total) by mouth daily as needed for indigestion or heartburn. 12/02/22   Edna Toribio LABOR, MD  cyclobenzaprine  (FLEXERIL ) 10 MG tablet Take 1 tablet (10 mg total) by mouth at bedtime. Patient not taking: Reported on 05/19/2024 04/30/24   Teresa Shelba SAUNDERS, NP  esomeprazole  (NEXIUM ) 40 MG capsule Take 1 capsule (40 mg total) by mouth 2 (two) times daily before a meal. Patient not taking: Reported on 05/19/2024 09/20/23   Unk Corinn Skiff, MD  fluticasone  (FLONASE ) 50 MCG/ACT nasal spray Place 2 sprays into both nostrils daily as needed for allergies or rhinitis. 04/07/24   Bedsole, Amy E, MD  LORazepam  (ATIVAN ) 1 MG tablet Take 1 tablet (1 mg total) by mouth 2 (two) times daily. Prn vertigo or anxiety 04/07/24 04/07/25  Avelina Greig BRAVO, MD  pantoprazole  (PROTONIX ) 40 MG tablet Take 1 tablet (40 mg total) by mouth 2 (two) times daily. 04/07/24   Bedsole, Amy E, MD  predniSONE  (STERAPRED UNI-PAK 21 TAB) 10 MG (21) TBPK tablet Take by mouth  daily. Take 6 tabs by mouth daily  for 1 days, then 5 tabs for 1 days, then 4 tabs for 1 days, then 3 tabs for 1 days, 2 tabs for 1 days, then 1 tab by mouth daily for 1 days Patient not taking: Reported on 05/19/2024 04/30/24   Teresa Shelba SAUNDERS, NP    Family History Family History  Problem Relation Age of Onset   Hypertension Mother    Hyperlipidemia Mother    Depression Mother    Basal cell carcinoma Mother    Colon polyps Mother    Bipolar disorder Sister    Depression Maternal Grandmother    Lung cancer Maternal Grandfather    Breast cancer Neg Hx  Social History Social History   Tobacco Use   Smoking status: Never   Smokeless tobacco: Never  Vaping Use   Vaping status: Never Used  Substance Use Topics   Alcohol use: No    Alcohol/week: 0.0 standard drinks of alcohol   Drug use: No     Allergies   Codeine, Morphine, Vancomycin , Clarithromycin, Erythromycin  base, Augmentin  [amoxicillin -pot clavulanate], and Sulfamethoxazole-trimethoprim   Review of Systems Review of Systems  Constitutional:  Positive for chills. Negative for fever.  HENT:  Positive for congestion, mouth sores, postnasal drip, rhinorrhea and sinus pressure. Negative for ear pain.   Respiratory:  Positive for cough. Negative for shortness of breath.   Gastrointestinal:  Positive for nausea. Negative for abdominal pain, diarrhea and vomiting.     Physical Exam Triage Vital Signs ED Triage Vitals  Encounter Vitals Group     BP      Girls Systolic BP Percentile      Girls Diastolic BP Percentile      Boys Systolic BP Percentile      Boys Diastolic BP Percentile      Pulse      Resp      Temp      Temp src      SpO2      Weight      Height      Head Circumference      Peak Flow      Pain Score      Pain Loc      Pain Education      Exclude from Growth Chart    No data found.  Updated Vital Signs BP 130/84   Pulse (!) 105   Temp 99 F (37.2 C)   Resp 18   SpO2 97%   Visual  Acuity Right Eye Distance:   Left Eye Distance:   Bilateral Distance:    Right Eye Near:   Left Eye Near:    Bilateral Near:     Physical Exam Constitutional:      General: She is not in acute distress. HENT:     Right Ear: Tympanic membrane normal.     Left Ear: Tympanic membrane normal.     Nose: Congestion and rhinorrhea present.     Mouth/Throat:     Mouth: Mucous membranes are moist.   Cardiovascular:     Rate and Rhythm: Normal rate and regular rhythm.     Heart sounds: Normal heart sounds.  Pulmonary:     Effort: Pulmonary effort is normal.     Breath sounds: Normal breath sounds.  Neurological:     Mental Status: She is alert.      UC Treatments / Results  Labs (all labs ordered are listed, but only abnormal results are displayed) Labs Reviewed - No data to display  EKG   Radiology No results found.  Procedures Procedures (including critical care time)  Medications Ordered in UC Medications - No data to display  Initial Impression / Assessment and Plan / UC Course  I have reviewed the triage vital signs and the nursing notes.  Pertinent labs & imaging results that were available during my care of the patient were reviewed by me and considered in my medical decision making (see chart for details).    Acute sinusitis, mouth ulcer, nausea.  Afebrile and vital signs are stable.  Lungs are clear and O2 sat is 97% on room air.  Treating today with amoxicillin  (patient states she is able to take  this without difficulty), Magic mouthwash, Zofran .  Tylenol  as needed.  Instructed patient to follow-up with her PCP on Monday.  Education provided on sinus infection and nausea.  ED precautions given.  Patient agrees to plan of care.  Final Clinical Impressions(s) / UC Diagnoses   Final diagnoses:  Acute non-recurrent maxillary sinusitis  Mouth ulcer  Nausea without vomiting     Discharge Instructions      Take the amoxicillin , Zofran , Magic mouthwash as  directed.  Follow-up with your primary care provider on Monday.  Go to the emergency department if you have worsening symptoms.     ED Prescriptions     Medication Sig Dispense Auth. Provider   amoxicillin  (AMOXIL ) 875 MG tablet Take 1 tablet (875 mg total) by mouth 2 (two) times daily for 10 days. 20 tablet Corlis Burnard DEL, NP   magic mouthwash (lidocaine , diphenhydrAMINE , alum & mag hydroxide) suspension Swish and spit 5 mLs 4 (four) times daily as needed for up to 7 days for mouth pain. 120 mL Corlis Burnard DEL, NP   ondansetron  (ZOFRAN -ODT) 4 MG disintegrating tablet Take 1 tablet (4 mg total) by mouth every 8 (eight) hours as needed for nausea or vomiting. 20 tablet Corlis Burnard DEL, NP      PDMP not reviewed this encounter.   Corlis Burnard DEL, NP 05/19/24 [phone]

## 2024-05-19 NOTE — ED Triage Notes (Addendum)
 Patient to Urgent Care with complaints of productive cough/ chest soreness/ sinus pain and pressure. Also reports a mouth ulcer or possible cut on the roof of her mouth.   Symptoms x2-3 days.   Using saline spray/ hot compress/ salt water  rinses/ zyrtec.

## 2024-05-19 NOTE — Discharge Instructions (Addendum)
 Take the amoxicillin , Zofran , Magic mouthwash as directed.  Follow-up with your primary care provider on Monday.  Go to the emergency department if you have worsening symptoms.

## 2024-06-18 NOTE — Progress Notes (Deleted)
 Office: [phone]  /  Fax: [phone]   Initial Visit    Lacey Decker was seen in clinic today to evaluate for obesity. She is interested in losing weight to improve overall health and reduce the risk of weight related complications. She presents today to review program treatment options, initial physical assessment, and evaluation.     She was referred by: {emreferby:28303}  When asked what else they would like to accomplish? She states: {EMHopetoaccomplish:28304::Adopt a healthier eating pattern and lifestyle,Improve energy levels and physical activity,Improve existing medical conditions,Improve quality of life}  When asked how has your weight affected you? She states: {EMWeightAffected:28305}  Weight history: ***  Highest weight: ***  Some associated conditions: {EMSomeConditions:28306}  Contributing factors: {EMcontributingfactors:28307}  Weight promoting medications identified: {EMWeightpromotingrx:28308}  Prior weight loss attempts: {emweightlossprograms:31590::None}  Current nutrition plan: {EMNutritionplan:28309::None}  Current level of physical activity: {EMcurrentPA:28310::None}  Current or previous pharmacotherapy: {EM previousRx:28311}  Response to medication: {EMResponsetomedication:28312}   Past medical history includes:   Past Medical History:  Diagnosis Date   Adenomyosis    Anxiety    Arthritis    Basal cell carcinoma 08/05/2021   R nasal sidewall, MOHs completed 09/23/21   Bipolar 1 disorder (HCC)    Chlamydia    as a teen   Dysplastic nevus 01/13/2010   left lat sacral, sacral, 0.4 x 0.3cm - DYSPLASTIC DYSPLASTIC JUNCTIONAL JUNCTIONAL NEVUS WITH MILD MELANOCYTIC MELANOCYTIC ATYPIA   Dysplastic nevus 01/13/2010   left ant axillary fold 0.3 cm - dysplastic compound nevus with mild melanocytic atypia   Endometriosis    Family history of adverse reaction to anesthesia    mother has mood changes after anesthesia   GERD  (gastroesophageal reflux disease)    MRSA (methicillin resistant staph aureus) culture positive    PMDD (premenstrual dysphoric disorder)    Pneumonia    Pre-diabetes    SUI (stress urinary incontinence, female)      Objective    There were no vitals taken for this visit. She was weighed on the bioimpedance scale: There is no height or weight on file to calculate BMI.  Body Fat%:***, Visceral Fat Rating:***, Weight trend over the last 12 months: {emweighttrend:28333}  General:  Alert, oriented and cooperative. Patient is in no acute distress.  Respiratory: Normal respiratory effort, no problems with respiration noted   Gait: able to ambulate independently  Mental Status: Normal mood and affect. Normal behavior. Normal judgment and thought content.   DIAGNOSTIC DATA REVIEWED:  BMET    Component Value Date/Time   NA 138 04/23/2023 1618   NA 141 08/22/2013 1719   NA 141 04/21/2012 1642   K 4.6 04/23/2023 1618   K 4.0 04/21/2012 1642   CL 101 04/23/2023 1618   CL 106 04/21/2012 1642   CO2 23 04/23/2023 1618   CO2 27 04/21/2012 1642   GLUCOSE 91 04/23/2023 1618   GLUCOSE 100 (H) 04/21/2012 1642   BUN 23 04/23/2023 1618   BUN 14 08/22/2013 1719   BUN 12 04/21/2012 1642   CREATININE 0.81 04/23/2023 1618   CALCIUM  9.9 04/23/2023 1618   CALCIUM  8.8 04/21/2012 1642   GFRNONAA >60 12/09/2022 0750   GFRNONAA >60 04/21/2012 1642   GFRAA >60 11/06/2018 1232   GFRAA >60 04/21/2012 1642   Lab Results  Component Value Date   HGBA1C 6.2 (H) 04/23/2023   HGBA1C 6.0 04/17/2021   No results found for: INSULIN CBC    Component Value Date/Time   WBC 7.7 04/23/2023 1630   RBC  4.46 04/23/2023 1630   HGB 13.8 04/23/2023 1630   HGB 12.9 12/20/2019 1325   HCT 41.3 04/23/2023 1630   HCT 38.1 12/20/2019 1325   PLT 354 04/23/2023 1630   PLT 294 12/20/2019 1325   MCV 92.6 04/23/2023 1630   MCV 98 (H) 12/20/2019 1325   MCV 102 (H) 04/21/2012 2012   MCH 30.9 04/23/2023 1630   MCHC  33.4 04/23/2023 1630   RDW 13.4 04/23/2023 1630   RDW 12.3 12/20/2019 1325   RDW 13.4 04/21/2012 2012   Iron/TIBC/Ferritin/ %Sat    Component Value Date/Time   IRON 86 12/20/2019 1325   TIBC 297 12/20/2019 1325   FERRITIN 172 (H) 12/20/2019 1325   IRONPCTSAT 29 12/20/2019 1325   Lipid Panel     Component Value Date/Time   CHOL 176 04/23/2023 1618   TRIG 106 04/23/2023 1618   HDL 48 (L) 04/23/2023 1618   CHOLHDL 3.7 04/23/2023 1618   VLDL 11.2 09/07/2022 0842   LDLCALC 108 (H) 04/23/2023 1618   Hepatic Function Panel     Component Value Date/Time   PROT 7.1 04/23/2023 1618   PROT 6.2 08/22/2013 1719   PROT 6.8 04/21/2012 1642   ALBUMIN  3.8 12/09/2022 0750   ALBUMIN  4.2 08/22/2013 1719   ALBUMIN  3.9 04/21/2012 1642   AST 23 04/23/2023 1618   AST 23 04/21/2012 1642   ALT 31 (H) 04/23/2023 1618   ALT 22 04/21/2012 1642   ALKPHOS 67 12/09/2022 0750   ALKPHOS 54 04/21/2012 1642   BILITOT 0.6 04/23/2023 1618   BILITOT 0.4 04/21/2012 1642   BILIDIR 0.09 01/08/2012 1634      Component Value Date/Time   TSH 0.98 04/23/2023 1630     Assessment and Plan   Gastroesophageal reflux disease with esophagitis without hemorrhage  Generalized anxiety disorder  Intertrigo  Prediabetes  Bipolar affective disorder, current episode hypomanic (HCC)  Erosive esophagitis  PANIC ATTACKS  ROSACEA  Urinary frequency  Perimenopausal vasomotor symptoms  Osteoarthritis of right hip, unspecified osteoarthritis type    Assessment and Plan Assessment & Plan         Obesity Treatment / Action Plan:  {EMobesityactionplanscribe:28314::Patient will work on garnering support from family and friends to begin weight loss journey.,Will work on eliminating or reducing the presence of highly palatable, calorie dense foods in the home.,Will complete provided nutritional and psychosocial assessment questionnaire before the next appointment.,Will be scheduled for indirect  calorimetry to determine resting energy expenditure in a fasting state.  This will allow us  to create a reduced calorie, high-protein meal plan to promote loss of fat mass while preserving muscle mass.,Counseled on the health benefits of losing 5%-15% of total body weight.,Was counseled on nutritional approaches to weight loss and benefits of reducing processed foods and consuming plant-based foods and high quality protein as part of nutritional weight management.,Was counseled on pharmacotherapy and role as an adjunct in weight management. }  Obesity Education Performed Today:  She was weighed on the bioimpedance scale and results were discussed and documented in the synopsis.  We discussed obesity as a disease and the importance of a more detailed evaluation of all the factors contributing to the disease.  We discussed the importance of long term lifestyle changes which include nutrition, exercise and behavioral modifications as well as the importance of customizing this to her specific health and social needs.  We discussed the benefits of reaching a healthier weight to alleviate the symptoms of existing conditions and reduce the risks of the biomechanical,  metabolic and psychological effects of obesity.  We reviewed the four pillars of obesity medicine and importance of using a multimodal approach.  We reviewed the basic principles in weight management.   Briunna A [place] appears to be in the action stage of change and states they are ready to start intensive lifestyle modifications and behavioral modifications.  I have spent *** minutes in the care of the patient today including: {NUMBER 1-10:22536} minutes before the visit reviewing and preparing the chart. *** minutes face-to-face {emfacetoface:32598::assessing and reviewing listed medical problems as outlined in obesity care plan,providing nutritional and behavioral counseling on topics outlined in the obesity care  plan,independently interpreting test results and goals of care, as described in assessment and plan,reviewing and discussing biometric information and progress} {NUMBER 1-10:22536} minutes after the visit updating chart and documentation of encounter.  Reviewed by clinician on day of visit: allergies, medications, problem list, medical history, surgical history, family history, social history, and previous encounter notes pertinent to obesity diagnosis.  Lyncoln Maskell,PA-C

## 2024-06-19 ENCOUNTER — Encounter (INDEPENDENT_AMBULATORY_CARE_PROVIDER_SITE_OTHER): Admitting: Physician Assistant

## 2024-06-19 ENCOUNTER — Encounter (INDEPENDENT_AMBULATORY_CARE_PROVIDER_SITE_OTHER): Payer: Self-pay | Admitting: Physician Assistant

## 2024-06-19 VITALS — BP 128/78 | HR 71 | Temp 98.1°F | Ht 65.0 in | Wt 218.0 lb

## 2024-06-19 DIAGNOSIS — F410 Panic disorder [episodic paroxysmal anxiety] without agoraphobia: Secondary | ICD-10-CM

## 2024-06-19 DIAGNOSIS — N951 Menopausal and female climacteric states: Secondary | ICD-10-CM

## 2024-06-19 DIAGNOSIS — L304 Erythema intertrigo: Secondary | ICD-10-CM

## 2024-06-19 DIAGNOSIS — F310 Bipolar disorder, current episode hypomanic: Secondary | ICD-10-CM

## 2024-06-19 DIAGNOSIS — K2100 Gastro-esophageal reflux disease with esophagitis, without bleeding: Secondary | ICD-10-CM

## 2024-06-19 DIAGNOSIS — L719 Rosacea, unspecified: Secondary | ICD-10-CM

## 2024-06-19 DIAGNOSIS — M1611 Unilateral primary osteoarthritis, right hip: Secondary | ICD-10-CM

## 2024-06-19 DIAGNOSIS — R7303 Prediabetes: Secondary | ICD-10-CM

## 2024-06-19 DIAGNOSIS — K2210 Ulcer of esophagus without bleeding: Secondary | ICD-10-CM

## 2024-06-19 DIAGNOSIS — R350 Frequency of micturition: Secondary | ICD-10-CM

## 2024-06-19 DIAGNOSIS — F411 Generalized anxiety disorder: Secondary | ICD-10-CM

## 2024-06-20 NOTE — Progress Notes (Signed)
 Patient decided not to go thru with consultation due to cost concerns   Sheryl Saintil,PA-C

## 2024-07-24 ENCOUNTER — Ambulatory Visit (INDEPENDENT_AMBULATORY_CARE_PROVIDER_SITE_OTHER): Admitting: Dermatology

## 2024-07-24 ENCOUNTER — Encounter: Payer: Self-pay | Admitting: Dermatology

## 2024-07-24 DIAGNOSIS — L700 Acne vulgaris: Secondary | ICD-10-CM

## 2024-07-24 DIAGNOSIS — L578 Other skin changes due to chronic exposure to nonionizing radiation: Secondary | ICD-10-CM | POA: Diagnosis not present

## 2024-07-24 DIAGNOSIS — L814 Other melanin hyperpigmentation: Secondary | ICD-10-CM

## 2024-07-24 DIAGNOSIS — W908XXA Exposure to other nonionizing radiation, initial encounter: Secondary | ICD-10-CM | POA: Diagnosis not present

## 2024-07-24 DIAGNOSIS — L821 Other seborrheic keratosis: Secondary | ICD-10-CM

## 2024-07-24 DIAGNOSIS — D229 Melanocytic nevi, unspecified: Secondary | ICD-10-CM

## 2024-07-24 DIAGNOSIS — Z86018 Personal history of other benign neoplasm: Secondary | ICD-10-CM

## 2024-07-24 DIAGNOSIS — D1801 Hemangioma of skin and subcutaneous tissue: Secondary | ICD-10-CM

## 2024-07-24 DIAGNOSIS — L719 Rosacea, unspecified: Secondary | ICD-10-CM

## 2024-07-24 DIAGNOSIS — Z85828 Personal history of other malignant neoplasm of skin: Secondary | ICD-10-CM

## 2024-07-24 DIAGNOSIS — Z1283 Encounter for screening for malignant neoplasm of skin: Secondary | ICD-10-CM

## 2024-07-24 MED ORDER — METRONIDAZOLE 0.75 % EX CREA: TOPICAL_CREAM | CUTANEOUS | 5 refills | Status: AC

## 2024-07-24 NOTE — Progress Notes (Addendum)
 Follow-Up Visit   Subjective  Lacey Decker is a 53 y.o. female who presents for the following: Skin Cancer Screening and Full Body Skin Exam. HxBCC, HxDN. AK Tx on R cheek has recurred.   Acne/Rosacea. Has had laser treatment here in the past.   The patient presents for Total-Body Skin Exam (TBSE) for skin cancer screening and mole check. The patient has spots, moles and lesions to be evaluated, some may be new or changing and the patient may have concern these could be cancer.    The following portions of the chart were reviewed this encounter and updated as appropriate: medications, allergies, medical history  Review of Systems:  No other skin or systemic complaints except as noted in HPI or Assessment and Plan.  Objective  Well appearing patient in no apparent distress; mood and affect are within normal limits.  A full examination was performed including scalp, head, eyes, ears, nose, lips, neck, chest, axillae, abdomen, back, buttocks, bilateral upper extremities, bilateral lower extremities, hands, feet, fingers, toes, fingernails, and toenails. All findings within normal limits unless otherwise noted below.   Relevant physical exam findings are noted in the Assessment and Plan.    Assessment & Plan   SKIN CANCER SCREENING PERFORMED TODAY.  HISTORY OF BASAL CELL CARCINOMA OF THE SKIN - No evidence of recurrence today - Recommend regular full body skin exams - Recommend daily broad spectrum sunscreen SPF 30+ to sun-exposed areas, reapply every 2 hours as needed.  - Call if any new or changing lesions are noted between office visits  HISTORY OF DYSPLASTIC NEVI No evidence of recurrence today Recommend regular full body skin exams Recommend daily broad spectrum sunscreen SPF 30+ to sun-exposed areas, reapply every 2 hours as needed.  Call if any new or changing lesions are noted between office visits   ACTINIC DAMAGE - Chronic condition, secondary to cumulative  UV/sun exposure - diffuse scaly erythematous macules with underlying dyspigmentation - Recommend daily broad spectrum sunscreen SPF 30+ to sun-exposed areas, reapply every 2 hours as needed.  - Staying in the shade or wearing long sleeves, sun glasses (UVA+UVB protection) and wide brim hats (4-inch brim around the entire circumference of the hat) are also recommended for sun protection.  - Call for new or changing lesions.  LENTIGINES, SEBORRHEIC KERATOSES, HEMANGIOMAS - Benign normal skin lesions - Benign-appearing - Call for any changes  MELANOCYTIC NEVI - Tan-brown and/or pink-flesh-colored symmetric macules and papules - Benign appearing on exam today - Observation - Call clinic for new or changing moles - Recommend daily use of broad spectrum spf 30+ sunscreen to sun-exposed areas.   AKs frozen last appointment have resolved   ROSACEA/ACNE Exam Mid face erythema with telangiectasias +/- scattered inflammatory papules/pustules infraorbital cheeks  Chronic and persistent condition with duration or expected duration over one year. Condition is bothersome/symptomatic for patient. Currently flared.   Rosacea is a chronic progressive skin condition usually affecting the face of adults, causing redness and/or acne bumps. It is treatable but not curable. It sometimes affects the eyes (ocular rosacea) as well. It may respond to topical and/or systemic medication and can flare with stress, sun exposure, alcohol, exercise, topical steroids (including hydrocortisone/cortisone 10) and some foods.  Daily application of broad spectrum spf 30+ sunscreen to face is recommended to reduce flares.   Treatment Plan Apply metronidazole  0.75% cream twice a day to affected areas at face for rosacea. 8 weeks of use before benefit   MULTIPLE BENIGN NEVI  LENTIGINES   ACTINIC ELASTOSIS   SEBORRHEIC KERATOSES   CHERRY ANGIOMA   ACNE VULGARIS   ROSACEA   Related  Medications metroNIDAZOLE  (METROCREAM ) 0.75 % cream Apply twice a day to affected areas at face for rosacea. Return in about 1 year (around 07/24/2025) for TBSE, HxBCC, HxDN.  I, Jill Parcell, CMA, am acting as scribe for Boneta Sharps, MD.   Documentation: I have reviewed the above documentation for accuracy and completeness, and I agree with the above.  Boneta Sharps, MD

## 2024-07-24 NOTE — Patient Instructions (Addendum)
 Recommend daily broad spectrum sunscreen SPF 30+ to sun-exposed areas, reapply every 2 hours as needed. Call for new or changing lesions.  Staying in the shade or wearing long sleeves, sun glasses (UVA+UVB protection) and wide brim hats (4-inch brim around the entire circumference of the hat) are also recommended for sun protection.     Apply metronidazole  0.75% cream twice a day to affected areas at face for rosacea.     Melanoma ABCDEs  Melanoma is the most dangerous type of skin cancer, and is the leading cause of death from skin disease.  You are more likely to develop melanoma if you: Have light-colored skin, light-colored eyes, or red or blond hair Spend a lot of time in the sun Tan regularly, either outdoors or in a tanning bed Have had blistering sunburns, especially during childhood Have a close family member who has had a melanoma Have atypical moles or large birthmarks  Early detection of melanoma is key since treatment is typically straightforward and cure rates are extremely high if we catch it early.   The first sign of melanoma is often a change in a mole or a new dark spot.  The ABCDE system is a way of remembering the signs of melanoma.  A for asymmetry:  The two halves do not match. B for border:  The edges of the growth are irregular. C for color:  A mixture of colors are present instead of an even brown color. D for diameter:  Melanomas are usually (but not always) greater than 6mm - the size of a pencil eraser. E for evolution:  The spot keeps changing in size, shape, and color.  Please check your skin once per month between visits. You can use a small mirror in front and a large mirror behind you to keep an eye on the back side or your body.   If you see any new or changing lesions before your next follow-up, please call to schedule a visit.  Please continue daily skin protection including broad spectrum sunscreen SPF 30+ to sun-exposed areas, reapplying every  2 hours as needed when you're outdoors.   Staying in the shade or wearing long sleeves, sun glasses (UVA+UVB protection) and wide brim hats (4-inch brim around the entire circumference of the hat) are also recommended for sun protection.       Due to recent changes in healthcare laws, you may see results of your pathology and/or laboratory studies on MyChart before the doctors have had a chance to review them. We understand that in some cases there may be results that are confusing or concerning to you. Please understand that not all results are received at the same time and often the doctors may need to interpret multiple results in order to provide you with the best plan of care or course of treatment. Therefore, we ask that you please give us  2 business days to thoroughly review all your results before contacting the office for clarification. Should we see a critical lab result, you will be contacted sooner.   If You Need Anything After Your Visit  If you have any questions or concerns for your doctor, please call our main line at [phone] and press option 4 to reach your doctor's medical assistant. If no one answers, please leave a voicemail as directed and we will return your call as soon as possible. Messages left after 4 pm will be answered the following business day.   You may also send us  a message via  MyChart. We typically respond to MyChart messages within 1-2 business days.  For prescription refills, please ask your pharmacy to contact our office. Our fax number is [phone].  If you have an urgent issue when the clinic is closed that cannot wait until the next business day, you can page your doctor at the number below.    Please note that while we do our best to be available for urgent issues outside of office hours, we are not available 24/7.   If you have an urgent issue and are unable to reach us , you may choose to seek medical care at your doctor's office, retail clinic,  urgent care center, or emergency room.  If you have a medical emergency, please immediately call 911 or go to the emergency department.  Pager Numbers  - Dr. Hester: [phone]  - Dr. Jackquline: [phone]  - Dr. Claudene: [phone]   - Dr. Raymund: [phone]  In the event of inclement weather, please call our main line at [phone] for an update on the status of any delays or closures.  Dermatology Medication Tips: Please keep the boxes that topical medications come in in order to help keep track of the instructions about where and how to use these. Pharmacies typically print the medication instructions only on the boxes and not directly on the medication tubes.   If your medication is too expensive, please contact our office at [phone] option 4 or send us  a message through MyChart.   We are unable to tell what your co-pay for medications will be in advance as this is different depending on your insurance coverage. However, we may be able to find a substitute medication at lower cost or fill out paperwork to get insurance to cover a needed medication.   If a prior authorization is required to get your medication covered by your insurance company, please allow us  1-2 business days to complete this process.  Drug prices often vary depending on where the prescription is filled and some pharmacies may offer cheaper prices.  The website www.goodrx.com contains coupons for medications through different pharmacies. The prices here do not account for what the cost may be with help from insurance (it may be cheaper with your insurance), but the website can give you the price if you did not use any insurance.  - You can print the associated coupon and take it with your prescription to the pharmacy.  - You may also stop by our office during regular business hours and pick up a GoodRx coupon card.  - If you need your prescription sent electronically to a different pharmacy, notify our  office through [facility] or by phone at [phone] option 4.     Si Usted Necesita Algo Despus de Su Visita  Tambin puede enviarnos un mensaje a travs de Clinical cytogeneticist. Por lo general respondemos a los mensajes de MyChart en el transcurso de 1 a 2 das hbiles.  Para renovar recetas, por favor pida a su farmacia que se ponga en contacto con nuestra oficina. Randi lakes de fax es [place] [phone].  Si tiene un asunto urgente cuando la clnica est cerrada y que no puede esperar hasta el siguiente da hbil, puede llamar/localizar a su doctor(a) al nmero que aparece a continuacin.   Por favor, tenga en cuenta que aunque hacemos todo lo posible para estar disponibles para asuntos urgentes fuera del horario de [place], no estamos disponibles las 24 horas del da, los 7 [address]  [address] de la [place].   Si  tiene un problema urgente y no puede comunicarse con nosotros, puede optar por buscar atencin mdica  en el consultorio de su doctor(a), en una clnica privada, en un centro de atencin urgente o en una sala de emergencias.  Si tiene Engineer, drilling, por favor llame inmediatamente al 911 o vaya a la sala de emergencias.  Nmeros de bper  - Dr. Hester: [phone]  - Dra. Jackquline: [phone]  - Dr. Claudene: [phone]  - Dra. Kitts: [phone]  En caso de inclemencias del [place], por favor llame a nuestra lnea principal al [phone] para una actualizacin sobre el estado de cualquier retraso o cierre.  Consejos para la medicacin en dermatologa: Por favor, guarde las cajas en las que vienen los medicamentos de uso tpico para ayudarle a seguir las instrucciones sobre dnde y cmo usarlos. Las farmacias generalmente imprimen las instrucciones del medicamento slo en las cajas y no directamente en los tubos del [place].   Si su medicamento es muy caro, por favor, pngase en contacto con landry rieger llamando al [phone] y presione la opcin 4 o envenos un  mensaje a travs de Clinical cytogeneticist.   No podemos decirle cul ser su copago por los medicamentos por adelantado ya que esto es diferente dependiendo de la cobertura de su seguro. Sin embargo, es posible que podamos encontrar un medicamento sustituto a Audiological scientist un formulario para que el seguro cubra el medicamento que se considera necesario.   Si se requiere una autorizacin previa para que su compaa de seguros malta su medicamento, por favor permtanos de 1 a 2 das hbiles para completar este proceso.  Los precios de los medicamentos varan con frecuencia dependiendo del Environmental consultant de dnde se surte la receta y alguna farmacias pueden ofrecer precios ms baratos.  El sitio web www.goodrx.com tiene cupones para medicamentos de Health and safety inspector. Los precios aqu no tienen en cuenta lo que podra costar con la ayuda del seguro (puede ser ms barato con su seguro), pero el sitio web puede darle el precio si no utiliz Tourist information centre manager.  - Puede imprimir el cupn correspondiente y llevarlo con su receta a la farmacia.  - Tambin puede pasar por nuestra oficina durante el horario de atencin regular y Education officer, museum una tarjeta de cupones de GoodRx.  - Si necesita que su receta se enve electrnicamente a una farmacia diferente, informe a nuestra oficina a travs de MyChart de [place] o por telfono llamando al [phone] y presione la opcin 4.

## 2024-08-15 ENCOUNTER — Ambulatory Visit: Payer: Self-pay

## 2024-08-15 NOTE — Telephone Encounter (Signed)
 Noted. Pt declined OV.

## 2024-08-15 NOTE — Telephone Encounter (Signed)
 FYI Only or Action Required?: Action required by provider: request for appointment.  Patient was last seen in primary care on 06/19/2024 by Rayburn, Elouise Phlegm, PA-C.  Called Nurse Triage reporting Cough.  Symptoms began several days ago.  Interventions attempted: OTC medications: dayquil .  Symptoms are: unchanged.  Triage Disposition: See Physician Within 24 Hours  Patient/caregiver understands and will follow disposition?: UnsureCopied from CRM #[id number]. Topic: Clinical - Red Word Triage >> Aug 15, 2024  3:48 PM Donna BRAVO wrote: Red Word that prompted transfer to Nurse Triage: patient has no insurance, just laid off  Patient has had the flu started 4 days ago, super week and tired, coughing fits, if she moves to much she gets dizzy, head and sinus pressure, congestion, Reason for Disposition  SEVERE coughing spells (e.g., whooping sound after coughing, vomiting after coughing)  Answer Assessment - Initial Assessment Questions Neg Covid home test on Saturday. Pt has tried DayQuil cold and sinus and Zofran . Pt has lost job and now without insurance. Pt does have a barky cough that causes dizziness during coughing attack. RN called [facility] to get quote of appt without insurance. Pt has declined office visit at this time and stated she will call back tomorrow if she feels the same/worse.      1. ONSET: When did the cough begin?      3-4 days ago  2. SEVERITY: How bad is the cough today?      Coughing fits 3. SPUTUM: Describe the color of your sputum (e.g., none, dry cough; clear, white, yellow, green)     green 4. HEMOPTYSIS: Are you coughing up any blood? If Yes, ask: How much? (e.g., flecks, streaks, tablespoons, etc.)     denies 5. DIFFICULTY BREATHING: Are you having difficulty breathing? If Yes, ask: How bad is it? (e.g., mild, moderate, severe)      denies 6. FEVER: Do you have a fever? If Yes, ask: What is your temperature, how was it measured, and when did  it start?     Not sure 7. CARDIAC HISTORY: Do you have any history of heart disease? (e.g., heart attack, congestive heart failure)      denies 8. LUNG HISTORY: Do you have any history of lung disease?  (e.g., pulmonary embolus, asthma, emphysema)     denies 9. PE RISK FACTORS: Do you have a history of blood clots? (or: recent major surgery, recent prolonged travel, bedridden)     na 10. OTHER SYMPTOMS: Do you have any other symptoms? (e.g., runny nose, wheezing, chest pain)  Fatigue, dizzy, sinus congestion, headache, nausea  Protocols used: Cough - Acute Non-Productive-A-AH

## 2024-08-21 ENCOUNTER — Encounter: Payer: 59 | Admitting: Dermatology

## 2024-09-08 ENCOUNTER — Other Ambulatory Visit: Payer: Self-pay | Admitting: Medical Genetics

## 2024-09-08 DIAGNOSIS — Z006 Encounter for examination for normal comparison and control in clinical research program: Secondary | ICD-10-CM

## 2024-11-17 ENCOUNTER — Ambulatory Visit
Admission: EM | Admit: 2024-11-17 | Discharge: 2024-11-17 | Disposition: A | Discharge status: Home / Self Care | Payer: Self-pay | Attending: Emergency Medicine | Admitting: Emergency Medicine

## 2024-11-17 DIAGNOSIS — J0100 Acute maxillary sinusitis, unspecified: Secondary | ICD-10-CM

## 2024-11-17 MED ORDER — AMOXICILLIN 875 MG PO TABS: 875.0000 mg | ORAL_TABLET | Freq: Two times a day (BID) | ORAL | 0 refills | Status: AC

## 2024-11-17 NOTE — Discharge Instructions (Signed)
 Take the amoxicillin as directed.  Follow up with your primary care provider if your symptoms are not improving.

## 2024-11-17 NOTE — ED Provider Notes (Signed)
 " [facility]    CSN: [id number] Arrival date & time: 11/17/24  1831      History   Chief Complaint Chief Complaint  Patient presents with   Nasal Congestion   Cough   Headache    HPI Lacey Decker is a 54 y.o. female.  Patient presents with 3-week history of sinus pressure, sinus pain, congestion, cough, headache.  Treatment attempted with various OTC cold and sinus medications without relief.  No fever or shortness of breath.  The history is provided by the patient and medical records.    Past Medical History:  Diagnosis Date   Adenomyosis    Anxiety    Arthritis    Basal cell carcinoma 08/05/2021   R nasal sidewall, MOHs completed 09/23/21   Bipolar 1 disorder (HCC)    Chlamydia    as a teen   Dysplastic nevus 01/13/2010   left lat sacral, sacral, 0.4 x 0.3cm - DYSPLASTIC DYSPLASTIC JUNCTIONAL JUNCTIONAL NEVUS WITH MILD MELANOCYTIC MELANOCYTIC ATYPIA   Dysplastic nevus 01/13/2010   left ant axillary fold 0.3 cm - dysplastic compound nevus with mild melanocytic atypia   Endometriosis    Family history of adverse reaction to anesthesia    mother has mood changes after anesthesia   GERD (gastroesophageal reflux disease)    MRSA (methicillin resistant staph aureus) culture positive    PMDD (premenstrual dysphoric disorder)    Pneumonia    Pre-diabetes    SUI (stress urinary incontinence, female)     Patient Active Problem List   Diagnosis Date Noted   Acute recurrent maxillary sinusitis 11/05/2023   Osteoarthritis of right hip, unspecified osteoarthritis type 11/30/2022   Dizziness 04/30/2022   Sore throat 04/14/2022   Strep pharyngitis 04/14/2022   Abnormal smell 03/13/2022   Tongue irritation 01/09/2022   Other chronic pain 01/09/2022   Osteoarthritis of right hip 10/28/2021   Lumbar radiculopathy, acute 09/02/2021   Piriformis syndrome of left side 09/02/2021   Trochanteric bursitis of left hip 09/02/2021   Prediabetes 04/22/2021   BMI  35.0-35.9,adult 04/22/2021   Palpitation 11/19/2020   Gastroesophageal reflux disease with esophagitis without hemorrhage    Esophageal dysphagia    Perimenopausal vasomotor symptoms 08/22/2019   Urinary frequency 05/08/2018   Urinary incontinence without sensory awareness 02/08/2018   PMDD (premenstrual dysphoric disorder) 02/08/2018   SUI (stress urinary incontinence, female) 02/08/2018   Trapezius strain, left, initial encounter 01/18/2018   Medial epicondylitis of left elbow 07/17/2013   Obesity, unspecified 11/21/2010   Allergic rhinitis 11/21/2010   MIGRAINE, COMMON, INTRACTABLE 01/29/2009   MRSA 11/24/2008   VERTEBROBASILAR INSUFFICIENCY 07/08/2007   Bipolar disorder (HCC) 01/26/2007   Generalized anxiety disorder 01/06/2007   PANIC ATTACKS 01/06/2007   Erosive esophagitis 01/06/2007   DYSFUNCTIONAL UTERINE BLEEDING 01/06/2007   ROSACEA 01/06/2007    Past Surgical History:  Procedure Laterality Date   ABDOMINAL HYSTERECTOMY  2009   partial lap hyst, LSO due to adenomyosis/endometriosis   btl  11/09/1998   COLONOSCOPY WITH PROPOFOL  N/A 01/10/2020   Procedure: COLONOSCOPY WITH PROPOFOL ;  Surgeon: Unk Corinn Skiff, MD;  Location: [facility] ENDOSCOPY;  Service: Gastroenterology;  Laterality: N/A;   DILATION AND CURETTAGE OF UTERUS  1997   after miscarriage   ESOPHAGOGASTRODUODENOSCOPY (EGD) WITH PROPOFOL  N/A 01/10/2020   Procedure: ESOPHAGOGASTRODUODENOSCOPY (EGD) WITH PROPOFOL ;  Surgeon: Unk Corinn Skiff, MD;  Location: [facility] ENDOSCOPY;  Service: Gastroenterology;  Laterality: N/A;   LEEP  2000   misscarriage     x5   MOHS  SURGERY     Nose   NEUROMA SURGERY     lipoma removal Right labia   nsvd     3   TOTAL HIP ARTHROPLASTY Right 11/30/2022   Procedure: TOTAL HIP ARTHROPLASTY;  Surgeon: Edna Toribio LABOR, MD;  Location: [facility];  Service: Orthopedics;  Laterality: Right;    OB History     Gravida  10   Para  3   Term  3   Preterm      AB  5   Living   3      SAB  5   IAB      Ectopic      Multiple      Live Births               Home Medications    Prior to Admission medications  Medication Sig Start Date End Date Taking? Authorizing Provider  amoxicillin  (AMOXIL ) 875 MG tablet Take 1 tablet (875 mg total) by mouth 2 (two) times daily for 10 days. 11/17/24 11/27/24 Yes Corlis Burnard DEL, NP  fluticasone  (FLONASE ) 50 MCG/ACT nasal spray Place 2 sprays into both nostrils daily as needed for allergies or rhinitis. 04/07/24  Yes Bedsole, Amy E, MD  pantoprazole  (PROTONIX ) 40 MG tablet Take 1 tablet (40 mg total) by mouth 2 (two) times daily. 04/07/24  Yes Bedsole, Amy E, MD  calcium  carbonate (TUMS - DOSED IN MG ELEMENTAL CALCIUM ) 500 MG chewable tablet Chew 3-4 tablets (1,500-2,000 mg total) by mouth daily as needed for indigestion or heartburn. 12/02/22   Edna Toribio LABOR, MD  cyclobenzaprine  (FLEXERIL ) 10 MG tablet Take 1 tablet (10 mg total) by mouth at bedtime. Patient not taking: Reported on 06/19/2024 04/30/24   Teresa Shelba SAUNDERS, NP  esomeprazole  (NEXIUM ) 40 MG capsule Take 1 capsule (40 mg total) by mouth 2 (two) times daily before a meal. Patient not taking: Reported on 06/19/2024 09/20/23   Unk Corinn Skiff, MD  LORazepam  (ATIVAN ) 1 MG tablet Take 1 tablet (1 mg total) by mouth 2 (two) times daily. Prn vertigo or anxiety 04/07/24 04/07/25  Avelina Greig BRAVO, MD  metroNIDAZOLE  (METROCREAM ) 0.75 % cream Apply twice a day to affected areas at face for rosacea. 07/24/24   Claudene Lehmann, MD  ondansetron  (ZOFRAN -ODT) 4 MG disintegrating tablet Take 1 tablet (4 mg total) by mouth every 8 (eight) hours as needed for nausea or vomiting. Patient not taking: Reported on 06/19/2024 05/19/24   Corlis Burnard DEL, NP  predniSONE  (STERAPRED UNI-PAK 21 TAB) 10 MG (21) TBPK tablet Take by mouth daily. Take 6 tabs by mouth daily  for 1 days, then 5 tabs for 1 days, then 4 tabs for 1 days, then 3 tabs for 1 days, 2 tabs for 1 days, then 1 tab by mouth  daily for 1 days Patient not taking: Reported on 06/19/2024 04/30/24   Teresa Shelba SAUNDERS, NP    Family History Family History  Problem Relation Age of Onset   Hypertension Mother    Hyperlipidemia Mother    Depression Mother    Basal cell carcinoma Mother    Colon polyps Mother    Bipolar disorder Sister    Depression Maternal Grandmother    Lung cancer Maternal Grandfather    Breast cancer Neg Hx     Social History Social History[1]   Allergies   Codeine, Morphine, Vancomycin , Clarithromycin, Erythromycin  base, Augmentin  [amoxicillin -pot clavulanate], and Sulfamethoxazole-trimethoprim   Review of Systems Review of Systems  Constitutional:  Negative for  chills and fever.  HENT:  Positive for congestion, postnasal drip, rhinorrhea, sinus pressure and sinus pain. Negative for ear pain and sore throat.   Respiratory:  Positive for cough. Negative for shortness of breath.      Physical Exam Triage Vital Signs ED Triage Vitals [11/17/24 1856]  Encounter Vitals Group     BP      Girls Systolic BP Percentile      Girls Diastolic BP Percentile      Boys Systolic BP Percentile      Boys Diastolic BP Percentile      Pulse      Resp      Temp      Temp src      SpO2      Weight      Height      Head Circumference      Peak Flow      Pain Score 7     Pain Loc      Pain Education      Exclude from Growth Chart    No data found.  Updated Vital Signs BP 139/84   Pulse 75   Temp 98 F (36.7 C)   Resp 20   SpO2 97%   Visual Acuity Right Eye Distance:   Left Eye Distance:   Bilateral Distance:    Right Eye Near:   Left Eye Near:    Bilateral Near:     Physical Exam Constitutional:      General: She is not in acute distress. HENT:     Right Ear: Tympanic membrane normal.     Left Ear: Tympanic membrane normal.     Nose: Congestion present.     Mouth/Throat:     Mouth: Mucous membranes are moist.     Pharynx: Oropharynx is clear.  Cardiovascular:      Rate and Rhythm: Normal rate and regular rhythm.     Heart sounds: Normal heart sounds.  Pulmonary:     Effort: Pulmonary effort is normal. No respiratory distress.     Breath sounds: Normal breath sounds.  Neurological:     Mental Status: She is alert.      UC Treatments / Results  Labs (all labs ordered are listed, but only abnormal results are displayed) Labs Reviewed - No data to display  EKG   Radiology No results found.  Procedures Procedures (including critical care time)  Medications Ordered in UC Medications - No data to display  Initial Impression / Assessment and Plan / UC Course  I have reviewed the triage vital signs and the nursing notes.  Pertinent labs & imaging results that were available during my care of the patient were reviewed by me and considered in my medical decision making (see chart for details).    Acute sinusitis.  Afebrile and vital signs are stable.  Lungs are clear and O2 sat is 97% on room air.  Patient has been symptom medic for 3 weeks and is not improving with OTC treatment.  Treating today with 10-day course of amoxicillin  which patient reports she has been able to take plain amoxicillin  without difficulty (allergy to Augmentin ).  Tylenol  or ibuprofen  as needed, plain Mucinex as needed.  Instructed patient to follow-up with her PCP if she is not improving.  She agrees to plan of care.  Final Clinical Impressions(s) / UC Diagnoses   Final diagnoses:  Acute non-recurrent maxillary sinusitis     Discharge Instructions      Take  the amoxicillin  as directed.  Follow-up with your primary care provider if your symptoms are not improving.      ED Prescriptions     Medication Sig Dispense Auth. Provider   amoxicillin  (AMOXIL ) 875 MG tablet Take 1 tablet (875 mg total) by mouth 2 (two) times daily for 10 days. 20 tablet Corlis Burnard DEL, NP      PDMP not reviewed this encounter.    [1]  Social History Tobacco Use   Smoking  status: Never   Smokeless tobacco: Never  Vaping Use   Vaping status: Never Used  Substance Use Topics   Alcohol use: No    Alcohol/week: 0.0 standard drinks of alcohol   Drug use: No     Corlis Burnard DEL, NP 11/17/24 1913  "

## 2024-11-17 NOTE — ED Triage Notes (Signed)
 Patient to Urgent Care with complaints of nasal congestion/ productive cough (green) / facial pain/ headaches.    Symptoms x3 weeks.   Meds: advil  cold and sinus

## 2025-07-24 ENCOUNTER — Ambulatory Visit: Admitting: Dermatology
